# Patient Record
Sex: Female | Born: 1935 | Race: White | Hispanic: No | Marital: Single | Smoking: Former smoker
Health system: Southern US, Community
[De-identification: ages and names within clinical notes are randomized; demographics above are authoritative.]

## PROBLEM LIST (undated history)

## (undated) DIAGNOSIS — R569 Unspecified convulsions: Secondary | ICD-10-CM

## (undated) DIAGNOSIS — M311 Thrombotic microangiopathy: Secondary | ICD-10-CM

## (undated) DIAGNOSIS — J9600 Acute respiratory failure, unspecified whether with hypoxia or hypercapnia: Secondary | ICD-10-CM

## (undated) DIAGNOSIS — T420X1A Poisoning by hydantoin derivatives, accidental (unintentional), initial encounter: Secondary | ICD-10-CM

## (undated) DIAGNOSIS — J189 Pneumonia, unspecified organism: Secondary | ICD-10-CM

## (undated) DIAGNOSIS — I639 Cerebral infarction, unspecified: Secondary | ICD-10-CM

## (undated) DIAGNOSIS — Z931 Gastrostomy status: Secondary | ICD-10-CM

## (undated) DIAGNOSIS — K922 Gastrointestinal hemorrhage, unspecified: Secondary | ICD-10-CM

## (undated) DIAGNOSIS — I959 Hypotension, unspecified: Secondary | ICD-10-CM

## (undated) DIAGNOSIS — I2699 Other pulmonary embolism without acute cor pulmonale: Secondary | ICD-10-CM

## (undated) DIAGNOSIS — M3119 Other thrombotic microangiopathy: Secondary | ICD-10-CM

## (undated) DIAGNOSIS — R413 Other amnesia: Secondary | ICD-10-CM

## (undated) DIAGNOSIS — J181 Lobar pneumonia, unspecified organism: Secondary | ICD-10-CM

## (undated) HISTORY — PX: JEJUNOSTOMY FEEDING TUBE: SUR737

## (undated) HISTORY — PX: APPENDECTOMY: SHX54

## (undated) HISTORY — DX: Other amnesia: R41.3

---

## 2014-05-13 ENCOUNTER — Other Ambulatory Visit (HOSPITAL_COMMUNITY): Payer: Self-pay | Admitting: Internal Medicine

## 2014-05-13 ENCOUNTER — Inpatient Hospital Stay (HOSPITAL_COMMUNITY)
Admission: EM | Admit: 2014-05-13 | Discharge: 2014-05-20 | DRG: 193 | Disposition: A | Discharge status: Skilled Nursing Facility | Payer: PRIVATE HEALTH INSURANCE | Attending: Internal Medicine | Admitting: Internal Medicine

## 2014-05-13 ENCOUNTER — Emergency Department (HOSPITAL_COMMUNITY): Payer: PRIVATE HEALTH INSURANCE

## 2014-05-13 ENCOUNTER — Ambulatory Visit (HOSPITAL_COMMUNITY): Payer: PRIVATE HEALTH INSURANCE

## 2014-05-13 ENCOUNTER — Encounter (HOSPITAL_COMMUNITY): Payer: Self-pay | Admitting: Emergency Medicine

## 2014-05-13 ENCOUNTER — Inpatient Hospital Stay (HOSPITAL_COMMUNITY): Payer: PRIVATE HEALTH INSURANCE

## 2014-05-13 DIAGNOSIS — R404 Transient alteration of awareness: Secondary | ICD-10-CM

## 2014-05-13 DIAGNOSIS — J90 Pleural effusion, not elsewhere classified: Secondary | ICD-10-CM | POA: Diagnosis present

## 2014-05-13 DIAGNOSIS — I69392 Facial weakness following cerebral infarction: Secondary | ICD-10-CM

## 2014-05-13 DIAGNOSIS — E871 Hypo-osmolality and hyponatremia: Secondary | ICD-10-CM | POA: Diagnosis present

## 2014-05-13 DIAGNOSIS — J181 Lobar pneumonia, unspecified organism: Secondary | ICD-10-CM

## 2014-05-13 DIAGNOSIS — I69320 Aphasia following cerebral infarction: Secondary | ICD-10-CM | POA: Diagnosis not present

## 2014-05-13 DIAGNOSIS — Z86711 Personal history of pulmonary embolism: Secondary | ICD-10-CM

## 2014-05-13 DIAGNOSIS — J189 Pneumonia, unspecified organism: Principal | ICD-10-CM | POA: Diagnosis present

## 2014-05-13 DIAGNOSIS — I69351 Hemiplegia and hemiparesis following cerebral infarction affecting right dominant side: Secondary | ICD-10-CM

## 2014-05-13 DIAGNOSIS — R197 Diarrhea, unspecified: Secondary | ICD-10-CM | POA: Diagnosis present

## 2014-05-13 DIAGNOSIS — D1800 Hemangioma unspecified site: Secondary | ICD-10-CM

## 2014-05-13 DIAGNOSIS — T420X1S Poisoning by hydantoin derivatives, accidental (unintentional), sequela: Secondary | ICD-10-CM

## 2014-05-13 DIAGNOSIS — T420X1A Poisoning by hydantoin derivatives, accidental (unintentional), initial encounter: Secondary | ICD-10-CM | POA: Diagnosis present

## 2014-05-13 DIAGNOSIS — E875 Hyperkalemia: Secondary | ICD-10-CM | POA: Diagnosis present

## 2014-05-13 DIAGNOSIS — R401 Stupor: Secondary | ICD-10-CM

## 2014-05-13 DIAGNOSIS — G9340 Encephalopathy, unspecified: Secondary | ICD-10-CM | POA: Diagnosis present

## 2014-05-13 DIAGNOSIS — T420X5A Adverse effect of hydantoin derivatives, initial encounter: Secondary | ICD-10-CM | POA: Diagnosis present

## 2014-05-13 DIAGNOSIS — T420X1D Poisoning by hydantoin derivatives, accidental (unintentional), subsequent encounter: Secondary | ICD-10-CM

## 2014-05-13 DIAGNOSIS — Z66 Do not resuscitate: Secondary | ICD-10-CM | POA: Diagnosis present

## 2014-05-13 DIAGNOSIS — R569 Unspecified convulsions: Secondary | ICD-10-CM

## 2014-05-13 DIAGNOSIS — T420X4D Poisoning by hydantoin derivatives, undetermined, subsequent encounter: Secondary | ICD-10-CM

## 2014-05-13 DIAGNOSIS — R1310 Dysphagia, unspecified: Secondary | ICD-10-CM

## 2014-05-13 DIAGNOSIS — I639 Cerebral infarction, unspecified: Secondary | ICD-10-CM | POA: Diagnosis present

## 2014-05-13 DIAGNOSIS — G40909 Epilepsy, unspecified, not intractable, without status epilepticus: Secondary | ICD-10-CM | POA: Diagnosis present

## 2014-05-13 DIAGNOSIS — R4182 Altered mental status, unspecified: Secondary | ICD-10-CM

## 2014-05-13 DIAGNOSIS — Z79899 Other long term (current) drug therapy: Secondary | ICD-10-CM | POA: Diagnosis not present

## 2014-05-13 HISTORY — DX: Poisoning by hydantoin derivatives, accidental (unintentional), initial encounter: T42.0X1A

## 2014-05-13 HISTORY — DX: Hypotension, unspecified: I95.9

## 2014-05-13 HISTORY — DX: Pneumonia, unspecified organism: J18.9

## 2014-05-13 HISTORY — DX: Cerebral infarction, unspecified: I63.9

## 2014-05-13 HISTORY — DX: Unspecified convulsions: R56.9

## 2014-05-13 HISTORY — DX: Gastrointestinal hemorrhage, unspecified: K92.2

## 2014-05-13 HISTORY — DX: Gastrostomy status: Z93.1

## 2014-05-13 HISTORY — DX: Other pulmonary embolism without acute cor pulmonale: I26.99

## 2014-05-13 LAB — COMPREHENSIVE METABOLIC PANEL
ALBUMIN: 2.8 g/dL — AB (ref 3.5–5.2)
ALT: 11 U/L (ref 0–35)
AST: 23 U/L (ref 0–37)
Alkaline Phosphatase: 377 U/L — ABNORMAL HIGH (ref 39–117)
Anion gap: 9 (ref 5–15)
BUN: 22 mg/dL (ref 6–23)
CO2: 30 meq/L (ref 19–32)
CREATININE: 0.59 mg/dL (ref 0.50–1.10)
Calcium: 8.6 mg/dL (ref 8.4–10.5)
Chloride: 85 mEq/L — ABNORMAL LOW (ref 96–112)
GFR calc Af Amer: >90 mL/min (ref >90)
GFR, EST NON AFRICAN AMERICAN: 87 mL/min — AB (ref >90)
Glucose, Bld: 115 mg/dL — ABNORMAL HIGH (ref 70–99)
Potassium: 5.5 mEq/L — ABNORMAL HIGH (ref 3.7–5.3)
SODIUM: 124 meq/L — AB (ref 137–147)
Total Bilirubin: 0.3 mg/dL (ref 0.3–1.2)
Total Protein: 6.8 g/dL (ref 6.0–8.3)

## 2014-05-13 LAB — URINALYSIS, ROUTINE W REFLEX MICROSCOPIC
Bilirubin Urine: NEGATIVE
Glucose, UA: NEGATIVE mg/dL
Hgb urine dipstick: NEGATIVE
Ketones, ur: NEGATIVE mg/dL
LEUKOCYTES UA: NEGATIVE
NITRITE: NEGATIVE
PH: 7 (ref 5.0–8.0)
Protein, ur: NEGATIVE mg/dL
Specific Gravity, Urine: 1.017 (ref 1.005–1.030)
UROBILINOGEN UA: 0.2 mg/dL (ref 0.0–1.0)

## 2014-05-13 LAB — CBC WITH DIFFERENTIAL/PLATELET
BASOS ABS: 0 10*3/uL (ref 0.0–0.1)
BASOS PCT: 0 % (ref 0–1)
Eosinophils Absolute: 0.1 10*3/uL (ref 0.0–0.7)
Eosinophils Relative: 1 % (ref 0–5)
HCT: 34.1 % — ABNORMAL LOW (ref 36.0–46.0)
Hemoglobin: 11.6 g/dL — ABNORMAL LOW (ref 12.0–15.0)
LYMPHS PCT: 27 % (ref 12–46)
Lymphs Abs: 2.7 10*3/uL (ref 0.7–4.0)
MCH: 28.5 pg (ref 26.0–34.0)
MCHC: 34 g/dL (ref 30.0–36.0)
MCV: 83.8 fL (ref 78.0–100.0)
MONO ABS: 1.3 10*3/uL — AB (ref 0.1–1.0)
Monocytes Relative: 13 % — ABNORMAL HIGH (ref 3–12)
NEUTROS ABS: 5.7 10*3/uL (ref 1.7–7.7)
Neutrophils Relative %: 59 % (ref 43–77)
PLATELETS: 436 10*3/uL — AB (ref 150–400)
RBC: 4.07 MIL/uL (ref 3.87–5.11)
RDW: 19.6 % — AB (ref 11.5–15.5)
WBC: 9.9 10*3/uL (ref 4.0–10.5)

## 2014-05-13 LAB — OSMOLALITY: Osmolality: 260 mOsm/kg — ABNORMAL LOW (ref 275–300)

## 2014-05-13 LAB — RAPID URINE DRUG SCREEN, HOSP PERFORMED
Amphetamines: NOT DETECTED
Barbiturates: NOT DETECTED
Benzodiazepines: NOT DETECTED
Cocaine: NOT DETECTED
OPIATES: NOT DETECTED
Tetrahydrocannabinol: NOT DETECTED

## 2014-05-13 LAB — I-STAT TROPONIN, ED: TROPONIN I, POC: 0 ng/mL (ref 0.00–0.08)

## 2014-05-13 LAB — PHENYTOIN LEVEL, TOTAL: PHENYTOIN LVL: 35.5 ug/mL — AB (ref 10.0–20.0)

## 2014-05-13 LAB — CBG MONITORING, ED: GLUCOSE-CAPILLARY: 108 mg/dL — AB (ref 70–99)

## 2014-05-13 LAB — SODIUM, URINE, RANDOM: SODIUM UR: 72 meq/L

## 2014-05-13 LAB — MRSA PCR SCREENING: MRSA by PCR: NEGATIVE

## 2014-05-13 LAB — LACTIC ACID, PLASMA: LACTIC ACID, VENOUS: 1.2 mmol/L (ref 0.5–2.2)

## 2014-05-13 LAB — VALPROIC ACID LEVEL

## 2014-05-13 LAB — PROCALCITONIN: Procalcitonin: <0.1 ng/mL

## 2014-05-13 MED ORDER — VALPROIC ACID 250 MG/5ML PO SYRP
500.0000 mg | ORAL_SOLUTION | Freq: Every day | ORAL | Status: DC
  Administered 2014-05-13 – 2014-05-16 (×4): 500 mg
  Filled 2014-05-13 (×6): qty 10

## 2014-05-13 MED ORDER — ONDANSETRON HCL 4 MG/2ML IJ SOLN: 4.0000 mg | Freq: Four times a day (QID) | INTRAMUSCULAR | Status: DC | PRN

## 2014-05-13 MED ORDER — SODIUM CHLORIDE 0.9 % IV SOLN: INTRAVENOUS | Status: AC

## 2014-05-13 MED ORDER — LORAZEPAM 1 MG PO TABS
1.0000 mg | ORAL_TABLET | Freq: Four times a day (QID) | ORAL | Status: DC | PRN
  Administered 2014-05-14: 1 mg
  Filled 2014-05-13: qty 1

## 2014-05-13 MED ORDER — ACETAMINOPHEN 650 MG RE SUPP: 650.0000 mg | Freq: Four times a day (QID) | RECTAL | Status: DC | PRN

## 2014-05-13 MED ORDER — CEFEPIME HCL 1 G IJ SOLR
1.0000 g | INTRAMUSCULAR | Status: DC
  Administered 2014-05-13 – 2014-05-14 (×2): 1 g via INTRAVENOUS
  Administered 2014-05-15: 22:00:00 via INTRAVENOUS
  Administered 2014-05-16 – 2014-05-18 (×3): 1 g via INTRAVENOUS
  Filled 2014-05-13 (×7): qty 1

## 2014-05-13 MED ORDER — ACETAMINOPHEN 325 MG PO TABS: 650.0000 mg | ORAL_TABLET | Freq: Four times a day (QID) | ORAL | Status: DC | PRN

## 2014-05-13 MED ORDER — VANCOMYCIN HCL 500 MG IV SOLR
500.0000 mg | Freq: Two times a day (BID) | INTRAVENOUS | Status: DC
  Administered 2014-05-13 – 2014-05-16 (×6): 500 mg via INTRAVENOUS
  Filled 2014-05-13 (×8): qty 500

## 2014-05-13 MED ORDER — SODIUM CHLORIDE 0.9 % IV BOLUS (SEPSIS)
1000.0000 mL | Freq: Once | INTRAVENOUS | Status: AC
  Administered 2014-05-13: 1000 mL via INTRAVENOUS

## 2014-05-13 MED ORDER — APIXABAN 5 MG PO TABS
5.0000 mg | ORAL_TABLET | Freq: Two times a day (BID) | ORAL | Status: DC
  Administered 2014-05-13 – 2014-05-16 (×6): 5 mg
  Filled 2014-05-13 (×7): qty 1

## 2014-05-13 MED ORDER — LACOSAMIDE 50 MG PO TABS
150.0000 mg | ORAL_TABLET | Freq: Two times a day (BID) | ORAL | Status: DC
  Administered 2014-05-13 – 2014-05-20 (×14): 150 mg
  Filled 2014-05-13 (×14): qty 3

## 2014-05-13 MED ORDER — LORAZEPAM 2 MG/ML IJ SOLN
1.0000 mg | INTRAMUSCULAR | Status: DC | PRN
  Administered 2014-05-16: 1 mg via INTRAVENOUS
  Filled 2014-05-13: qty 1

## 2014-05-13 MED ORDER — HEPARIN SODIUM (PORCINE) 5000 UNIT/ML IJ SOLN: 5000.0000 [IU] | Freq: Three times a day (TID) | INTRAMUSCULAR | Status: DC

## 2014-05-13 MED ORDER — ONDANSETRON HCL 4 MG PO TABS: 4.0000 mg | ORAL_TABLET | Freq: Four times a day (QID) | ORAL | Status: DC | PRN

## 2014-05-13 MED ORDER — SODIUM CHLORIDE 0.9 % IV SOLN
INTRAVENOUS | Status: DC
  Administered 2014-05-13 – 2014-05-18 (×7): via INTRAVENOUS

## 2014-05-13 MED ORDER — FAMOTIDINE 20 MG PO TABS
20.0000 mg | ORAL_TABLET | Freq: Two times a day (BID) | ORAL | Status: DC
  Administered 2014-05-13 – 2014-05-20 (×14): 20 mg
  Filled 2014-05-13 (×15): qty 1

## 2014-05-13 MED ORDER — ONDANSETRON HCL 4 MG PO TABS: 4.0000 mg | ORAL_TABLET | ORAL | Status: DC | PRN

## 2014-05-13 NOTE — Progress Notes (Signed)
Bedside routine EEG completed.  Results pending.

## 2014-05-13 NOTE — ED Notes (Signed)
IV team to bedside. 

## 2014-05-13 NOTE — Consult Note (Signed)
NEURO HOSPITALIST CONSULT NOTE    Reason for Consult: AMS in setting of supra therapeutic Dilantin and Hyponatremia  HPI:                                                                                                                                          Tanya White is an 78 y.o. female who resides at [facility].  History obtained from Niece. Per niece, two months ago patient suffered a seizure and was placed on AED. Two weeks later patient was noted to have a prolonged seizure and was again hospitalized.  At time of discharge she was placed on Vimpat , Depakote and Dilantin. Patient was discharged to [facility]. She was her baseline up until this past Thursday (Baseline = able to take two steps with full assist, right hemiplegia, dysarthria and some aphasia per niece).  On Friday she was noted to be non verbal and staring forward. MD at nursing home was concerned for seizure and ordered a EEG and CT of head to be done the following Monday.  Patient was picked up by EMS to go [facility] when they were concerned about patients breathing.  She was brought to [facility] ED.  Currently she is obtunded and only responds by localization to noxious stimuli.   Of note.  Depakote had been stopped on Friday due to concern it may have been causing her drowsiness.  Current Dilantin 53 and current NA+ 124  Past Medical History  Diagnosis Date  . Seizures   . Hypotension   . Pneumonia   . Stroke   . GI bleed   . Pulmonary embolism   . Gastrostomy tube in place     History reviewed. No pertinent past surgical history.   Family History: Unable to obtain  Social History:  has no tobacco, alcohol, and drug history on file.  Allergies  Allergen Reactions  . Aspirin Other (See Comments)    REACTION: unknown  . Codeine Other (See Comments)    REACTION: unknown  . Hctz [Hydrochlorothiazide] Other (See Comments)    REACTION: unknown  . Hydralazine  Other (See Comments)    REACTION: unknown  . Lipitor [Atorvastatin] Other (See Comments)    REACTION: unknown  . Reserpine Other (See Comments)    REACTION: unknown  . Tramadol Other (See Comments)    REACTION: unknown    MEDICATIONS:  No current facility-administered medications for this encounter.   Current Outpatient Prescriptions  Medication Sig Dispense Refill  . acetaminophen (TYLENOL) 325 MG tablet 650 mg by PEG Tube route every 6 (six) hours as needed for mild pain or fever.      Marland Kitchen albuterol (PROVENTIL) (2.5 MG/3ML) 0.083% nebulizer solution Take 2.5 mg by nebulization every 2 (two) hours as needed for wheezing or shortness of breath.      . Amino Acids-Protein Hydrolys (FEEDING SUPPLEMENT, PRO-STAT SUGAR FREE 64,) LIQD 30 mLs by PEG Tube route 2 (two) times daily.      Marland Kitchen apixaban (ELIQUIS) 5 MG TABS tablet 5 mg by PEG Tube route 2 (two) times daily.      . diphenhydrAMINE (BENADRYL) 12.5 MG/5ML liquid 25 mg by PEG Tube route every 6 (six) hours as needed (for pruritus and/or insomnia).      . divalproex (DEPAKOTE) 500 MG DR tablet 500 mg by PEG Tube route 2 (two) times daily.      . famotidine (PEPCID) 20 MG tablet 20 mg by PEG Tube route 2 (two) times daily.      . ferrous sulfate 325 (65 FE) MG tablet 325 mg by PEG Tube route daily.      . furosemide (LASIX) 40 MG tablet 40 mg by PEG Tube route daily as needed (for leg swelling.  Give PRN potassium when Lasix is given).      . Lacosamide (VIMPAT) 150 MG TABS 150 mg by PEG Tube route 2 (two) times daily.      Marland Kitchen LORazepam (ATIVAN) 1 MG tablet 1 mg by PEG Tube route every 6 (six) hours as needed (for agitation).      . LORazepam (ATIVAN) 2 MG tablet 2 mg by PEG Tube route every 6 (six) hours as needed for anxiety.      . magnesium oxide (MAG-OX) 400 MG tablet 400 mg by PEG Tube route 3 (three) times daily.      .  metoprolol tartrate (LOPRESSOR) 25 MG tablet 25 mg by PEG Tube route 2 (two) times daily.      . Multiple Vitamin (MULTIVITAMIN) LIQD 5 mLs by PEG Tube route daily.      . Nutritional Supplements (FEEDING SUPPLEMENT, JEVITY 1.5 CAL,) LIQD Place 1,000 mLs into feeding tube 3 (three) times daily as needed (given after meals, only if each meal intake is less than 50%).      Marland Kitchen ondansetron (ZOFRAN) 4 MG tablet 4 mg by PEG Tube route every 4 (four) hours as needed for nausea.      . potassium chloride 20 MEQ/15ML (10%) solution 20 mEq by PEG Tube route daily as needed (Adminiter with PRN Lasix.).      Marland Kitchen PRESCRIPTION MEDICATION Apply 1 mg topically every 6 (six) hours as needed (ATIVAN GEL for agitation).          ROS:  History obtained from unobtainable from patient due to mental status   Blood pressure 102/49, pulse 80, temperature 97.4 F (36.3 C), temperature source Axillary, resp. rate 26, height 5\' 5"  (1.651 m), weight 61.236 kg (135 lb), SpO2 100.00%.   Neurologic Examination:                                                                                                      General: obtunded Mental Status: Only responds to noxious stimuli and does localize to pain using her left arm>right Cranial Nerves: II: Discs flat bilaterally; no blink to threat bilaterally. , pupils equal, round, reactive to light and accommodation III,IV, VI: doll's response and corneal reflex intact V,VII: face with right facial droop, winces to pain bilaterally VIII: unable to assess IX,X: cough reflex present XI: bilateral shoulder shrug XII: tongue appears midline but unable to assess tongue protrusion due to mental status  Motor: Right : Upper extremity   3/5    Left:     Upper extremity   5/5  Lower extremity   2/5     Lower extremity   4/5 Tone and bulk:normal tone  throughout; no atrophy noted Sensory: withdraws from pain in all 4 extremites Deep Tendon Reflexes:  Right: Upper Extremity   Left: Upper extremity   biceps (C-5 to C-6) 2/4   biceps (C-5 to C-6) 2/4 tricep (C7) 2/4    triceps (C7) 2/4 Brachioradialis (C6) 2/4  Brachioradialis (C6) 2/4  Lower Extremity Lower Extremity  quadriceps (L-2 to L-4) 2/4   quadriceps (L-2 to L-4) 2/4 Achilles (S1) 2/4   Achilles (S1) 2/4  Plantars: Mute bilaterally Cerebellar: Unable to assess Gait: not tested CV: pulses palpable throughout    Lab Results: Basic Metabolic Panel:  Recent Labs Lab 05/13/14 1339  NA 124*  K 5.5*  CL 85*  CO2 30  GLUCOSE 115*  BUN 22  CREATININE 0.59  CALCIUM 8.6    Liver Function Tests:  Recent Labs Lab 05/13/14 1339  AST 23  ALT 11  ALKPHOS 377*  BILITOT 0.3  PROT 6.8  ALBUMIN 2.8*   No results found for this basename: LIPASE, AMYLASE,  in the last 168 hours No results found for this basename: AMMONIA,  in the last 168 hours  CBC:  Recent Labs Lab 05/13/14 1339  WBC 9.9  NEUTROABS 5.7  HGB 11.6*  HCT 34.1*  MCV 83.8  PLT 436*    Cardiac Enzymes: No results found for this basename: CKTOTAL, CKMB, CKMBINDEX, TROPONINI,  in the last 168 hours  Lipid Panel: No results found for this basename: CHOL, TRIG, HDL, CHOLHDL, VLDL, LDLCALC,  in the last 168 hours  CBG:  Recent Labs Lab 05/13/14 [place]*    Microbiology: No results found for this or any previous visit.  Coagulation Studies: No results found for this basename: LABPROT, INR,  in the last 72 hours  Imaging: Ct Head Wo Contrast  05/13/2014   CLINICAL DATA:  Altered mental status.  EXAM: CT HEAD WITHOUT CONTRAST  TECHNIQUE: Contiguous axial images were obtained from  the base of the skull through the vertex without intravenous contrast.  COMPARISON:  03/09/2014  FINDINGS: Area of encephalomalacia in the posterior left parietal lobe compatible with old infarct,  stable. Previously seen subdural hematomas have resolved. Mild chronic microvascular changes throughout the deep white matter. No acute hemorrhage or infarct. No hydrocephalus.  No acute calvarial abnormality. Visualized paranasal sinuses and mastoids clear. Orbital soft tissues unremarkable.  IMPRESSION: Fold of posterior left MCA infarct with encephalomalacia.  Mild chronic microvascular changes.  No acute intracranial abnormality.   Electronically Signed   By: Rolm Baptise M.D.   On: 05/13/2014 14:17   Dg Chest Port 1 View  05/13/2014   CLINICAL DATA:  Altered mental status  EXAM: PORTABLE CHEST - 1 VIEW  COMPARISON:  Portable exam 1408 hr compared to 02/17/2014  FINDINGS: Enlargement of cardiac silhouette.  Atherosclerotic calcification aorta.  Pulmonary vascularity normal.  Slight prominent RIGHT peritracheal soft tissues though this may be slightly accentuated versus previous study due to rotation to the LEFT.  Atelectasis versus consolidation of LEFT lower lobe.  Accompanying LEFT pleural effusion as well.  Ovoid nodular density versus superimposed clothing artifact at RIGHT base 19 x 14 mm.  RIGHT lung otherwise clear.  No pneumothorax.  Bones demineralized.  IMPRESSION: Enlargement of cardiac silhouette.  Atelectasis versus consolidation in LEFT lower lobe with associated LEFT pleural effusion.  Questionable nodular density RIGHT lung base versus superimposed artifact; followup upright PA and lateral chest radiographs recommended to further assess ; if patient is unable to perform upright imaging, an upright AP radiograph would then be recommended.   Electronically Signed   By: Lavonia Dana M.D.   On: 05/13/2014 14:25    Etta Quill PA-C [facility] [phone]  05/13/2014, 5:05 PM  Patient seen and examined.  Clinical course and management discussed.  Necessary edits performed.  I agree with the above.  Assessment and plan of care developed and discussed below.      Assessment/Plan: 78 YO female with AMS in setting of supra therapeutic Dilantin (corrected level of 53) and sodium of 124.  In addition CXR shows possible left consolidation.  Mental status likely secondary to these factors but this patient was recently hospitalized for status epilepticus and some of her medications were recently withdrawn.   Would like to rule out the possibility of a coexistent subclinical status epilepticus.  Head CT reviewed and shows no acute changes.  Old left MCA infarct apparent.    Recommendations: 1.  Hold Dilantin 2.  EEG tonight.  Technician notified 3.  Daily Dilantin levels 4.  Sodium correction 5.  Depakote and Vimpat to be restarted at preadmission doses 6.  Valproic acid level in AM on 9/30 7.  PRN Ativan for seizure activity   Alexis Goodell, MD [facility] [phone]  05/13/2014  6:01 PM

## 2014-05-13 NOTE — ED Notes (Signed)
Patient has wound on sacrum bandage applied prior to arrival to ED.

## 2014-05-13 NOTE — ED Notes (Signed)
EEG at bedside.

## 2014-05-13 NOTE — ED Notes (Signed)
EEG completed.

## 2014-05-13 NOTE — ED Notes (Signed)
EEG currently still in process.

## 2014-05-13 NOTE — H&P (Addendum)
History and Physical       Hospital Admission Note Date: 05/13/2014  Patient name: Tanya White Medical record number: [id number] Date of birth: 05/30/1936 Age: 78 y.o. Gender: female  PCP: [name], MD    Chief Complaint:  Confused, minimally responsive  HPI: Patient is a 78 year old female with known history of seizure disorder on multiple seizure medications, history of left MCA CVA,  dysphagia with PEG tube was recently admitted at [facility], discharged on 9/14 when she was admitted with status epilepticus. During the hospitalization, patient's seizure medications were uptitrated. She was found to have dysphagia and failed swallow screen, hence PEG tube was placed on 9/4. Blood cultures and urine cultures were negative. CTA chest on 9/3 showed a 2 small right lower lobe pulmonary embolus, echocardiogram showed small thrombus in the right atrium, patient was placed on Eliquis. At the facility, patient was scheduled to have CT of the head and EEG today due to cognitive dysfunction, the patient was brought from the nursing facility by the EMS once found minimally responsive. At the time of the encounter, patient opens eyes to pain otherwise unable to provide any history. Hospitalist service was requested for admission. No family member is present at the bedside.   Review of Systems:  Unable to obtain from the patient due to her mental status   Past Medical History: Past Medical History  Diagnosis Date  . Seizures   . Hypotension   . Pneumonia   . Stroke   . GI bleed   . Pulmonary embolism   . Gastrostomy tube in place    History reviewed. No pertinent past surgical history.  Medications: Prior to Admission medications   Not on File    Allergies:   Allergies  Allergen Reactions  . Aspirin Other (See Comments)    REACTION: unknown  . Codeine Other (See Comments)    REACTION: unknown  . Hctz  [Hydrochlorothiazide] Other (See Comments)    REACTION: unknown  . Hydralazine Other (See Comments)    REACTION: unknown  . Lipitor [Atorvastatin] Other (See Comments)    REACTION: unknown  . Reserpine Other (See Comments)    REACTION: unknown  . Tramadol Other (See Comments)    REACTION: unknown    Social History:  has no tobacco, alcohol, and drug history on file.  Family History: No family history on file.  Physical Exam: Blood pressure 127/61, pulse 71, temperature 97.4 F (36.3 C), temperature source Axillary, resp. rate 18, height 5\' 5"  (1.651 m), weight 61.236 kg (135 lb), SpO2 99.00%. General: Somnolent opens eyes to pain HEENT: normocephalic, atraumatic, anicteric sclera, pink conjunctiva, pupils equal and reactive to light and accomodation, oropharynx clear Neck: supple, no masses or lymphadenopathy, no goiter, no bruits  Heart: Regular rate and rhythm, without murmurs, rubs or gallops. Lungs: Decreased breath sounds at the bases, no wheezing Abdomen: Soft, nontender, nondistended, positive bowel sounds, PEG tube in place Extremities: No clubbing, cyanosis or edema with positive pedal pulses. Neuro: Does not follow commands Psych: Somnolent, opens eyes to pain Skin: no rashes or lesions, warm and dry   LABS on Admission:  Basic Metabolic Panel:  Recent Labs Lab 05/13/14 1339  NA 124*  K 5.5*  CL 85*  CO2 30  GLUCOSE 115*  BUN 22  CREATININE 0.59  CALCIUM 8.6   Liver Function Tests:  Recent Labs Lab 05/13/14 1339  AST 23  ALT 11  ALKPHOS 377*  BILITOT 0.3  PROT 6.8  ALBUMIN 2.8*  No results found for this basename: LIPASE, AMYLASE,  in the last 168 hours No results found for this basename: AMMONIA,  in the last 168 hours CBC:  Recent Labs Lab 05/13/14 1339  WBC 9.9  NEUTROABS 5.7  HGB 11.6*  HCT 34.1*  MCV 83.8  PLT 436*   Cardiac Enzymes: No results found for this basename: CKTOTAL, CKMB, CKMBINDEX, TROPONINI,  in the last 168  hours BNP: No components found with this basename: POCBNP,  CBG:  Recent Labs Lab 05/13/14 1402  GLUCAP 108*     Radiological Exams on Admission: Ct Head Wo Contrast  05/13/2014   CLINICAL DATA:  Altered mental status.  EXAM: CT HEAD WITHOUT CONTRAST  TECHNIQUE: Contiguous axial images were obtained from the base of the skull through the vertex without intravenous contrast.  COMPARISON:  03/09/2014  FINDINGS: Area of encephalomalacia in the posterior left parietal lobe compatible with old infarct, stable. Previously seen subdural hematomas have resolved. Mild chronic microvascular changes throughout the deep white matter. No acute hemorrhage or infarct. No hydrocephalus.  No acute calvarial abnormality. Visualized paranasal sinuses and mastoids clear. Orbital soft tissues unremarkable.  IMPRESSION: Fold of posterior left MCA infarct with encephalomalacia.  Mild chronic microvascular changes.  No acute intracranial abnormality.   Electronically Signed   By: Rolm Baptise M.D.   On: 05/13/2014 14:17   Dg Chest Port 1 View  05/13/2014   CLINICAL DATA:  Altered mental status  EXAM: PORTABLE CHEST - 1 VIEW  COMPARISON:  Portable exam 1408 hr compared to 02/17/2014  FINDINGS: Enlargement of cardiac silhouette.  Atherosclerotic calcification aorta.  Pulmonary vascularity normal.  Slight prominent RIGHT peritracheal soft tissues though this may be slightly accentuated versus previous study due to rotation to the LEFT.  Atelectasis versus consolidation of LEFT lower lobe.  Accompanying LEFT pleural effusion as well.  Ovoid nodular density versus superimposed clothing artifact at RIGHT base 19 x 14 mm.  RIGHT lung otherwise clear.  No pneumothorax.  Bones demineralized.  IMPRESSION: Enlargement of cardiac silhouette.  Atelectasis versus consolidation in LEFT lower lobe with associated LEFT pleural effusion.  Questionable nodular density RIGHT lung base versus superimposed artifact; followup upright PA and  lateral chest radiographs recommended to further assess ; if patient is unable to perform upright imaging, an upright AP radiograph would then be recommended.   Electronically Signed   By: Lavonia Dana M.D.   On: 05/13/2014 14:25    Assessment/Plan Principal Problem:   Acute encephalopathy unclear of the baseline, possibly worsened due to hyponatremia vs seizure activity vs Dilantin toxicity vs Left lower lobe consolidation  - Will admit to step down, obtain lactic acid, procalcitonin, pharmacy consult to reconcile her medications  - Per the nursing home records, patient is on Depakote, vimpat, Dilantin  - Patient has a prior history of CVA, will obtain EEG, discussed with neurology (Dr Doy Mince) for consult  - Keep n.p.o.  Active Problems:   Hyponatremia - Placed on IV fluids, obtain serum osmolarity, urine osmolarity, urine sodium - Repeat BMET  Left lower lobe pneumonia - Obtain urine legionella antigen, urine strept Ag, blood cultures, procalcitonin - Placed on IV vancomycin and cefepime  History of  Seizure: Currently with Dilantin toxicity - Will defer seizure medications to neurology    Hyperkalemia - Repeat BMET, will give one dose of Kayexalate - patient was receiving 20 mEq potassium daily outpatient, will discontinue     Dilantin toxicity - Hold the Dilantin, repeat level in am  Dysphagia - Patient is on Jevity 1.5, will place nutrition consult for tube feeding    history of recent PE, questionable cardiac thrombus per transfer records  - will need to restart Eliquis via Gtube  - Repeat 2-D echocardiogram  DVT prophylaxis: eliquis  CODE STATUS: DNR  Family Communication: no family members are present   Further plan will depend as patient's clinical course evolves and further radiologic and laboratory data become available.   Time Spent on Admission: 1 hour   Oprah Camarena M.D. [facility] 05/13/2014, 4:33 PM Pager: [phone]  If 7PM-7AM, please  contact night-coverage [email or web address] Password [email or web address]

## 2014-05-13 NOTE — ED Provider Notes (Signed)
CSN: [id number]     Arrival date & time 05/13/14  1331 History   First MD Initiated Contact with Patient 05/13/14 1336     Chief Complaint  Patient presents with  . Altered Mental Status     (Consider location/radiation/quality/duration/timing/severity/associated sxs/prior Treatment) Patient is a 78 y.o. female presenting with altered mental status. The history is provided by the EMS personnel (family).  Altered Mental Status Presenting symptoms: partial responsiveness   Severity:  Severe Most recent episode:  Today Episode history:  Continuous Duration:  2 weeks Timing:  Constant Progression:  Unchanged Chronicity:  Recurrent Context: nursing home resident    Pt is a 78 yo AAF with a PMH of seizure disorder (on depakote, dilantin and lacosamide), left MCA stroke (on eliquis), pneumonia s/p left thoracentesis for parapneumonic effusion and severe protein malnutrition who presents by EMS with altered mental status. Patient was at her nursing facility and EMS arrived to pick her up and found her to be minimally responsive (open eyes to pain). Patient non verbal. O2 sat 95% on 3L [place]. Given 0.4mg  narcan with no response. I spoke with family who states that the patient appears near her baseline for "the last couple of weeks." They report that she has been declining mentally for since her first seizure on June 2015.  Family believes patient has been getting her AEDs as prescribed since she has been at her nursing facility.   Past Medical History  Diagnosis Date  . Seizures   . Hypotension   . Pneumonia   . Stroke   . GI bleed   . Pulmonary embolism   . Gastrostomy tube in place    History reviewed. No pertinent past surgical history. No family history on file. History  Substance Use Topics  . Smoking status: Not on file  . Smokeless tobacco: Not on file  . Alcohol Use: Not on file   OB History   Grav Para Term Preterm Abortions TAB SAB Ect Mult Living                 Review  of Systems  Unable to perform ROS: Mental status change      Allergies  Aspirin; Codeine; Hctz; Hydralazine; Lipitor; Reserpine; and Tramadol  Home Medications   Prior to Admission medications   Not on File   BP 137/68  Pulse 80  Temp(Src) 97.4 F (36.3 C) (Axillary)  Resp 19  Ht 5\' 5"  (1.651 m)  Wt 135 lb (61.236 kg)  BMI 22.47 kg/m2  SpO2 100% Physical Exam  Constitutional: She appears well-developed. No distress.  No purposeful movements, snoring, no agonal respirations  HENT:  Head: Normocephalic and atraumatic.  Eyes: Pupils are equal, round, and reactive to light.  Pupils 46mm and sluggish  Neck: Neck supple. No JVD present.  Cardiovascular: Normal rate, regular rhythm and normal heart sounds.   No murmur heard. Pulmonary/Chest: She has decreased breath sounds in the left middle field and the left lower field. She has no wheezes.  Transmitted upper airway noises throughout chest  Abdominal: There is no tenderness. There is no rebound.  G tube in place with no surrounding erythema or purulence, abdomen soft, nondistended, nontender.  Neurological: She displays no tremor. She displays no seizure activity. She displays no Babinski's sign on the right side. She displays no Babinski's sign on the left side.  Reflex Scores:      Patellar reflexes are 2+ on the right side and 2+ on the left side. Opens eyes to  sternal rub, does not follow command or vocalize. Gait deferred  Skin: Skin is warm and dry. No rash noted.    ED Course  Procedures (including critical care time) Angiocath insertion Performed by: Gustavus Bryant  Consent: Verbal consent obtained. Risks and benefits: risks, benefits and alternatives were discussed Time out: Immediately prior to procedure a "time out" was called to verify the correct patient, procedure, equipment, support staff and site/side marked as required.  Preparation: Patient was prepped and draped in the usual sterile fashion.  Vein  Location: Right upper arm  Ultrasound Guided  Gauge: 18  Normal blood return and flush without difficulty Patient tolerance: Patient tolerated the procedure well with no immediate complications.    Labs Review Labs Reviewed  CBC WITH DIFFERENTIAL - Abnormal; Notable for the following:    Hemoglobin 11.6 (*)    HCT 34.1 (*)    RDW 19.6 (*)    Platelets 436 (*)    Monocytes Relative 13 (*)    Monocytes Absolute 1.3 (*)    All other components within normal limits  COMPREHENSIVE METABOLIC PANEL - Abnormal; Notable for the following:    Sodium 124 (*)    Potassium 5.5 (*)    Chloride 85 (*)    Glucose, Bld 115 (*)    Albumin 2.8 (*)    Alkaline Phosphatase 377 (*)    GFR calc non Af Amer 87 (*)    All other components within normal limits  VALPROIC ACID LEVEL - Abnormal; Notable for the following:    Valproic Acid Lvl <10.0 (*)    All other components within normal limits  PHENYTOIN LEVEL, TOTAL - Abnormal; Notable for the following:    Phenytoin Lvl 35.5 (*)    All other components within normal limits  CBG MONITORING, ED - Abnormal; Notable for the following:    Glucose-Capillary 108 (*)    All other components within normal limits  URINALYSIS, ROUTINE W REFLEX MICROSCOPIC  URINE RAPID DRUG SCREEN (HOSP PERFORMED)  I-STAT TROPOININ, ED    Imaging Review  EXAM: XR CHEST PORTABLE ACCESSION NO. [id number] UN STUDY DATE: 03/22/14 at 11:54 AM. INDICATION: Patient is a 78 year-old F; - , LINE CHECK (CATHETER VASCULAR FIT),  COMPARISON: Images from left subclavian line placement, 03/12/14; chest radiographs, 03/11/14, and earlier. CONCLUSIONS: 1. Interval retraction of left subclavian central venous catheter, with tip now at mid brachiocephalic vein. 2. No pneumothoraces or sizable effusions. Cardiomediastinal silhouette unchanged. 3. Improved lung inflation since prior chest radiograph, and decrease in bibasilar opacities, likely atelectasis. Dr. Cato Mulligan was notified  of the above findings at 13:15:36 on 03/22/14 by Dr. Julio Sicks via telephone.    DG Chest Port 1 View (Final result)  Result time: 05/13/14 14:25:30    Procedure changed from [facility] Chest Portable 2 Views    Final result by Rad Results In Interface (05/13/14 14:25:30)    Narrative:   CLINICAL DATA: Altered mental status  EXAM: PORTABLE CHEST - 1 VIEW  COMPARISON: Portable exam 1408 hr compared to 02/17/2014  FINDINGS: Enlargement of cardiac silhouette.  Atherosclerotic calcification aorta.  Pulmonary vascularity normal.  Slight prominent RIGHT peritracheal soft tissues though this may be slightly accentuated versus previous study due to rotation to the LEFT.  Atelectasis versus consolidation of LEFT lower lobe.  Accompanying LEFT pleural effusion as well.  Ovoid nodular density versus superimposed clothing artifact at RIGHT base 19 x 14 mm.  RIGHT lung otherwise clear.  No pneumothorax.  Bones demineralized.  IMPRESSION: Enlargement of  cardiac silhouette.  Atelectasis versus consolidation in LEFT lower lobe with associated LEFT pleural effusion.  Questionable nodular density RIGHT lung base versus superimposed artifact; followup upright PA and lateral chest radiographs recommended to further assess ; if patient is unable to perform upright imaging, an upright AP radiograph would then be recommended.   Electronically Signed By: Lavonia Dana M.D. On: 05/13/2014 14:25    Ct Head Wo Contrast  05/13/2014   CLINICAL DATA:  Altered mental status.  EXAM: CT HEAD WITHOUT CONTRAST  TECHNIQUE: Contiguous axial images were obtained from the base of the skull through the vertex without intravenous contrast.  COMPARISON:  03/09/2014  FINDINGS: Area of encephalomalacia in the posterior left parietal lobe compatible with old infarct, stable. Previously seen subdural hematomas have resolved. Mild chronic microvascular changes throughout the deep white matter. No acute  hemorrhage or infarct. No hydrocephalus.  No acute calvarial abnormality. Visualized paranasal sinuses and mastoids clear. Orbital soft tissues unremarkable.  IMPRESSION: Fold of posterior left MCA infarct with encephalomalacia.  Mild chronic microvascular changes.  No acute intracranial abnormality.   Electronically Signed   By: Rolm Baptise M.D.   On: 05/13/2014 14:17     EKG Interpretation   Date/Time:  Monday May 13 2014 13:34:18 EDT Ventricular Rate:  79 PR Interval:  154 QRS Duration: 107 QT Interval:  398 QTC Calculation: 456 R Axis:   -177 Text Interpretation:  Sinus rhythm Rightward axis Probable right  ventricular hypertrophy Nonspecific T wave abnormality No previous tracing  Confirmed by Ashok Cordia  MD, Lennette Bihari ([id number]) on 05/13/2014 1:55:58 PM      MDM   Final diagnoses:  Hyponatremia  Stupor   79 yo F with a pmh of seizure d/o and CVA who present with AMS. Opens eyes to painful stimulus. Does not follow commands or vocalize. Spontaneous respirations. O2 sats >95% on 3L [place]. CT head obtained and is negative for acute abnormality, but reveals evidence of prior infarct. CXR with cardiomegaly and left pleural effusion. FSG 108. HB 11.6.  Na 124 (in Care Everywhere pt's Na 140 on 04/01/14).  Dilantin level high. Depakote level low. Will have patient admitted to Hospitalist service stepdown unit for AMS with hyponatremia.  Patient transitioned to 2L [place] with O2 sat 98%.  Case discussed with Dr. Ashok Cordia.  Gustavus Bryant, MD      Gustavus Bryant, MD 05/13/14 [phone]

## 2014-05-13 NOTE — Progress Notes (Signed)
ANTIBIOTIC CONSULT NOTE - INITIAL  Pharmacy Consult for vancomycin and cefepime Indication: pneumonia  Allergies  Allergen Reactions  . Aspirin Other (See Comments)    REACTION: unknown  . Codeine Other (See Comments)    REACTION: unknown  . Hctz [Hydrochlorothiazide] Other (See Comments)    REACTION: unknown  . Hydralazine Other (See Comments)    REACTION: unknown  . Lipitor [Atorvastatin] Other (See Comments)    REACTION: unknown  . Reserpine Other (See Comments)    REACTION: unknown  . Tramadol Other (See Comments)    REACTION: unknown    Patient Measurements: Height: 5\' 5"  (165.1 cm) Weight: 135 lb (61.236 kg) IBW/kg (Calculated) : 57   Vital Signs: Temp: 97.4 F (36.3 C) (09/28 1400) Temp src: Axillary (09/28 1400) BP: 124/63 mmHg (09/28 1915) Pulse Rate: 89 (09/28 1915) Intake/Output from previous day:   Intake/Output from this shift:    Labs:  Recent Labs  05/13/14 1339  WBC 9.9  HGB 11.6*  PLT 436*  CREATININE 0.59   Estimated Creatinine Clearance: 53.8 ml/min (by C-G formula based on Cr of 0.59). No results found for this basename: VANCOTROUGH, VANCOPEAK, VANCORANDOM, GENTTROUGH, GENTPEAK, GENTRANDOM, TOBRATROUGH, TOBRAPEAK, TOBRARND, AMIKACINPEAK, AMIKACINTROU, AMIKACIN,  in the last 72 hours   Microbiology: No results found for this or any previous visit (from the past 720 hour(s)).  Medical History: Past Medical History  Diagnosis Date  . Seizures   . Hypotension   . Pneumonia   . Stroke   . GI bleed   . Pulmonary embolism   . Gastrostomy tube in place     Medications:  See med rec   Assessment: Patient is a 78 y.o F presented to the ED from [facility] secondary to AMS.  To start vancomycin and cefepime for PNA. Scr 0.59 (crcl~46-54)  Goal of Therapy:  Vancomycin trough level 15-20 mcg/ml  Plan:  1) change to cefepime 1gm IV q24h 2) vancomycin 500mg  Iv q12h  Jackey Housey P 05/13/2014,7:25 PM

## 2014-05-13 NOTE — Procedures (Signed)
EEG report.   Clinical summary: 78 YO female with AMS in setting of supra therapeutic Dilantin (corrected level of 53) and sodium of 124. In addition CXR shows possible left consolidation. Mental status likely secondary to these factors but this patient was recently hospitalized for status epilepticus and some of her medications were recently withdrawn. Rule out the possibility of a coexistent subclinical status epilepticus  Technique: this is a 17 channel routine scalp EEG performed at the bedside with bipolar and monopolar montages arranged in accordance to the international 10/20 system of electrode placement. One channel was dedicated to EKG recording.  The study was performed during wakefulness, drowsiness, and stage 2 sleep. No activating procedures performed.  Description:In the wakeful state, the best background consisted of a medium amplitude, posterior dominant, poorly sustained, symmetric and reactive 7 to 8 Hz rhythm. Drowsiness demonstrated dropout of the alpha rhythm. Stage 2 sleep showed symmetric and synchronous sleep spindles without intermixed epileptiform discharges. No focal or generalized epileptiform discharges noted.  No pathologic areas of slowing seen.  EKG showed sinus rhythm.  Impression: this is a mildly abnormal awake and asleep EEG with findings consistent with a mild encephalopathy, non specific. No electrographic seizures noted.  Clinical correlation is advised.   Dorian Pod, MD

## 2014-05-13 NOTE — ED Notes (Signed)
Patient family at bedside speaking with Doctor.

## 2014-05-13 NOTE — ED Notes (Addendum)
Patient transported from [facility] hospital to [place] home on Friday. Staff reported in this same condition since then history of 90 minute seizure prior to [facility].  Doctor called [facility] for CT head and EEG outpatient. [facility] arrived called [facility] to transport to ED for evaluation. Patient responds to painful stimuli en route and arrival to ED. [facility] gave Narcan 2mg  and [facility] gave additional narcan 2mg  no change.

## 2014-05-13 NOTE — ED Notes (Signed)
Patient intermittent moves extremities left arm more then the right. Right arm minimally moves along with bilateral lower extremities.

## 2014-05-13 NOTE — ED Notes (Signed)
EKG completed and given to EDP.  

## 2014-05-14 DIAGNOSIS — T6591XS Toxic effect of unspecified substance, accidental (unintentional), sequela: Secondary | ICD-10-CM

## 2014-05-14 DIAGNOSIS — Z5189 Encounter for other specified aftercare: Secondary | ICD-10-CM

## 2014-05-14 DIAGNOSIS — T50901S Poisoning by unspecified drugs, medicaments and biological substances, accidental (unintentional), sequela: Secondary | ICD-10-CM

## 2014-05-14 DIAGNOSIS — R569 Unspecified convulsions: Secondary | ICD-10-CM

## 2014-05-14 LAB — COMPREHENSIVE METABOLIC PANEL
ALBUMIN: 3 g/dL — AB (ref 3.5–5.2)
ALT: 10 U/L (ref 0–35)
ANION GAP: 11 (ref 5–15)
AST: 20 U/L (ref 0–37)
Alkaline Phosphatase: 365 U/L — ABNORMAL HIGH (ref 39–117)
BILIRUBIN TOTAL: 0.3 mg/dL (ref 0.3–1.2)
BUN: 14 mg/dL (ref 6–23)
CHLORIDE: 92 meq/L — AB (ref 96–112)
CO2: 27 mEq/L (ref 19–32)
Calcium: 8.6 mg/dL (ref 8.4–10.5)
Creatinine, Ser: 0.39 mg/dL — ABNORMAL LOW (ref 0.50–1.10)
GFR calc Af Amer: >90 mL/min (ref >90)
GFR calc non Af Amer: >90 mL/min (ref >90)
Glucose, Bld: 114 mg/dL — ABNORMAL HIGH (ref 70–99)
Potassium: 5 mEq/L (ref 3.7–5.3)
Sodium: 130 mEq/L — ABNORMAL LOW (ref 137–147)
TOTAL PROTEIN: 6.9 g/dL (ref 6.0–8.3)

## 2014-05-14 LAB — CBC
HCT: 33.5 % — ABNORMAL LOW (ref 36.0–46.0)
Hemoglobin: 11.6 g/dL — ABNORMAL LOW (ref 12.0–15.0)
MCH: 29 pg (ref 26.0–34.0)
MCHC: 34.6 g/dL (ref 30.0–36.0)
MCV: 83.8 fL (ref 78.0–100.0)
PLATELETS: 372 10*3/uL (ref 150–400)
RBC: 4 MIL/uL (ref 3.87–5.11)
RDW: 19.3 % — AB (ref 11.5–15.5)
WBC: 9.3 10*3/uL (ref 4.0–10.5)

## 2014-05-14 LAB — PHENYTOIN LEVEL, TOTAL: PHENYTOIN LVL: 34.1 ug/mL — AB (ref 10.0–20.0)

## 2014-05-14 LAB — INFLUENZA PANEL BY PCR (TYPE A & B)
H1N1 flu by pcr: NOT DETECTED
Influenza A By PCR: NEGATIVE
Influenza B By PCR: NEGATIVE

## 2014-05-14 LAB — OSMOLALITY, URINE: Osmolality, Ur: 472 mOsm/kg (ref 390–1090)

## 2014-05-14 MED ORDER — FREE WATER
150.0000 mL | Freq: Four times a day (QID) | Status: DC
  Administered 2014-05-14 – 2014-05-20 (×22): 150 mL

## 2014-05-14 MED ORDER — JEVITY 1.2 CAL PO LIQD
1000.0000 mL | ORAL | Status: DC
  Administered 2014-05-14 – 2014-05-15 (×2)
  Administered 2014-05-18 – 2014-05-20 (×3): 1000 mL
  Filled 2014-05-14 (×9): qty 1000

## 2014-05-14 MED ORDER — PRO-STAT SUGAR FREE PO LIQD
30.0000 mL | Freq: Every day | ORAL | Status: DC
  Administered 2014-05-14 – 2014-05-20 (×7): 30 mL
  Filled 2014-05-14 (×7): qty 30

## 2014-05-14 NOTE — Progress Notes (Signed)
Utilization review completed.  

## 2014-05-14 NOTE — Procedures (Signed)
ELECTROENCEPHALOGRAM REPORT   Patient: Tanya White       Room #: 2K46 EEG No. ID: [id number] Age: 78 y.o.        Sex: female Referring Physician: Wynelle Cleveland Report Date:  05/14/2014        Interpreting Physician: Alexis Goodell D  History: Maisyn Nouri is an 78 y.o. female with a history of seizures and status epilepticus who presents with altered mental status.  Medications:  Scheduled: . apixaban  5 mg Per Tube BID  . ceFEPime (MAXIPIME) IV  1 g Intravenous Q24H  . famotidine  20 mg Per Tube BID  . feeding supplement (PRO-STAT SUGAR FREE 64)  30 mL Per Tube Q1500  . free water  150 mL Per Tube 4 times per day  . lacosamide  150 mg Per Tube BID  . Valproic Acid  500 mg Per Tube QHS  . vancomycin  500 mg Intravenous Q12H    Conditions of Recording:  This is a 16 channel EEG carried out with the patient in the unresponsive state.  Description:  The background activity consists mixture of low voltage theta and delta activity.  This activity is poorly organized but continuous.  There is also superimposed sharp activity over the right hemisphere emanating from the centro-parietal region.  This activity is intermittent.   There is no evidence of normal wakefulness.   There is no evidence of normal sleep.   Hyperventilation and intermittent photic stimulation were not performed.   IMPRESSION: This is an abnormal EEG secondary to general background slowing.  This finding may be seen with a diffuse disturbance that is etiologically nonspecific, but may include a metabolic encephalopathy, among other possibilities.  There is sharp activity noted over the right hemisphere consistent with the patient's history of seizures.  There was no evidence of subclinical seizure activity.     Alexis Goodell, MD [facility] [phone] 05/14/2014, 3:56 PM

## 2014-05-14 NOTE — Progress Notes (Addendum)
[facility] Progress Note   Tanya White JYN:[id number] DOB: 20-Sep-1935 DOA: 05/13/2014 PCP: [name], MD  Brief narrative: Tanya White is a 78 y.o. female with known history of seizure disorder on multiple seizure medications, history of left MCA CVA, dysphagia with PEG tube was recently admitted at [facility], discharged on 9/14 when she was admitted with status epilepticus. During the hospitalization, patient's seizure medications were uptitrated. She was found to have dysphagia and failed swallow screen- PEG tube was placed on 9/4. CTA chest on 9/3 showed a 2 small right lower lobe pulmonary embolus, echocardiogram showed small thrombus in the right atrium- patient was placed on Eliquis.  Subjective: Awaken and is able to tell me her name but then continues to repeat it and will not answer any further questions.   Assessment/Plan: Principal Problem:   Acute encephalopathy - multifactorial - hyponatremia- being corrected to IVF - Dilantin toxicity- holding Dilantin - unwitnessed seizure?  - due to underlying pneumonia?   Active Problems:   Hyponatremia - will need to check on how much fluid she was receiving with the tube feeds at the nursing home - currently improving with NS infusion    Left lower lobe pneumonia? - left lower lobe consolidation with left pl effusion - cont antibiotics- on Vanc and Cefepime - Influenza panel negative  Right lung base nodule on CXR - upright PA/Lat CXR recommended if able to obtain otherwise portable-I have not yet ordered this    Seizure disorder - hold Dilantin - Depakote (level was < 10) - patient's Depakote was held at the nursing home due to a mental status change- it has been resumed here -cont  Vimpat - Neuro is following and assisting with management    Hyperkalemia - improved with IVF    Dilantin toxicity - level only mildly improved from 35 to 34 - cont to follow    H/o left MCA CVA - with  thrombus in atrium on prior admission - cont Eliquis  H/o PE last admitted - cont Eliquis    Dysphagia - resume tube feeds     Code Status: DNR Family Communication: none Disposition Plan: return to SNF- DVT prophylaxis: Eliquis  Consultants: neuro  Procedures: none  Antibiotics: Anti-infectives   Start     Dose/Rate Route Frequency Ordered Stop   05/13/14 2000  vancomycin (VANCOCIN) 500 mg in sodium chloride 0.9 % 100 mL IVPB     500 mg 100 mL/hr over 60 Minutes Intravenous Every 12 hours 05/13/14 1943     05/13/14 2000  ceFEPIme (MAXIPIME) 1 g in dextrose 5 % 50 mL IVPB     1 g 100 mL/hr over 30 Minutes Intravenous Every 24 hours 05/13/14 1943           Objective: Filed Weights   05/13/14 1341 05/13/14 2025  Weight: 61.236 kg (135 lb) 52 kg (114 lb 10.2 oz)    Intake/Output Summary (Last 24 hours) at 05/14/14 1026 Last data filed at 05/14/14 0920  Gross per 24 hour  Intake 911.67 ml  Output      0 ml  Net 911.67 ml     Vitals Filed Vitals:   05/13/14 2030 05/13/14 2240 05/14/14 0305 05/14/14 0816  BP: 123/68 159/68 135/54 146/69  Pulse: 100 96 92 94  Temp: 98.1 F (36.7 C) 98.2 F (36.8 C) 97.9 F (36.6 C) 98.1 F (36.7 C)  TempSrc: Oral Axillary Axillary Axillary  Resp: 22 22 27 22   Height:  Weight:      SpO2: 95% 99% 100% 100%    Exam: General: No acute respiratory distress- minimally responsive- confused Lungs: Clear to auscultation bilaterally without wheezes or crackles Cardiovascular: Regular rate and rhythm without murmur gallop or rub normal S1 and S2 Abdomen: Nontender, nondistended, soft, bowel sounds positive, no rebound, no ascites, no appreciable mass Extremities: No significant cyanosis, clubbing, or edema bilateral lower extremities  Data Reviewed: Basic Metabolic Panel:  Recent Labs Lab 05/13/14 1339 05/14/14 0840  NA 124* 130*  K 5.5* 5.0  CL 85* 92*  CO2 30 27  GLUCOSE 115* 114*  BUN 22 14  CREATININE  0.59 0.39*  CALCIUM 8.6 8.6   Liver Function Tests:  Recent Labs Lab 05/13/14 1339 05/14/14 0840  AST 23 20  ALT 11 10  ALKPHOS 377* 365*  BILITOT 0.3 0.3  PROT 6.8 6.9  ALBUMIN 2.8* 3.0*   No results found for this basename: LIPASE, AMYLASE,  in the last 168 hours No results found for this basename: AMMONIA,  in the last 168 hours CBC:  Recent Labs Lab 05/13/14 1339 05/14/14 0840  WBC 9.9 9.3  NEUTROABS 5.7  --   HGB 11.6* 11.6*  HCT 34.1* 33.5*  MCV 83.8 83.8  PLT 436* 372   Cardiac Enzymes: No results found for this basename: CKTOTAL, CKMB, CKMBINDEX, TROPONINI,  in the last 168 hours BNP (last 3 results) No results found for this basename: PROBNP,  in the last 8760 hours CBG:  Recent Labs Lab 05/13/14 1402  [place] 108*    Recent Results (from the past 240 hour(s))  MRSA PCR SCREENING     Status: None   Collection Time    05/13/14 10:05 PM      Result Value Ref Range Status   MRSA by PCR NEGATIVE  NEGATIVE Final   Comment:            The GeneXpert MRSA Assay (FDA     approved for NASAL specimens     only), is one component of a     comprehensive MRSA colonization     surveillance program. It is not     intended to diagnose MRSA     infection nor to guide or     monitor treatment for     MRSA infections.     Studies:  Recent x-ray studies have been reviewed in detail by the Attending Physician  Scheduled Meds:  Scheduled Meds: . apixaban  5 mg Per Tube BID  . ceFEPime (MAXIPIME) IV  1 g Intravenous Q24H  . famotidine  20 mg Per Tube BID  . lacosamide  150 mg Per Tube BID  . Valproic Acid  500 mg Per Tube QHS  . vancomycin  500 mg Intravenous Q12H   Continuous Infusions: . sodium chloride 100 mL/hr at 05/14/14 0858    Time spent on care of this patient: >35 min   Aber[place]rombie, MD 05/14/2014, 10:26 AM  LOS: 1 day   [facility] Office  [phone] Pager - Text Page per [email or web address]  If 7PM-7AM, please contact  night-coverage [email or web address]

## 2014-05-14 NOTE — Evaluation (Signed)
Reviewed and agree with assessment and POC.  Burnett Lieber, PT DPT  [phone]  

## 2014-05-14 NOTE — Progress Notes (Signed)
CRITICAL VALUE ALERT  Critical value received:  Dilantin 34.1  Date of notification:  05/14/2014     Time of notification:  1000  Critical value read back:Yes.    Nurse who received alert:  Yetta Barre, RN  MD notified (1st page):  Dr Doy Mince     Time of first page:  1008   MD notified (2nd page): Rizwan  Time of second page: 1032    Responding MD:  Wynelle Cleveland  Time MD responded:  [phone]

## 2014-05-14 NOTE — Evaluation (Signed)
Physical Therapy Evaluation Patient Details Name: Tanya White MRN: [id number] DOB: 1936/02/13 Today's Date: 05/14/2014   History of Present Illness  Patient is a 78 year old female with known history of seizure disorder on multiple seizure medications, history of left MCA CVA, dysphagia with PEG tube was recently admitted at [facility], discharged on 9/14 when she was admitted with status epilepticus. patient was scheduled to have CT of the head and EEG 05/13/14 due to cognitive dysfunction, the patient was brought from the nursing facility by the EMS once found minimally responsive.   Clinical Impression  PTA, pt residing at [facility] and required assistance for ADLs and functional mobility. Currently, pt requires total to max A for bed mobility and mod A for static sitting EOB. Pt presents very lethargic, with limited responsiveness to external stimuli, and unintelligible speech. Pt will benefit from skilled PT services in order to progress toward PTA functional status. D/c plan discussed with pt's niece, plan is to return to SNF.     Follow Up Recommendations SNF    Equipment Recommendations  None recommended by PT    Recommendations for Other Services Speech consult     Precautions / Restrictions Precautions Precautions: Fall      Mobility  Bed Mobility Overal bed mobility: Needs Assistance Bed Mobility: Rolling;Sidelying to Sit;Sit to Supine   Sidelying to sit: Total assist   Sit to supine: Max assist   General bed mobility comments: Rolling: pt requires VC and tactile cueing for UE placement and use of rails. Pt able to assist with reaching over. Use of pad to roll. Sidelying to sit: use of pad to come to upright. Sit to supine: Pt able to assist with bringing LE back onto bed, more movement with L LE than R LE. Pt still requires assistance to get B LE fully on bed and to control trunk.   Transfers                    Ambulation/Gait                Stairs            Wheelchair Mobility    Modified Rankin (Stroke Patients Only)       Balance Overall balance assessment: Needs assistance Sitting-balance support: Feet supported;Bilateral upper extremity supported Sitting balance-Leahy Scale: Poor Sitting balance - Comments: Pt able to sit for 7-8 min with mod A and B LE and B UE support. pt able to maintain sitting upright 20seconds without external assitance before LOB to L. Pt demonstrates postural sway while sitting without external support.                                     Pertinent Vitals/Pain Pain Assessment: Faces Faces Pain Scale: Hurts even more Pain Location: Back Pain Descriptors / Indicators: Grimacing;Moaning Pain Intervention(s): Monitored during session;Other (comment) (Notified NSG of pain)    Home Living Family/patient expects to be discharged to:: Skilled nursing facility                      Prior Function Level of Independence: Needs assistance         Comments: Pt living at SNF PTA. Pt was up to chair with assistance and beginning to stand with assitance while at SNF     Hand Dominance        Extremity/Trunk Assessment  Upper Extremity Assessment: RUE deficits/detail;LUE deficits/detail RUE Deficits / Details: AROM WFL.  generalized weakness      LUE Deficits / Details: AROM WFL. generalized weakness and coordination deficits.     Lower Extremity Assessment: RLE deficits/detail;LLE deficits/detail (R LE weakness> L LE weakness) RLE Deficits / Details: reduced strength, limited AROM due to weakness LLE Deficits / Details: reduced strength, limited AROM 2/2 weakness  Cervical / Trunk Assessment: Kyphotic  Communication   Communication: Other (comment) (Unintelligible speech)  Cognition Arousal/Alertness: Lethargic;Suspect due to medications Behavior During Therapy: Flat affect Overall Cognitive Status: Difficult to assess Area of Impairment: Following  commands;Awareness;Problem solving;Attention   Current Attention Level: Focused   Following Commands: Follows one step commands inconsistently (Only following commands she wants to follow)     Problem Solving: Slow processing;Requires verbal cues      General Comments      Exercises        Assessment/Plan    PT Assessment Patient needs continued PT services  PT Diagnosis Difficulty walking;Generalized weakness   PT Problem List Decreased range of motion;Decreased strength;Decreased activity tolerance;Decreased balance;Decreased mobility;Decreased coordination;Decreased cognition;Decreased knowledge of use of DME;Decreased safety awareness  PT Treatment Interventions DME instruction;Functional mobility training;Therapeutic activities;Therapeutic exercise;Balance training;Neuromuscular re-education;Patient/family education   PT Goals (Current goals can be found in the Care Plan section) Acute Rehab PT Goals Patient Stated Goal: none stated PT Goal Formulation: With patient/family Time For Goal Achievement: 05/28/14 Potential to Achieve Goals: Fair    Frequency Min 3X/week   Barriers to discharge        Co-evaluation               End of Session   Activity Tolerance: Patient limited by lethargy Patient left: in bed;with call bell/phone within reach;with family/visitor present Nurse Communication: Mobility status (Back pain)         Time: 2620-3559 PT Time Calculation (min): 26 min   Charges:         PT G Codes:          Mekhia Brogan 05/14/2014, 3:02 PM Levonne Hubert, SPT

## 2014-05-14 NOTE — Progress Notes (Addendum)
INITIAL NUTRITION ASSESSMENT  DOCUMENTATION CODES Per approved criteria  -Not Applicable   INTERVENTION:  Initiate Jevity 1.2 @ 30 ml/hr and increase by 10 ml every 4 hours to goal rate of 45 ml/hr with Prostat 30 ml once daily to provide 1396 kcals, 75 gm protein, 875 ml free water daily.  Free water flushes 150 ml QID for total free water intake of 1475 ml daily.  NUTRITION DIAGNOSIS: Inadequate oral intake related to inability to eat as evidenced by NPO status.   Goal: Intake to meet >90% of estimated nutrition needs.  Monitor:  TF tolerance/adequacy, weight trend, labs.  Reason for Assessment: MD Consult for TF initiation and management.  78 y.o. female  Admitting Dx: Acute encephalopathy  ASSESSMENT: 78 year old female with known history of seizure disorder on multiple seizure medications, history of left MCA CVA, dysphagia with PEG tube was recently admitted at [facility], discharged on 9/14 when she was admitted with status epilepticus. PEG tube was placed on 9/4. Patient was brought from the nursing facility by the EMS once found minimally responsive.  From review of chart and discussion with patient's daughter, patient was receiving Jevity 1.5 1 can 3 times daily. The amount was recently increased to 1 whole can from 1/2 can due to decreasing weight and inadequate oral intake. Patient is currently not alert enough to take PO's. Per RN, alertness fluctuates.   Received MD Consult for TF initiation and management. IV is saline locked, will need free water flushes too.  Nutrition Focused Physical Exam:  Subcutaneous Fat:  Orbital Region: WNL Upper Arm Region: WNL Thoracic and Lumbar Region: NA  Muscle:  Temple Region: WNL Clavicle Bone Region: mild depletion Clavicle and Acromion Bone Region: mild depletion Scapular Bone Region: NA Dorsal Hand: WNL Patellar Region: WNL Anterior Thigh Region: WNL Posterior Calf Region: WNL  Edema:  none   Height: Ht Readings from Last 1 Encounters:  05/13/14 5\' 1"  (1.549 m)    Weight: Wt Readings from Last 1 Encounters:  05/13/14 114 lb 10.2 oz (52 kg)    Ideal Body Weight: 47.7 kg  % Ideal Body Weight: 109%  Wt Readings from Last 10 Encounters:  05/13/14 114 lb 10.2 oz (52 kg)    Usual Body Weight: unknown  % Usual Body Weight: NA  BMI:  Body mass index is 21.67 kg/(m^2).  Estimated Nutritional Needs: Kcal: 1200-1400 Protein: 70-85 gm Fluid: 1.2-1.4 L  Skin: open wound to buttocks  Diet Order: NPO  EDUCATION NEEDS: -Education not appropriate at this time   Intake/Output Summary (Last 24 hours) at 05/14/14 1122 Last data filed at 05/14/14 0920  Gross per 24 hour  Intake 911.67 ml  Output      0 ml  Net 911.67 ml    Last BM: None documented since admission    Labs:   Recent Labs Lab 05/13/14 1339 05/14/14 0840  NA 124* 130*  K 5.5* 5.0  CL 85* 92*  CO2 30 27  BUN 22 14  CREATININE 0.59 0.39*  CALCIUM 8.6 8.6  GLUCOSE 115* 114*    CBG (last 3)   Recent Labs  05/13/14 1402  GLUCAP 108*    Scheduled Meds: . apixaban  5 mg Per Tube BID  . ceFEPime (MAXIPIME) IV  1 g Intravenous Q24H  . famotidine  20 mg Per Tube BID  . lacosamide  150 mg Per Tube BID  . Valproic Acid  500 mg Per Tube QHS  . vancomycin  500 mg Intravenous  Q12H    Continuous Infusions: . sodium chloride 100 mL/hr at 05/14/14 0165    Past Medical History  Diagnosis Date  . Seizures   . Hypotension   . Pneumonia   . Stroke   . GI bleed   . Pulmonary embolism   . Gastrostomy tube in place     History reviewed. No pertinent past surgical history.   Molli Barrows, RD, LDN, [place] Pager [phone] After Hours Pager [phone]

## 2014-05-14 NOTE — Progress Notes (Addendum)
Subjective: Patient awake this morning.  Speech unintelligible for the most.    Objective: Current vital signs: BP 146/69  Pulse 94  Temp(Src) 98.1 F (36.7 C) (Axillary)  Resp 22  Ht 5\' 1"  (1.549 m)  Wt 52 kg (114 lb 10.2 oz)  BMI 21.67 kg/m2  SpO2 100% Vital signs in last 24 hours: Temp:  [97.3 F (36.3 C)-98.2 F (36.8 C)] 98.1 F (36.7 C) (09/29 0816) Pulse Rate:  [71-100] 94 (09/29 0816) Resp:  [18-28] 22 (09/29 0816) BP: (96-161)/(41-97) 146/69 mmHg (09/29 0816) SpO2:  [95 %-100 %] 100 % (09/29 0816) Weight:  [52 kg (114 lb 10.2 oz)-61.236 kg (135 lb)] 52 kg (114 lb 10.2 oz) (09/28 2025)  Intake/Output from previous day: 09/28 0701 - 09/29 0700 In: 911.7 [I.V.:911.7] Out: -  Intake/Output this shift:   Nutritional status: NPO  Neurologic Exam: Mental Status: Awake.  Does not follow commands.  Speech slurred and unintelligible.   Cranial Nerves: EOM's grossly intact.  Right facial droop.  Formal testing unable to be performed secondary to patient's level of cooperation.   Motor: Right hemiparesis Deep Tendon Reflexes: 2+ and symmetric throughout Plantars: Right: mute   Left: mute   Lab Results: Basic Metabolic Panel:  Recent Labs Lab 05/13/14 1339 05/14/14 0840  NA 124* 130*  K 5.5* 5.0  CL 85* 92*  CO2 30 27  GLUCOSE 115* 114*  BUN 22 14  CREATININE 0.59 0.39*  CALCIUM 8.6 8.6    Liver Function Tests:  Recent Labs Lab 05/13/14 1339 05/14/14 0840  AST 23 20  ALT 11 10  ALKPHOS 377* 365*  BILITOT 0.3 0.3  PROT 6.8 6.9  ALBUMIN 2.8* 3.0*   No results found for this basename: LIPASE, AMYLASE,  in the last 168 hours No results found for this basename: AMMONIA,  in the last 168 hours  CBC:  Recent Labs Lab 05/13/14 1339 05/14/14 0840  WBC 9.9 9.3  NEUTROABS 5.7  --   HGB 11.6* 11.6*  HCT 34.1* 33.5*  MCV 83.8 83.8  PLT 436* 372    Cardiac Enzymes: No results found for this basename: CKTOTAL, CKMB, CKMBINDEX, TROPONINI,  in  the last 168 hours  Lipid Panel: No results found for this basename: CHOL, TRIG, HDL, CHOLHDL, VLDL, LDLCALC,  in the last 168 hours  CBG:  Recent Labs Lab 05/13/14 [place] 108*    Microbiology: Results for orders placed during the hospital encounter of 05/13/14  MRSA PCR SCREENING     Status: None   Collection Time    05/13/14 10:05 PM      Result Value Ref Range Status   MRSA by PCR NEGATIVE  NEGATIVE Final   Comment:            The GeneXpert MRSA Assay (FDA     approved for NASAL specimens     only), is one component of a     comprehensive MRSA colonization     surveillance program. It is not     intended to diagnose MRSA     infection nor to guide or     monitor treatment for     MRSA infections.    Coagulation Studies: No results found for this basename: LABPROT, INR,  in the last 72 hours  Imaging: Ct Head Wo Contrast  05/13/2014   CLINICAL DATA:  Altered mental status.  EXAM: CT HEAD WITHOUT CONTRAST  TECHNIQUE: Contiguous axial images were obtained from the base of the skull through  the vertex without intravenous contrast.  COMPARISON:  03/09/2014  FINDINGS: Area of encephalomalacia in the posterior left parietal lobe compatible with old infarct, stable. Previously seen subdural hematomas have resolved. Mild chronic microvascular changes throughout the deep white matter. No acute hemorrhage or infarct. No hydrocephalus.  No acute calvarial abnormality. Visualized paranasal sinuses and mastoids clear. Orbital soft tissues unremarkable.  IMPRESSION: Fold of posterior left MCA infarct with encephalomalacia.  Mild chronic microvascular changes.  No acute intracranial abnormality.   Electronically Signed   By: Rolm Baptise M.D.   On: 05/13/2014 14:17   Dg Chest Port 1 View  05/13/2014   CLINICAL DATA:  Altered mental status  EXAM: PORTABLE CHEST - 1 VIEW  COMPARISON:  Portable exam 1408 hr compared to 02/17/2014  FINDINGS: Enlargement of cardiac silhouette.   Atherosclerotic calcification aorta.  Pulmonary vascularity normal.  Slight prominent RIGHT peritracheal soft tissues though this may be slightly accentuated versus previous study due to rotation to the LEFT.  Atelectasis versus consolidation of LEFT lower lobe.  Accompanying LEFT pleural effusion as well.  Ovoid nodular density versus superimposed clothing artifact at RIGHT base 19 x 14 mm.  RIGHT lung otherwise clear.  No pneumothorax.  Bones demineralized.  IMPRESSION: Enlargement of cardiac silhouette.  Atelectasis versus consolidation in LEFT lower lobe with associated LEFT pleural effusion.  Questionable nodular density RIGHT lung base versus superimposed artifact; followup upright PA and lateral chest radiographs recommended to further assess ; if patient is unable to perform upright imaging, an upright AP radiograph would then be recommended.   Electronically Signed   By: Lavonia Dana M.D.   On: 05/13/2014 14:25    Medications:  I have reviewed the patient's current medications. Scheduled: . apixaban  5 mg Per Tube BID  . ceFEPime (MAXIPIME) IV  1 g Intravenous Q24H  . famotidine  20 mg Per Tube BID  . lacosamide  150 mg Per Tube BID  . Valproic Acid  500 mg Per Tube QHS  . vancomycin  500 mg Intravenous Q12H    Assessment/Plan: Patient improved today.  Unclear about her baseline but clearly better than yesterday.  Sodium 130.  Dilantin level remains elevated at 34.1.  EEG reviewed and shows intermittent sharp activity consistent with her history of seizures but no evidence of subclinical seizure activity.     Recommendations: 1.  Continue to hold Dilantin 2.  Agree with continued treatment of sodium.     LOS: 1 day   Alexis Goodell, MD [facility] [phone] 05/14/2014  11:34 AM

## 2014-05-14 NOTE — ED Provider Notes (Signed)
I saw and evaluated the patient, reviewed the resident's note and I agree with the findings and plan.   EKG Interpretation   Date/Time:  Monday May 13 2014 13:34:18 EDT Ventricular Rate:  79 PR Interval:  154 QRS Duration: 107 QT Interval:  398 QTC Calculation: 456 R Axis:   -177 Text Interpretation:  Sinus rhythm Rightward axis Probable right  ventricular hypertrophy Nonspecific T wave abnormality No previous tracing  Confirmed by Ashok Cordia  MD, Lennette Bihari ([id number]) on 05/13/2014 1:55:58 PM      Pt presents via ems with altered mental status.  Pt unresponsive verbally - level 5 caveat.  Pt noted w progressive generalized weakness, poor po intake.   Pt lethargic, minimally responsive on exam.  o2 [place]. Continuous pulse ox and monitor. Labs.   Iv ns bolus.  Labs return w markedly abn low NA 124.   On review prior records previous recorded NA was 140.  Additional ns bolus.   Results for orders placed during the hospital encounter of 05/13/14  MRSA PCR SCREENING      Result Value Ref Range   MRSA by PCR NEGATIVE  NEGATIVE  CBC WITH DIFFERENTIAL      Result Value Ref Range   WBC 9.9  4.0 - 10.5 K/uL   RBC 4.07  3.87 - 5.11 MIL/uL   Hemoglobin 11.6 (*) 12.0 - 15.0 g/dL   HCT 34.1 (*) 36.0 - 46.0 %   MCV 83.8  78.0 - 100.0 fL   MCH 28.5  26.0 - 34.0 pg   MCHC 34.0  30.0 - 36.0 g/dL   RDW 19.6 (*) 11.5 - 15.5 %   Platelets 436 (*) 150 - 400 K/uL   Neutrophils Relative % 59  43 - 77 %   Neutro Abs 5.7  1.7 - 7.7 K/uL   Lymphocytes Relative 27  12 - 46 %   Lymphs Abs 2.7  0.7 - 4.0 K/uL   Monocytes Relative 13 (*) 3 - 12 %   Monocytes Absolute 1.3 (*) 0.1 - 1.0 K/uL   Eosinophils Relative 1  0 - 5 %   Eosinophils Absolute 0.1  0.0 - 0.7 K/uL   Basophils Relative 0  0 - 1 %   Basophils Absolute 0.0  0.0 - 0.1 K/uL  COMPREHENSIVE METABOLIC PANEL      Result Value Ref Range   Sodium 124 (*) 137 - 147 mEq/L   Potassium 5.5 (*) 3.7 - 5.3 mEq/L   Chloride 85 (*) 96 - 112  mEq/L   CO2 30  19 - 32 mEq/L   Glucose, Bld 115 (*) 70 - 99 mg/dL   BUN 22  6 - 23 mg/dL   Creatinine, Ser 0.59  0.50 - 1.10 mg/dL   Calcium 8.6  8.4 - 10.5 mg/dL   Total Protein 6.8  6.0 - 8.3 g/dL   Albumin 2.8 (*) 3.5 - 5.2 g/dL   AST 23  0 - 37 U/L   ALT 11  0 - 35 U/L   Alkaline Phosphatase 377 (*) 39 - 117 U/L   Total Bilirubin 0.3  0.3 - 1.2 mg/dL   GFR calc non Af Amer 87 (*) >90 mL/min   GFR calc Af Amer >90  >90 mL/min   Anion gap 9  5 - 15  URINALYSIS, ROUTINE W REFLEX MICROSCOPIC      Result Value Ref Range   Color, Urine YELLOW  YELLOW   APPearance CLEAR  CLEAR   Specific Gravity, Urine 1.017  1.005 - 1.030   pH 7.0  5.0 - 8.0   Glucose, UA NEGATIVE  NEGATIVE mg/dL   Hgb urine dipstick NEGATIVE  NEGATIVE   Bilirubin Urine NEGATIVE  NEGATIVE   Ketones, ur NEGATIVE  NEGATIVE mg/dL   Protein, ur NEGATIVE  NEGATIVE mg/dL   Urobilinogen, UA 0.2  0.0 - 1.0 mg/dL   Nitrite NEGATIVE  NEGATIVE   Leukocytes, UA NEGATIVE  NEGATIVE  URINE RAPID DRUG SCREEN (HOSP PERFORMED)      Result Value Ref Range   Opiates NONE DETECTED  NONE DETECTED   Cocaine NONE DETECTED  NONE DETECTED   Benzodiazepines NONE DETECTED  NONE DETECTED   Amphetamines NONE DETECTED  NONE DETECTED   Tetrahydrocannabinol NONE DETECTED  NONE DETECTED   Barbiturates NONE DETECTED  NONE DETECTED  VALPROIC ACID LEVEL      Result Value Ref Range   Valproic Acid Lvl <10.0 (*) 50.0 - 100.0 ug/mL  PHENYTOIN LEVEL, TOTAL      Result Value Ref Range   Phenytoin Lvl 35.5 (*) 10.0 - 20.0 ug/mL  SODIUM, URINE, RANDOM      Result Value Ref Range   Sodium, Ur 72    OSMOLALITY, URINE      Result Value Ref Range   Osmolality, Ur 472  390 - 1090 mOsm/kg  OSMOLALITY      Result Value Ref Range   Osmolality 260 (*) 275 - 300 mOsm/kg  LACTIC ACID, PLASMA      Result Value Ref Range   Lactic Acid, Venous 1.2  0.5 - 2.2 mmol/L  PROCALCITONIN      Result Value Ref Range   Procalcitonin <0.10    CBC      Result  Value Ref Range   WBC 9.3  4.0 - 10.5 K/uL   RBC 4.00  3.87 - 5.11 MIL/uL   Hemoglobin 11.6 (*) 12.0 - 15.0 g/dL   HCT 33.5 (*) 36.0 - 46.0 %   MCV 83.8  78.0 - 100.0 fL   MCH 29.0  26.0 - 34.0 pg   MCHC 34.6  30.0 - 36.0 g/dL   RDW 19.3 (*) 11.5 - 15.5 %   Platelets 372  150 - 400 K/uL  COMPREHENSIVE METABOLIC PANEL      Result Value Ref Range   Sodium 130 (*) 137 - 147 mEq/L   Potassium 5.0  3.7 - 5.3 mEq/L   Chloride 92 (*) 96 - 112 mEq/L   CO2 27  19 - 32 mEq/L   Glucose, Bld 114 (*) 70 - 99 mg/dL   BUN 14  6 - 23 mg/dL   Creatinine, Ser 0.39 (*) 0.50 - 1.10 mg/dL   Calcium 8.6  8.4 - 10.5 mg/dL   Total Protein 6.9  6.0 - 8.3 g/dL   Albumin 3.0 (*) 3.5 - 5.2 g/dL   AST 20  0 - 37 U/L   ALT 10  0 - 35 U/L   Alkaline Phosphatase 365 (*) 39 - 117 U/L   Total Bilirubin 0.3  0.3 - 1.2 mg/dL   GFR calc non Af Amer >90  >90 mL/min   GFR calc Af Amer >90  >90 mL/min   Anion gap 11  5 - 15  PHENYTOIN LEVEL, TOTAL      Result Value Ref Range   Phenytoin Lvl 34.1 (*) 10.0 - 20.0 ug/mL  INFLUENZA PANEL BY PCR (TYPE A & B, H1N1)      Result Value Ref Range   Influenza A  By PCR NEGATIVE  NEGATIVE   Influenza B By PCR NEGATIVE  NEGATIVE   H1N1 flu by pcr NOT DETECTED  NOT DETECTED  CBG MONITORING, ED      Result Value Ref Range   Glucose-Capillary 108 (*) 70 - 99 mg/dL   Comment 1 Notify RN    I-STAT TROPOININ, ED      Result Value Ref Range   Troponin i, poc 0.00  0.00 - 0.08 ng/mL   Comment 3            Ct Head Wo Contrast  05/13/2014   CLINICAL DATA:  Altered mental status.  EXAM: CT HEAD WITHOUT CONTRAST  TECHNIQUE: Contiguous axial images were obtained from the base of the skull through the vertex without intravenous contrast.  COMPARISON:  03/09/2014  FINDINGS: Area of encephalomalacia in the posterior left parietal lobe compatible with old infarct, stable. Previously seen subdural hematomas have resolved. Mild chronic microvascular changes throughout the deep white matter.  No acute hemorrhage or infarct. No hydrocephalus.  No acute calvarial abnormality. Visualized paranasal sinuses and mastoids clear. Orbital soft tissues unremarkable.  IMPRESSION: Fold of posterior left MCA infarct with encephalomalacia.  Mild chronic microvascular changes.  No acute intracranial abnormality.   Electronically Signed   By: Rolm Baptise M.D.   On: 05/13/2014 14:17   Dg Chest Port 1 View  05/13/2014   CLINICAL DATA:  Altered mental status  EXAM: PORTABLE CHEST - 1 VIEW  COMPARISON:  Portable exam 1408 hr compared to 02/17/2014  FINDINGS: Enlargement of cardiac silhouette.  Atherosclerotic calcification aorta.  Pulmonary vascularity normal.  Slight prominent RIGHT peritracheal soft tissues though this may be slightly accentuated versus previous study due to rotation to the LEFT.  Atelectasis versus consolidation of LEFT lower lobe.  Accompanying LEFT pleural effusion as well.  Ovoid nodular density versus superimposed clothing artifact at RIGHT base 19 x 14 mm.  RIGHT lung otherwise clear.  No pneumothorax.  Bones demineralized.  IMPRESSION: Enlargement of cardiac silhouette.  Atelectasis versus consolidation in LEFT lower lobe with associated LEFT pleural effusion.  Questionable nodular density RIGHT lung base versus superimposed artifact; followup upright PA and lateral chest radiographs recommended to further assess ; if patient is unable to perform upright imaging, an upright AP radiograph would then be recommended.   Electronically Signed   By: Lavonia Dana M.D.   On: 05/13/2014 14:25    CRITICAL CARE  RE:  Severe hyponatremia, markedly altered/depressed mental status. Performed by: Mirna Mires Total critical care time: 35 Critical care time was exclusive of separately billable procedures and treating other patients. Critical care was necessary to treat or prevent imminent or life-threatening deterioration. Critical care was time spent personally by me on the following activities:  development of treatment plan with patient and/or surrogate as well as nursing, discussions with consultants, evaluation of patient's response to treatment, examination of patient, obtaining history from patient or surrogate, ordering and performing treatments and interventions, ordering and review of laboratory studies, ordering and review of radiographic studies, pulse oximetry and re-evaluation of patient's condition.   Mirna Mires, MD 05/14/14 [phone]

## 2014-05-14 NOTE — Clinical Social Work Psychosocial (Signed)
Clinical Social Work Department BRIEF PSYCHOSOCIAL ASSESSMENT 05/14/2014  Patient:  Tanya White,Tanya White     Account Number:  [id number]     Admit date:  05/13/2014  Clinical Social Worker:  Marciano Sequin  Date/Time:  05/14/2014 01:00 PM  Referred by:  RN  Date Referred:  05/14/2014 Referred for  SNF Placement   Other Referral:   Interview type:  Family Other interview type:   Pt is disorient4x. Cathlean Sauer is the nephew (POA) [phone] & Katrina is the niece [phone].    PSYCHOSOCIAL DATA Living Status:  ALONE Admitted from facility:  [facility], [place] Level of care:  [place] Primary support name:  Chrissie Noa Marsh:nephew [facility]) & Katrina:niece Primary support relationship to patient:  FAMILY Degree of support available:   Strong    CURRENT CONCERNS  Other Concerns:    SOCIAL WORK ASSESSMENT / PLAN CSW met with the pt's POA her nephew Chrissie Noa. CSW introduce self and purpose of visit. Chrissie Noa reported that he is the POA, but he commuicate with Katrina the pt's niece to decided on care for the pt. Chrissie Noa reported the pt was receiving rehab from [facility]. Chrissie Noa reported the family maybe looking into long-term care depending on the pt's progress. Haynes Bast reported that the pt can return back to [facility]. CSW explained the SNF process to the [place]. CSW provided the pt with contact information for further questions. CSW will continue to follow this pt and assist with discharge as needed.   Assessment/plan status:  Information/Referral to [employer] Other assessment/ plan:   Return to [facility]   Information/referral to community resources:    PATIENT'S/FAMILY'S RESPONSE TO PLAN OF CARE: Agreed   [place], MSW, [place]

## 2014-05-15 ENCOUNTER — Encounter (HOSPITAL_COMMUNITY): Payer: Self-pay | Admitting: *Deleted

## 2014-05-15 DIAGNOSIS — R404 Transient alteration of awareness: Secondary | ICD-10-CM

## 2014-05-15 DIAGNOSIS — I359 Nonrheumatic aortic valve disorder, unspecified: Secondary | ICD-10-CM

## 2014-05-15 LAB — CBC
HCT: 27.9 % — ABNORMAL LOW (ref 36.0–46.0)
HEMOGLOBIN: 9.5 g/dL — AB (ref 12.0–15.0)
MCH: 28.9 pg (ref 26.0–34.0)
MCHC: 34.1 g/dL (ref 30.0–36.0)
MCV: 84.8 fL (ref 78.0–100.0)
Platelets: 335 10*3/uL (ref 150–400)
RBC: 3.29 MIL/uL — ABNORMAL LOW (ref 3.87–5.11)
RDW: 19.3 % — ABNORMAL HIGH (ref 11.5–15.5)
WBC: 7.6 10*3/uL (ref 4.0–10.5)

## 2014-05-15 LAB — GLUCOSE, CAPILLARY
GLUCOSE-CAPILLARY: 112 mg/dL — AB (ref 70–99)
GLUCOSE-CAPILLARY: 153 mg/dL — AB (ref 70–99)
Glucose-Capillary: 86 mg/dL (ref 70–99)
Glucose-Capillary: 115 mg/dL — ABNORMAL HIGH (ref 70–99)
Glucose-Capillary: 97 mg/dL (ref 70–99)
Glucose-Capillary: 135 mg/dL — ABNORMAL HIGH (ref 70–99)
Glucose-Capillary: 128 mg/dL — ABNORMAL HIGH (ref 70–99)
Glucose-Capillary: 125 mg/dL — ABNORMAL HIGH (ref 70–99)

## 2014-05-15 LAB — BASIC METABOLIC PANEL
Anion gap: 9 (ref 5–15)
BUN: 14 mg/dL (ref 6–23)
CHLORIDE: 96 meq/L (ref 96–112)
CO2: 25 meq/L (ref 19–32)
CREATININE: 0.43 mg/dL — AB (ref 0.50–1.10)
Calcium: 7.9 mg/dL — ABNORMAL LOW (ref 8.4–10.5)
GFR calc Af Amer: >90 mL/min (ref >90)
GFR calc non Af Amer: >90 mL/min (ref >90)
GLUCOSE: 103 mg/dL — AB (ref 70–99)
Potassium: 4.5 mEq/L (ref 3.7–5.3)
Sodium: 130 mEq/L — ABNORMAL LOW (ref 137–147)

## 2014-05-15 LAB — VALPROIC ACID LEVEL

## 2014-05-15 LAB — HEMOGLOBIN AND HEMATOCRIT, BLOOD
HCT: 31.5 % — ABNORMAL LOW (ref 36.0–46.0)
Hemoglobin: 10.5 g/dL — ABNORMAL LOW (ref 12.0–15.0)

## 2014-05-15 LAB — PHENYTOIN LEVEL, TOTAL: PHENYTOIN LVL: 27.4 ug/mL — AB (ref 10.0–20.0)

## 2014-05-15 LAB — PROCALCITONIN: Procalcitonin: <0.1 ng/mL

## 2014-05-15 NOTE — Progress Notes (Signed)
  Echocardiogram 2D Echocardiogram has been performed.  Donata Clay 05/15/2014, 12:12 PM

## 2014-05-15 NOTE — Progress Notes (Signed)
Subjective: Patient most awake family has seen her in the past month  Exam: Filed Vitals:   05/15/14 1200  BP:   Pulse:   Temp: 97.6 F (36.4 C)  Resp:    Gen: In bed, NAD MS: Patient has minimal speech, able to tell me her name, does not clearly follow commands. CN: Pupils equal round and reactive, right facial droop. She is able to cross midline both directions with extraocular movements Motor: Has a right hemiparesis Dtr: 2+ and symmetric  Impression: 78 year old female with a history of stroke causing right hemiparesis and aphasia who presented with a total of 2 lifetime seizures, the second of which was status epilepticus. She was on 4 AEDs, tapered to three per the daughter, but was on very large doses. Apparently depakote has been very sedating to the patient.   Recommendations: 1) Of note, apixaban is contraindicated with dilantin. 2) I would favor starting levetiracetam in conjunction with vimpat once her levels of dilantin are low theraputic.  3) will continue to follow.   Roland Rack, MD [facility] [phone]  If 7pm- 7am, please page neurology on call as listed in [place].

## 2014-05-15 NOTE — Progress Notes (Signed)
[facility] Progress Note   Tanya White [id number]:[id number] DOB: 05/18/1936 DOA: 05/13/2014 PCP: [name], MD  Brief narrative: Tanya White is a 78 y.o. female with known history of seizure disorder on multiple seizure medications, history of left MCA CVA, dysphagia with PEG tube was recently admitted at [facility], discharged on 9/14 when she was admitted with status epilepticus. During the hospitalization, patient's seizure medications were uptitrated. She was found to have dysphagia and failed swallow screen- PEG tube was placed on 9/4. CTA chest on 9/3 showed a 2 small right lower lobe pulmonary embolus, echocardiogram showed small thrombus in the right atrium- patient was placed on Eliquis.  Subjective: Mumbling. But able to recognize daughter and answering simple questions.   Assessment/Plan: Principal Problem:   Acute encephalopathy - multifactorial - hyponatremia- being corrected to IVF - Dilantin toxicity- holding Dilantin - unwitnessed seizure?  - due to underlying pneumonia?   Active Problems:   Hyponatremia - will need to check on how much fluid she was receiving with the tube feeds at the nursing home - currently improving with NS infusion    Left lower lobe pneumonia? - left lower lobe consolidation with left pl effusion - cont antibiotics- on Vanc and Cefepime - Influenza panel negative  Right lung base nodule on CXR - upright PA/Lat CXR recommended if able to obtain otherwise portable-I have not yet ordered this    Seizure disorder - hold Dilantin - Depakote (level was < 10) - patient's Depakote was held at the nursing home due to a mental status change- it has been resumed here -cont  Vimpat - Neuro is following and assisting with management    Hyperkalemia - improved with IVF    Dilantin toxicity - level only mildly improved from 35 to 34 - cont to follow    H/o left MCA CVA - with thrombus in atrium on prior admission -  cont Eliquis  H/o PE last admitted - cont Eliquis    Dysphagia - resume tube feeds     Code Status: DNR Family Communication: none Disposition Plan: return to SNF- DVT prophylaxis: Eliquis  Consultants: neuro  Procedures: none  Antibiotics: Anti-infectives   Start     Dose/Rate Route Frequency Ordered Stop   05/13/14 2000  vancomycin (VANCOCIN) 500 mg in sodium chloride 0.9 % 100 mL IVPB     500 mg 100 mL/hr over 60 Minutes Intravenous Every 12 hours 05/13/14 1943     05/13/14 2000  ceFEPIme (MAXIPIME) 1 g in dextrose 5 % 50 mL IVPB     1 g 100 mL/hr over 30 Minutes Intravenous Every 24 hours 05/13/14 1943           Objective: Filed Weights   05/13/14 2025 05/14/14 2200 05/15/14 0426  Weight: 52 kg (114 lb 10.2 oz) 52.6 kg (115 lb 15.4 oz) 52.6 kg (115 lb 15.4 oz)    Intake/Output Summary (Last 24 hours) at 05/15/14 1106 Last data filed at 05/14/14 2000  Gross per 24 hour  Intake 451.67 ml  Output     50 ml  Net 401.67 ml     Vitals Filed Vitals:   05/14/14 2305 05/15/14 0408 05/15/14 0426 05/15/14 0800  BP: 131/68 122/52    Pulse: 95 96    Temp: 97.9 F (36.6 C) 97.2 F (36.2 C)  97.1 F (36.2 C)  TempSrc: Oral Axillary  Axillary  Resp: 22 17    Height:      Weight:   52.6 kg (115  lb 15.4 oz)   SpO2: 100% 100%      Exam: General: No acute respiratory distress- minimally responsive- confused Lungs: Clear to auscultation bilaterally without wheezes or crackles Cardiovascular: Regular rate and rhythm without murmur gallop or rub normal S1 and S2 Abdomen: Nontender, nondistended, soft, bowel sounds positive, no rebound, no ascites, no appreciable mass Extremities: No significant cyanosis, clubbing, or edema bilateral lower extremities  Data Reviewed: Basic Metabolic Panel:  Recent Labs Lab 05/13/14 1339 05/14/14 0840 05/15/14 0258  NA 124* 130* 130*  K 5.5* 5.0 4.5  CL 85* 92* 96  CO2 30 27 25   GLUCOSE 115* 114* 103*  BUN 22 14 14    CREATININE 0.59 0.39* 0.43*  CALCIUM 8.6 8.6 7.9*   Liver Function Tests:  Recent Labs Lab 05/13/14 1339 05/14/14 0840  AST 23 20  ALT 11 10  ALKPHOS 377* 365*  BILITOT 0.3 0.3  PROT 6.8 6.9  ALBUMIN 2.8* 3.0*   No results found for this basename: LIPASE, AMYLASE,  in the last 168 hours No results found for this basename: AMMONIA,  in the last 168 hours CBC:  Recent Labs Lab 05/13/14 1339 05/14/14 0840 05/15/14 0258  WBC 9.9 9.3 7.6  NEUTROABS 5.7  --   --   HGB 11.6* 11.6* 9.5*  HCT 34.1* 33.5* 27.9*  MCV 83.8 83.8 84.8  PLT 436* 372 335   Cardiac Enzymes: No results found for this basename: CKTOTAL, CKMB, CKMBINDEX, TROPONINI,  in the last 168 hours BNP (last 3 results) No results found for this basename: PROBNP,  in the last 8760 hours CBG:  Recent Labs Lab 05/13/14 1402 05/14/14 1928 05/14/14 2306 05/15/14 0407 05/15/14 0801  GLUCAP 108* 86 115* 97 135*    Recent Results (from the past 240 hour(s))  CULTURE, BLOOD (ROUTINE X 2)     Status: None   Collection Time    05/13/14  9:14 PM      Result Value Ref Range Status   Specimen Description BLOOD RIGHT HAND   Final   Special Requests BOTTLES DRAWN AEROBIC ONLY 4CC   Final   Culture  Setup Time     Final   Value: 05/14/2014 00:29     Performed at [facility]   Culture     Final   Value:        BLOOD CULTURE RECEIVED NO GROWTH TO DATE CULTURE WILL BE HELD FOR 5 DAYS BEFORE ISSUING A FINAL NEGATIVE REPORT     Performed at [facility]   Report Status PENDING   Incomplete  CULTURE, BLOOD (ROUTINE X 2)     Status: None   Collection Time    05/13/14  9:27 PM      Result Value Ref Range Status   Specimen Description BLOOD LEFT ARM   Final   Special Requests BOTTLES DRAWN AEROBIC ONLY 3CC   Final   Culture  Setup Time     Final   Value: 05/14/2014 00:30     Performed at [facility]   Culture     Final   Value:        BLOOD CULTURE RECEIVED NO GROWTH TO DATE CULTURE WILL BE  HELD FOR 5 DAYS BEFORE ISSUING A FINAL NEGATIVE REPORT     Performed at [facility]   Report Status PENDING   Incomplete  MRSA PCR SCREENING     Status: None   Collection Time    05/13/14 10:05 PM  Result Value Ref Range Status   MRSA by PCR NEGATIVE  NEGATIVE Final   Comment:            The GeneXpert MRSA Assay (FDA     approved for NASAL specimens     only), is one component of a     comprehensive MRSA colonization     surveillance program. It is not     intended to diagnose MRSA     infection nor to guide or     monitor treatment for     MRSA infections.     Studies:  Recent x-ray studies have been reviewed in detail by the Attending Physician  Scheduled Meds:  Scheduled Meds: . apixaban  5 mg Per Tube BID  . ceFEPime (MAXIPIME) IV  1 g Intravenous Q24H  . famotidine  20 mg Per Tube BID  . feeding supplement (PRO-STAT SUGAR FREE 64)  30 mL Per Tube Q1500  . free water  150 mL Per Tube 4 times per day  . lacosamide  150 mg Per Tube BID  . Valproic Acid  500 mg Per Tube QHS  . vancomycin  500 mg Intravenous Q12H   Continuous Infusions: . sodium chloride 100 mL/hr at 05/15/14 0700  . feeding supplement (JEVITY 1.2 CAL) 1,000 mL (05/15/14 0700)    Time spent on care of this patient: >35 min   Tobi Leinweber, MD 05/15/2014, 11:06 AM  LOS: 2 days   [facility] Office  [phone] Pager - Text Page per [email or web address]  If 7PM-7AM, please contact night-coverage [email or web address]

## 2014-05-16 ENCOUNTER — Inpatient Hospital Stay (HOSPITAL_COMMUNITY): Payer: PRIVATE HEALTH INSURANCE

## 2014-05-16 DIAGNOSIS — G9340 Encephalopathy, unspecified: Secondary | ICD-10-CM

## 2014-05-16 DIAGNOSIS — T420X4D Poisoning by hydantoin derivatives, undetermined, subsequent encounter: Secondary | ICD-10-CM

## 2014-05-16 DIAGNOSIS — R404 Transient alteration of awareness: Secondary | ICD-10-CM

## 2014-05-16 LAB — GLUCOSE, CAPILLARY
GLUCOSE-CAPILLARY: 123 mg/dL — AB (ref 70–99)
Glucose-Capillary: 109 mg/dL — ABNORMAL HIGH (ref 70–99)
Glucose-Capillary: 125 mg/dL — ABNORMAL HIGH (ref 70–99)
Glucose-Capillary: 139 mg/dL — ABNORMAL HIGH (ref 70–99)
Glucose-Capillary: 114 mg/dL — ABNORMAL HIGH (ref 70–99)

## 2014-05-16 LAB — BASIC METABOLIC PANEL
ANION GAP: 12 (ref 5–15)
BUN: 11 mg/dL (ref 6–23)
CALCIUM: 8.4 mg/dL (ref 8.4–10.5)
CO2: 26 mEq/L (ref 19–32)
CREATININE: 0.37 mg/dL — AB (ref 0.50–1.10)
Chloride: 95 mEq/L — ABNORMAL LOW (ref 96–112)
GFR calc Af Amer: >90 mL/min (ref >90)
GFR calc non Af Amer: >90 mL/min (ref >90)
Glucose, Bld: 110 mg/dL — ABNORMAL HIGH (ref 70–99)
Potassium: 4.4 mEq/L (ref 3.7–5.3)
SODIUM: 133 meq/L — AB (ref 137–147)

## 2014-05-16 LAB — CBC
HCT: 31.9 % — ABNORMAL LOW (ref 36.0–46.0)
Hemoglobin: 10.7 g/dL — ABNORMAL LOW (ref 12.0–15.0)
MCH: 28.6 pg (ref 26.0–34.0)
MCHC: 33.5 g/dL (ref 30.0–36.0)
MCV: 85.3 fL (ref 78.0–100.0)
PLATELETS: 365 10*3/uL (ref 150–400)
RBC: 3.74 MIL/uL — ABNORMAL LOW (ref 3.87–5.11)
RDW: 18.8 % — AB (ref 11.5–15.5)
WBC: 10.8 10*3/uL — AB (ref 4.0–10.5)

## 2014-05-16 LAB — VANCOMYCIN, TROUGH
VANCOMYCIN TR: 11.4 ug/mL (ref 10.0–20.0)
Vancomycin Tr: 21.3 ug/mL — ABNORMAL HIGH (ref 10.0–20.0)

## 2014-05-16 MED ORDER — ENOXAPARIN SODIUM 60 MG/0.6ML ~~LOC~~ SOLN
50.0000 mg | Freq: Two times a day (BID) | SUBCUTANEOUS | Status: DC
  Administered 2014-05-16 – 2014-05-20 (×8): 50 mg via SUBCUTANEOUS
  Filled 2014-05-16 (×9): qty 0.6

## 2014-05-16 MED ORDER — WHITE PETROLATUM GEL
Status: AC
  Filled 2014-05-16: qty 5

## 2014-05-16 MED ORDER — WHITE PETROLATUM GEL
Status: AC
  Administered 2014-05-16: 0.2
  Filled 2014-05-16: qty 5

## 2014-05-16 MED ORDER — METOPROLOL TARTRATE 25 MG PO TABS
25.0000 mg | ORAL_TABLET | Freq: Two times a day (BID) | ORAL | Status: DC
  Administered 2014-05-16 – 2014-05-19 (×6): 25 mg
  Filled 2014-05-16 (×7): qty 1

## 2014-05-16 MED ORDER — WHITE PETROLATUM GEL
Status: AC
  Administered 2014-05-16: 05:00:00
  Filled 2014-05-16: qty 5

## 2014-05-16 MED ORDER — LORAZEPAM 0.5 MG PO TABS: 0.5000 mg | ORAL_TABLET | Freq: Four times a day (QID) | ORAL | Status: DC | PRN

## 2014-05-16 MED ORDER — VANCOMYCIN HCL IN DEXTROSE 750-5 MG/150ML-% IV SOLN
750.0000 mg | Freq: Two times a day (BID) | INTRAVENOUS | Status: DC
  Administered 2014-05-16 – 2014-05-19 (×6): 750 mg via INTRAVENOUS
  Filled 2014-05-16 (×7): qty 150

## 2014-05-16 NOTE — Progress Notes (Signed)
Subjective: Was sedated during day due to ativan overnight. Now waking up   Exam: Filed Vitals:   05/16/14 2010  BP: 156/88  Pulse: 117  Temp:   Resp:    Gen: In bed, NAD MS: more awake today, able to tell me her name, some speech that is not entirely comprehenisble. Follwos commands. Engages me and shake s my hand.  CN: Pupils equal round and reactive, right facial droop. She is able to cross midline both directions with extraocular movements Motor: Has a right hemiparesis Dtr: 2+ and symmetric  Impression: 78 year old female with a history of stroke causing right hemiparesis and aphasia who presented with a total of 2 lifetime seizures, the second of which was status epilepticus. She was on 4 AEDs, tapered to three per the daughter, but was on very large doses. Apparently depakote has been very sedating to the patient.   I would favor planning on keppra and vimpat as her long term AEDs.   Recommendations: 1) recheck dilantin tomorrow am.  2) likely start LEV tomorrow 3) continue vimpat 4) will d/c depakote following starting keppra.  5) alternative anticoagulation until dilantin would no longer be expeted to reduce eliquis level.   Roland Rack, MD [facility] [phone]  If 7pm- 7am, please page neurology on call as listed in [place].

## 2014-05-16 NOTE — Progress Notes (Signed)
Physical Therapy Treatment Patient Details Name: Armani Gawlik MRN: [id number] DOB: 1935-12-14 Today's Date: 05/16/2014    History of Present Illness Patient is a 78 year old female with known history of seizure disorder on multiple seizure medications, history of left MCA CVA, dysphagia with PEG tube was recently admitted at [facility], discharged on 9/14 when she was admitted with status epilepticus. patient was scheduled to have CT of the head and EEG 05/13/14 due to cognitive dysfunction, the patient was brought from the nursing facility by the EMS once found minimally responsive.    PT Comments    Patient session limited significantly by lethargy.  Attempted multiple arousal techniques including EOB stimulation.  Patient unable to sustain arousal.  (may be 2/2 medication administration from agitation bout this am). Will continue to see and progress as tolerated.   Follow Up Recommendations  SNF     Equipment Recommendations  None recommended by PT    Recommendations for Other Services Speech consult     Precautions / Restrictions Precautions Precautions: Fall    Mobility  Bed Mobility Overal bed mobility: Needs Assistance Bed Mobility: Rolling;Sidelying to Sit;Sit to Supine Rolling: Max assist Sidelying to sit: Total assist   Sit to supine: Max assist   General bed mobility comments: Patient assisted to EOB to attempt increased arousal, patient with limited ability to arouse today secondary to medication adminitration (ativan). Patient did demonstrate breif periods of eyes oppened but could not sustain arousal.   Transfers                    Ambulation/Gait                 Stairs            Wheelchair Mobility    Modified Rankin (Stroke Patients Only)       Balance     Sitting balance-Leahy Scale: Poor Sitting balance - Comments: EOB active motion via utilization of stimulus. Patient demonstrates some engagement of  trunk control and use of LLE against resistance. Difficulty sustaining activities secondary to arousal. (Trunk fatigue noted with approximately 8 minutes of EOB work)                            Cognition Arousal/Alertness: [employer] fast streamer;Suspect due to medications Behavior During Therapy: Flat affect Overall Cognitive Status: Difficult to assess     Current Attention Level: Focused         Problem Solving: Slow processing;Requires verbal cues      Exercises      General Comments        Pertinent Vitals/Pain      Home Living                      Prior Function            PT Goals (current goals can now be found in the care plan section) Acute Rehab PT Goals Patient Stated Goal: none stated PT Goal Formulation: With patient/family Time For Goal Achievement: 05/28/14 Potential to Achieve Goals: Fair Progress towards PT goals: Progressing toward goals    Frequency  Min 3X/week    PT Plan Current plan remains appropriate    Co-evaluation             End of Session   Activity Tolerance: Patient limited by lethargy Patient left: in bed;with call bell/phone within reach;with family/visitor present     Time: 1050-1106  PT Time Calculation (min): 16 min  Charges:  $Therapeutic Activity: 8-22 mins                    G CodesDuncan Dull May 27, 2014, 11:15 AM Alben Deeds, PT DPT  [phone]

## 2014-05-16 NOTE — Progress Notes (Signed)
Pharmacy: Lovenox/Vancomycin  78yof on apixaban for hx PE and small thrombus in her right atrium. She is now being switched to full dose lovenox until all of the dilantin clears from her system (ordered a dilantin level for 10/2 AM). Last apixaban dose was today at 1014.  She also continues on day #4 vancomycin for pneumonia. Vancomycin trough tonight is below goal at 11.4 (goal 15-20). Renal function is stable. Will increase dose.   9/28 Vancomycin>> 9/28 Cefepime>>  9/28: BC x 2>> ngtd  Plan: 1) Lovenox 50mg  sq q12 (1mg /kg q12) - first dose at 2200 when next apixaban would have been due 2) Follow up dilantin level tomorrow morning 3) Increase vancomycin to 750mg  IV q12  Nena Jordan, PharmD, BCPS 05/16/2014, 8:22 PM

## 2014-05-16 NOTE — Progress Notes (Addendum)
[facility] Progress Note   Tanya White HYW:[id number] DOB: 08-22-1935 DOA: 05/13/2014 PCP: [name], MD  Brief narrative: Tanya White is a 78 y.o. female with known history of seizure disorder on multiple seizure medications, history of left MCA CVA, dysphagia with PEG tube was recently admitted at [facility], discharged on 9/14 when she was admitted with status epilepticus. During the hospitalization, patient's seizure medications were uptitrated. She was found to have dysphagia and failed swallow screen- PEG tube was placed on 9/4. CTA chest on 9/3 showed a 2 small right lower lobe pulmonary embolus, echocardiogram showed small thrombus in the right atrium- patient was placed on Eliquis.  Subjective: Mumbling. But able to recognize daughter and answering simple questions.   Assessment/Plan: Principal Problem:   Acute encephalopathy - multifactorial - hyponatremia- being corrected to IVF - Dilantin toxicity- holding Dilantin - unwitnessed seizure?  - due to underlying pneumonia?   Active Problems:   Hyponatremia - will need to check on how much fluid she was receiving with the tube feeds at the nursing home - currently improving with NS infusion    Left lower lobe pneumonia? - left lower lobe consolidation with left pl effusion - cont antibiotics- on Vanc and Cefepime - Influenza panel negative  Right lung base nodule on CXR - upright PA/Lat CXR recommended if able to obtain otherwise portable-I have not yet ordered this    Seizure disorder - hold Dilantin - Depakote (level was < 10) - patient's Depakote was held at the nursing home due to a mental status change- it has been resumed here -cont  Vimpat - Neuro is following and assisting with management    Hyperkalemia - improved with IVF    Dilantin toxicity - level only mildly improved from 35 to 34 - cont to follow    H/o left MCA CVA - with thrombus in atrium on prior admission -  Eliquis is being changed to lovenox till we get the dilantin out of her system.   H/o PE last admitted - will start her on lovenox till we get the dilantin out of her system, because of the interaction and contraindication and then restart her on eliquis.     Dysphagia - resume tube feeds     Code Status: DNR Family Communication: none Disposition Plan: return to SNF- DVT prophylaxis: Eliquis  Consultants: neuro  Procedures: none  Antibiotics: Anti-infectives   Start     Dose/Rate Route Frequency Ordered Stop   05/13/14 2000  vancomycin (VANCOCIN) 500 mg in sodium chloride 0.9 % 100 mL IVPB     500 mg 100 mL/hr over 60 Minutes Intravenous Every 12 hours 05/13/14 1943     05/13/14 2000  ceFEPIme (MAXIPIME) 1 g in dextrose 5 % 50 mL IVPB     1 g 100 mL/hr over 30 Minutes Intravenous Every 24 hours 05/13/14 1943           Objective: Filed Weights   05/14/14 2200 05/15/14 0426 05/16/14 0422  Weight: 52.6 kg (115 lb 15.4 oz) 52.6 kg (115 lb 15.4 oz) 51.9 kg (114 lb 6.7 oz)    Intake/Output Summary (Last 24 hours) at 05/16/14 1526 Last data filed at 05/16/14 0600  Gross per 24 hour  Intake 4858.17 ml  Output      0 ml  Net 4858.17 ml     Vitals Filed Vitals:   05/16/14 0310 05/16/14 0422 05/16/14 0727 05/16/14 1200  BP: 144/67  128/55   Pulse: 115  108  Temp: 98.7 F (37.1 C)  98.4 F (36.9 C) 98.2 F (36.8 C)  TempSrc: Axillary  Axillary Axillary  Resp: 18  21   Height:      Weight:  51.9 kg (114 lb 6.7 oz)    SpO2: 100%  100%     Exam: General: No acute respiratory distress- minimally responsive- confused Lungs: Clear to auscultation bilaterally without wheezes or crackles Cardiovascular: Regular rate and rhythm without murmur gallop or rub normal S1 and S2 Abdomen: Nontender, nondistended, soft, bowel sounds positive, no rebound, no ascites, no appreciable mass Extremities: No significant cyanosis, clubbing, or edema bilateral lower  extremities  Data Reviewed: Basic Metabolic Panel:  Recent Labs Lab 05/13/14 1339 05/14/14 0840 05/15/14 0258 05/16/14 0340  NA 124* 130* 130* 133*  K 5.5* 5.0 4.5 4.4  CL 85* 92* 96 95*  CO2 30 27 25 26   GLUCOSE 115* 114* 103* 110*  BUN 22 14 14 11   CREATININE 0.59 0.39* 0.43* 0.37*  CALCIUM 8.6 8.6 7.9* 8.4   Liver Function Tests:  Recent Labs Lab 05/13/14 1339 05/14/14 0840  AST 23 20  ALT 11 10  ALKPHOS 377* 365*  BILITOT 0.3 0.3  PROT 6.8 6.9  ALBUMIN 2.8* 3.0*   No results found for this basename: LIPASE, AMYLASE,  in the last 168 hours No results found for this basename: AMMONIA,  in the last 168 hours CBC:  Recent Labs Lab 05/13/14 1339 05/14/14 0840 05/15/14 0258 05/15/14 1133 05/16/14 0340  WBC 9.9 9.3 7.6  --  10.8*  NEUTROABS 5.7  --   --   --   --   HGB 11.6* 11.6* 9.5* 10.5* 10.7*  HCT 34.1* 33.5* 27.9* 31.5* 31.9*  MCV 83.8 83.8 84.8  --  85.3  PLT 436* 372 335  --  365   Cardiac Enzymes: No results found for this basename: CKTOTAL, CKMB, CKMBINDEX, TROPONINI,  in the last 168 hours BNP (last 3 results) No results found for this basename: PROBNP,  in the last 8760 hours CBG:  Recent Labs Lab 05/15/14 1925 05/15/14 2311 05/16/14 0309 05/16/14 0725 05/16/14 1254  GLUCAP 125* 153* 109* 125* 139*    Recent Results (from the past 240 hour(s))  CULTURE, BLOOD (ROUTINE X 2)     Status: None   Collection Time    05/13/14  9:14 PM      Result Value Ref Range Status   Specimen Description BLOOD RIGHT HAND   Final   Special Requests BOTTLES DRAWN AEROBIC ONLY 4CC   Final   Culture  Setup Time     Final   Value: 05/14/2014 00:29     Performed at [facility]   Culture     Final   Value:        BLOOD CULTURE RECEIVED NO GROWTH TO DATE CULTURE WILL BE HELD FOR 5 DAYS BEFORE ISSUING A FINAL NEGATIVE REPORT     Performed at [facility]   Report Status PENDING   Incomplete  CULTURE, BLOOD (ROUTINE X 2)     Status: None    Collection Time    05/13/14  9:27 PM      Result Value Ref Range Status   Specimen Description BLOOD LEFT ARM   Final   Special Requests BOTTLES DRAWN AEROBIC ONLY 3CC   Final   Culture  Setup Time     Final   Value: 05/14/2014 00:30     Performed at [facility]  Final   Value:        BLOOD CULTURE RECEIVED NO GROWTH TO DATE CULTURE WILL BE HELD FOR 5 DAYS BEFORE ISSUING A FINAL NEGATIVE REPORT     Performed at [facility]   Report Status PENDING   Incomplete  MRSA PCR SCREENING     Status: None   Collection Time    05/13/14 10:05 PM      Result Value Ref Range Status   MRSA by PCR NEGATIVE  NEGATIVE Final   Comment:            The GeneXpert MRSA Assay (FDA     approved for NASAL specimens     only), is one component of a     comprehensive MRSA colonization     surveillance program. It is not     intended to diagnose MRSA     infection nor to guide or     monitor treatment for     MRSA infections.     Studies:  Recent x-ray studies have been reviewed in detail by the Attending Physician  Scheduled Meds:  Scheduled Meds: . apixaban  5 mg Per Tube BID  . ceFEPime (MAXIPIME) IV  1 g Intravenous Q24H  . famotidine  20 mg Per Tube BID  . feeding supplement (PRO-STAT SUGAR FREE 64)  30 mL Per Tube Q1500  . free water  150 mL Per Tube 4 times per day  . lacosamide  150 mg Per Tube BID  . Valproic Acid  500 mg Per Tube QHS  . vancomycin  500 mg Intravenous Q12H   Continuous Infusions: . sodium chloride 100 mL/hr at 05/15/14 1800  . feeding supplement (JEVITY 1.2 CAL) 45 mL/hr at 05/15/14 2358    Time spent on care of this patient: >35 min   Doyle Tegethoff, MD 05/16/2014, 3:26 PM  LOS: 3 days   [facility] Office  [phone] Pager - Text Page per [email or web address]  If 7PM-7AM, please contact night-coverage [email or web address]

## 2014-05-16 NOTE — [place] (Signed)
ANTIBIOTIC CONSULT NOTE - FOLLOW UP  Pharmacy Consult for Vancomycin Indication: pneumonia  Allergies  Allergen Reactions  . Aspirin Other (See Comments)    REACTION: unknown  . Codeine Other (See Comments)    REACTION: unknown  . Hctz [Hydrochlorothiazide] Other (See Comments)    REACTION: unknown  . Hydralazine Other (See Comments)    REACTION: unknown  . Lipitor [Atorvastatin] Other (See Comments)    REACTION: unknown  . Reserpine Other (See Comments)    REACTION: unknown  . Tramadol Other (See Comments)    REACTION: unknown    Patient Measurements: Height: 5\' 1"  (154.9 cm) Weight: 114 lb 6.7 oz (51.9 kg) IBW/kg (Calculated) : 47.8 Adjusted Body Weight:   Vital Signs: Temp: 98.4 F (36.9 C) (10/01 0727) Temp src: Axillary (10/01 0727) BP: 128/55 mmHg (10/01 0727) Pulse Rate: 108 (10/01 0727) Intake/Output from previous day: 09/30 0701 - 10/01 0700 In: 4858.2 [I.V.:3500; NG/GT:1358.2] Out: 5 [Drains:5] Intake/Output from this shift:    Labs:  Recent Labs  05/14/14 0840 05/15/14 0258 05/15/14 1133 05/16/14 0340  WBC 9.3 7.6  --  10.8*  HGB 11.6* 9.5* 10.5* 10.7*  PLT 372 335  --  365  CREATININE 0.39* 0.43*  --  0.37*   Estimated Creatinine Clearance: 45.1 ml/min (by C-G formula based on Cr of 0.37).  Recent Labs  05/16/14 0950  VANCOTROUGH 21.3*     Microbiology: Recent Results (from the past 720 hour(s))  CULTURE, BLOOD (ROUTINE X 2)     Status: None   Collection Time    05/13/14  9:14 PM      Result Value Ref Range Status   Specimen Description BLOOD RIGHT HAND   Final   Special Requests BOTTLES DRAWN AEROBIC ONLY 4CC   Final   Culture  Setup Time     Final   Value: 05/14/2014 00:29     Performed at [facility]   Culture     Final   Value:        BLOOD CULTURE RECEIVED NO GROWTH TO DATE CULTURE WILL BE HELD FOR 5 DAYS BEFORE ISSUING A FINAL NEGATIVE REPORT     Performed at [facility]   Report Status PENDING    Incomplete  CULTURE, BLOOD (ROUTINE X 2)     Status: None   Collection Time    05/13/14  9:27 PM      Result Value Ref Range Status   Specimen Description BLOOD LEFT ARM   Final   Special Requests BOTTLES DRAWN AEROBIC ONLY 3CC   Final   Culture  Setup Time     Final   Value: 05/14/2014 00:30     Performed at [facility]   Culture     Final   Value:        BLOOD CULTURE RECEIVED NO GROWTH TO DATE CULTURE WILL BE HELD FOR 5 DAYS BEFORE ISSUING A FINAL NEGATIVE REPORT     Performed at [facility]   Report Status PENDING   Incomplete  MRSA PCR SCREENING     Status: None   Collection Time    05/13/14 10:05 PM      Result Value Ref Range Status   MRSA by PCR NEGATIVE  NEGATIVE Final   Comment:            The GeneXpert MRSA Assay (FDA     approved for NASAL specimens     only), is one component of a  comprehensive MRSA colonization     surveillance program. It is not     intended to diagnose MRSA     infection nor to guide or     monitor treatment for     MRSA infections.    Anti-infectives   Start     Dose/Rate Route Frequency Ordered Stop   05/13/14 2000  vancomycin (VANCOCIN) 500 mg in sodium chloride 0.9 % 100 mL IVPB     500 mg 100 mL/hr over 60 Minutes Intravenous Every 12 hours 05/13/14 1943     05/13/14 2000  ceFEPIme (MAXIPIME) 1 g in dextrose 5 % 50 mL IVPB     1 g 100 mL/hr over 30 Minutes Intravenous Every 24 hours 05/13/14 1943        Assessment:  Patient is a 78 y.o F presented to the ED from [facility] secondary to [place]. To start vancomycin and cefepime for PNA. Scr 0.59  Anticoagulation: hx PE and small thrombus in the right atrium on apixaban 5mg  bid (dose ok). Hgb 11.6>>9.5.  Infectious Disease: abx for PNA. Afebrile. WBC 10.8 back up. Scr 0.37. 9/28 vanc>> - 10/1 Vanco level 21.3>>(dose given 8/12) 9/28 cefepime>>  9/28: BC x 2>>pending  Cardiovascular: 128/55, HR 108. No CV meds.  Endocrinology: N/A. Glucose  110.  Gastrointestinal / Nutrition: PEG tube. Pepcid/tube, free water, Prostat, Jevity 1.2.  Neurology: hx stroke, Seizures/status epi on phenytoin and vimpat PTA, valprioc acid d/ced on 9/24 at [facility] b/c they thought it was contributing to her AMS. No seizures noted on EEG 9/28 DPH level= 35.5 (corrected for alb = 53.8)--> DPH on hold 9/28 VA= <10. VA was resumed 9/29: EEG reviewed and shows intermittent sharp activity consistent with her history of seizures but no evidence of subclinical seizure activity.   Nephrology: scr 0.37 (crcl~45).   Pulmonary: chest xray with suspicion for PNA  Hematology / Oncology: Anemia with Hgb 10.7  PTA Medication Issues: Phenytoin (high level)  Best Practices: Iron, Lasix, Magox, metoprolol, MV, K+   Goal of Therapy:  Vancomycin trough level 15-20 mcg/ml  Plan:  1) change to cefepime 1gm IV q24h 2)  True Vanco trough tonight 1930   Tanya White S. Alford Highland, PharmD, BCPS Clinical Staff Pharmacist Pager [phone]  Tanya White 05/16/2014,1:27 PM

## 2014-05-16 NOTE — [place] Notes (Signed)
Pt heart rate and respiratory rate have began to increase. Dr. Karleen Hampshire notified new orders received. 12 lead EKG obtained. Also paged rapid response and CCM to update on pt condition. Pt awaiting thoracentesis in AM. Will continue to monitor.     05/16/14 1826  Vitals  BP ! 151/81 mmHg  MAP (mmHg) 95  BP Location Right arm  BP Method Automatic  Patient Position (if appropriate) Lying  Pulse Rate ! 118  Pulse Rate Source Monitor  ECG Heart Rate ! 119  Resp ! 24  Oxygen Therapy  SpO2 95 %  O2 Device Nasal cannula  O2 Flow Rate (L/min) 3 L/min  Pulse Oximetry Type Continuous

## 2014-05-16 NOTE — Care Management Note (Signed)
    Page 1 of 1   05/16/2014     12:39:58 PM CARE MANAGEMENT NOTE 05/16/2014  Patient:  Tanya White,Tanya White   Account Number:  [id number]  Date Initiated:  05/15/2014  Documentation initiated by:  Marvetta Gibbons  Subjective/Objective Assessment:   Pt admitted with acute encephalopathy, dilantin toxicity     Action/Plan:   PTA pt was at SNF- [facility]- CSW following for return to SNF when medically stable   Anticipated DC Date:  05/17/2014   Anticipated DC Plan:  SKILLED NURSING FACILITY  In-house referral  Clinical Social Worker      DC Planning Services  CM consult      Choice offered to / List presented to:             Status of service:  In process, will continue to follow Medicare Important Message given?  YES (If response is "NO", the following Medicare IM given date fields will be blank) Date Medicare IM given:  05/16/2014 Medicare IM given by:  Marvetta Gibbons Date Additional Medicare IM given:   Additional Medicare IM given by:    Discharge Disposition:    Per UR Regulation:  Reviewed for med. necessity/level of care/duration of stay  If discussed at [place] of Stay Meetings, dates discussed:    Comments:

## 2014-05-17 ENCOUNTER — Inpatient Hospital Stay (HOSPITAL_COMMUNITY): Payer: PRIVATE HEALTH INSURANCE

## 2014-05-17 ENCOUNTER — Encounter (HOSPITAL_COMMUNITY): Payer: Self-pay | Admitting: *Deleted

## 2014-05-17 DIAGNOSIS — T420X1D Poisoning by hydantoin derivatives, accidental (unintentional), subsequent encounter: Secondary | ICD-10-CM

## 2014-05-17 LAB — GRAM STAIN

## 2014-05-17 LAB — GLUCOSE, CAPILLARY
GLUCOSE-CAPILLARY: 129 mg/dL — AB (ref 70–99)
GLUCOSE-CAPILLARY: 124 mg/dL — AB (ref 70–99)
Glucose-Capillary: 107 mg/dL — ABNORMAL HIGH (ref 70–99)
Glucose-Capillary: 129 mg/dL — ABNORMAL HIGH (ref 70–99)
Glucose-Capillary: 129 mg/dL — ABNORMAL HIGH (ref 70–99)
Glucose-Capillary: 136 mg/dL — ABNORMAL HIGH (ref 70–99)

## 2014-05-17 LAB — BODY FLUID CELL COUNT WITH DIFFERENTIAL
EOS FL: NONE SEEN %
Lymphs, Fluid: 8 %
Monocyte-Macrophage-Serous Fluid: 43 % — ABNORMAL LOW (ref 50–90)
Neutrophil Count, Fluid: 49 % — ABNORMAL HIGH (ref 0–25)
Total Nucleated Cell Count, Fluid: 398 cu mm (ref 0–1000)

## 2014-05-17 LAB — LACTATE DEHYDROGENASE, PLEURAL OR PERITONEAL FLUID: LD, Fluid: 84 U/L — ABNORMAL HIGH (ref 3–23)

## 2014-05-17 LAB — ALBUMIN, FLUID (OTHER): Albumin, Fluid: 2 g/dL

## 2014-05-17 LAB — PROCALCITONIN: Procalcitonin: <0.1 ng/mL

## 2014-05-17 LAB — PHENYTOIN LEVEL, TOTAL: PHENYTOIN LVL: 18.9 ug/mL (ref 10.0–20.0)

## 2014-05-17 MED ORDER — LEVETIRACETAM 500 MG PO TABS
500.0000 mg | ORAL_TABLET | Freq: Two times a day (BID) | ORAL | Status: DC
  Administered 2014-05-17 – 2014-05-20 (×6): 500 mg via ORAL
  Filled 2014-05-17 (×7): qty 1

## 2014-05-17 MED ORDER — LIDOCAINE HCL (PF) 1 % IJ SOLN
INTRAMUSCULAR | Status: AC
  Filled 2014-05-17: qty 10

## 2014-05-17 NOTE — [place] Notes (Signed)
[facility] Progress Note   Tanya White ZJQ:[id number] DOB: 05/12/1936 DOA: 05/13/2014 PCP: [name], MD  Brief narrative: Tanya White is a 78 y.o. female with known history of seizure disorder on multiple seizure medications, history of left MCA CVA, dysphagia with PEG tube was recently admitted at [facility], discharged on 9/14 when she was admitted with status epilepticus. During the hospitalization, patient's seizure medications were uptitrated. She was found to have dysphagia and failed swallow screen- PEG tube was placed on 9/4. CTA chest on 9/3 showed a 2 small right lower lobe pulmonary embolus, echocardiogram showed small thrombus in the right atrium- patient was placed on Eliquis.  Subjective: Mumbling. But able to recognize daughter and answering simple questions.   Assessment/Plan: Principal Problem:   Acute encephalopathy - multifactorial - hyponatremia- being corrected to IVF - Dilantin toxicity- holding Dilantin - unwitnessed seizure?  - due to underlying pneumonia?   Active Problems:   Hyponatremia - will need to check on how much fluid she was receiving with the tube feeds at the nursing home - currently improving with NS infusion    Left lower lobe pneumonia? - left lower lobe consolidation with left pl effusion - cont antibiotics- on Vanc and Cefepime - Influenza panel negative  Right lung base nodule on CXR - upright PA/Lat CXR recommended if able to obtain otherwise portable-I have not yet ordered this    Seizure disorder - hold Dilantin - Depakote (level was < 10) - patient's Depakote was held at the nursing home due to a mental status change- it has been resumed here -cont  Vimpat - Neuro is following and assisting with management    Hyperkalemia - improved with IVF    Dilantin toxicity - level only mildly improved from 35 to 34 - cont to follow    H/o left MCA CVA - with thrombus in atrium on prior admission -  Eliquis is being changed to lovenox till we get the dilantin out of her system.   H/o PE last admission - will start her on lovenox till we get the dilantin out of her system, because of the interaction and contraindication and then restart her on eliquis.     Dysphagia - resume tube feeds  Left pleural effusion: s/p thoracocentesis and fluid sent for analysis.  Resume antibiotics and repeat CXR shows marked reduction in the pleural fluid.      Code Status: DNR Family Communication: none Disposition Plan: return to SNF- DVT prophylaxis: Eliquis  Consultants: Neuro   Procedures: [place] THORACOCENTESIS.   Antibiotics: Anti-infectives   Start     Dose/Rate Route Frequency Ordered Stop   05/16/14 2030  vancomycin (VANCOCIN) IVPB 750 mg/150 ml premix     750 mg 150 mL/hr over 60 Minutes Intravenous Every 12 hours 05/16/14 2016     05/13/14 2000  vancomycin (VANCOCIN) 500 mg in sodium chloride 0.9 % 100 mL IVPB  Status:  Discontinued     500 mg 100 mL/hr over 60 Minutes Intravenous Every 12 hours 05/13/14 1943 05/16/14 2016   05/13/14 2000  ceFEPIme (MAXIPIME) 1 g in dextrose 5 % 50 mL IVPB     1 g 100 mL/hr over 30 Minutes Intravenous Every 24 hours 05/13/14 1943           Objective: Filed Weights   05/15/14 0426 05/16/14 0422 05/17/14 0252  Weight: 52.6 kg (115 lb 15.4 oz) 51.9 kg (114 lb 6.7 oz) 53.7 kg (118 lb 6.2 oz)    Intake/Output Summary (  Last 24 hours) at 05/17/14 1650 Last data filed at 05/17/14 1600  Gross per 24 hour  Intake   3695 ml  Output      0 ml  Net   3695 ml     Vitals Filed Vitals:   05/17/14 0735 05/17/14 1100 05/17/14 1215 05/17/14 1640  BP: 158/75 134/62    Pulse: 118 119    Temp:   98 F (36.7 C) 98.1 F (36.7 C)  TempSrc:   Axillary Axillary  Resp: 27 22    Height:      Weight:      SpO2: 100% 99%      Exam: General: No acute respiratory distress- minimally responsive- confused Lungs: Clear to auscultation bilaterally  without wheezes or crackles Cardiovascular: Regular rate and rhythm without murmur gallop or rub normal S1 and S2 Abdomen: Nontender, nondistended, soft, bowel sounds positive, no rebound, no ascites, no appreciable mass Extremities: No significant cyanosis, clubbing, or edema bilateral lower extremities  Data Reviewed: Basic Metabolic Panel:  Recent Labs Lab 05/13/14 1339 05/14/14 0840 05/15/14 0258 05/16/14 0340  NA 124* 130* 130* 133*  K 5.5* 5.0 4.5 4.4  CL 85* 92* 96 95*  CO2 30 27 25 26   GLUCOSE 115* 114* 103* 110*  BUN 22 14 14 11   CREATININE 0.59 0.39* 0.43* 0.37*  CALCIUM 8.6 8.6 7.9* 8.4   Liver Function Tests:  Recent Labs Lab 05/13/14 1339 05/14/14 0840  AST 23 20  ALT 11 10  ALKPHOS 377* 365*  BILITOT 0.3 0.3  PROT 6.8 6.9  ALBUMIN 2.8* 3.0*   No results found for this basename: LIPASE, AMYLASE,  in the last 168 hours No results found for this basename: AMMONIA,  in the last 168 hours CBC:  Recent Labs Lab 05/13/14 1339 05/14/14 0840 05/15/14 0258 05/15/14 1133 05/16/14 0340  WBC 9.9 9.3 7.6  --  10.8*  NEUTROABS 5.7  --   --   --   --   HGB 11.6* 11.6* 9.5* 10.5* 10.7*  HCT 34.1* 33.5* 27.9* 31.5* 31.9*  MCV 83.8 83.8 84.8  --  85.3  PLT 436* 372 335  --  365   Cardiac Enzymes: No results found for this basename: CKTOTAL, CKMB, CKMBINDEX, TROPONINI,  in the last 168 hours BNP (last 3 results) No results found for this basename: PROBNP,  in the last 8760 hours CBG:  Recent Labs Lab 05/17/14 0014 05/17/14 0302 05/17/14 0732 05/17/14 1105 05/17/14 1626  GLUCAP 129* 107* 136* 129* 124*    Recent Results (from the past 240 hour(s))  CULTURE, BLOOD (ROUTINE X 2)     Status: None   Collection Time    05/13/14  9:14 PM      Result Value Ref Range Status   Specimen Description BLOOD RIGHT HAND   Final   Special Requests BOTTLES DRAWN AEROBIC ONLY 4CC   Final   Culture  Setup Time     Final   Value: 05/14/2014 00:29     Performed at  [facility]   Culture     Final   Value:        BLOOD CULTURE RECEIVED NO GROWTH TO DATE CULTURE WILL BE HELD FOR 5 DAYS BEFORE ISSUING A FINAL NEGATIVE REPORT     Performed at [facility]   Report Status PENDING   Incomplete  CULTURE, BLOOD (ROUTINE X 2)     Status: None   Collection Time    05/13/14  9:27 PM  Result Value Ref Range Status   Specimen Description BLOOD LEFT ARM   Final   Special Requests BOTTLES DRAWN AEROBIC ONLY 3CC   Final   Culture  Setup Time     Final   Value: 05/14/2014 00:30     Performed at [facility]   Culture     Final   Value:        BLOOD CULTURE RECEIVED NO GROWTH TO DATE CULTURE WILL BE HELD FOR 5 DAYS BEFORE ISSUING A FINAL NEGATIVE REPORT     Performed at [facility]   Report Status PENDING   Incomplete  MRSA PCR SCREENING     Status: None   Collection Time    05/13/14 10:05 PM      Result Value Ref Range Status   MRSA by PCR NEGATIVE  NEGATIVE Final   Comment:            The GeneXpert MRSA Assay (FDA     approved for NASAL specimens     only), is one component of a     comprehensive MRSA colonization     surveillance program. It is not     intended to diagnose MRSA     infection nor to guide or     monitor treatment for     MRSA infections.  GRAM STAIN     Status: None   Collection Time    05/17/14  9:42 AM      Result Value Ref Range Status   Specimen Description PLEURAL FLUID LEFT   Final   Special Requests 60ML FLUID   Final   Gram Stain     Final   Value: CYTOSPIN PREP     WBC PRESENT,BOTH PMN AND MONONUCLEAR     NO ORGANISMS SEEN   Report Status 05/17/2014 FINAL   Final  BODY FLUID CULTURE     Status: None   Collection Time    05/17/14  9:42 AM      Result Value Ref Range Status   Specimen Description PLEURAL FLUID LEFT   Final   Special Requests 60ML FLUID   Final   Gram Stain     Final   Value: CYTOSPIN WBC PRESENT,BOTH PMN AND MONONUCLEAR     NO ORGANISMS SEEN     Performed at  [facility]     Performed at [facility]   Culture PENDING   Incomplete   Report Status PENDING   Incomplete     Studies:  Recent x-ray studies have been reviewed in detail by the Attending Physician  Scheduled Meds:  Scheduled Meds: . ceFEPime (MAXIPIME) IV  1 g Intravenous Q24H  . enoxaparin (LOVENOX) injection  50 mg Subcutaneous Q12H  . famotidine  20 mg Per Tube BID  . feeding supplement (PRO-STAT SUGAR FREE 64)  30 mL Per Tube Q1500  . free water  150 mL Per Tube 4 times per day  . lacosamide  150 mg Per Tube BID  . lidocaine (PF)      . metoprolol tartrate  25 mg Per Tube BID  . Valproic Acid  500 mg Per Tube QHS  . vancomycin  750 mg Intravenous Q12H   Continuous Infusions: . sodium chloride 100 mL/hr at 05/17/14 1141  . feeding supplement (JEVITY 1.2 CAL) 1,000 mL (05/17/14 1600)    Time spent on care of this patient: >35 min   Tanya Mohamed, MD 05/17/2014, 4:50 PM  LOS: 4 days   [facility]  Office  [phone] Pager - Text Page per [email or web address]  If 7PM-7AM, please contact night-coverage [email or web address]

## 2014-05-17 NOTE — [place] (Signed)
Subjective: More awake today, talking more  Exam: Filed Vitals:   05/17/14 1640  BP:   Pulse:   Temp: 98.1 F (36.7 C)  Resp:    Gen: In bed, NAD MS: Answers "I'm fine" and able to follow commands. Still with some aphasia, though improving compared to previous days.  CN: Pupils equal round and reactive, right facial droop. She is able to cross midline both directions with extraocular movements Motor: Has a right hemiparesis Dtr: 2+ and symmetric  Impression: 78 year old female with a history of stroke causing right hemiparesis and aphasia who presented with a total of 2 lifetime seizures, the second of which was status epilepticus. She was on 4 AEDs, tapered to three per the daughter, but was on very large doses. Apparently depakote has been very sedating to the patient.   I would favor planning on keppra and vimpat as her long term AEDs.   Recommendations: 1) Continue vimpat 150mg  BID 2) start keppra 500mg  BID 3) d/c depakote  Roland Rack, MD [facility] [phone]  If 7pm- 7am, please page neurology on call as listed in [place].

## 2014-05-17 NOTE — [place] (Signed)
Spoke with someone this AM in ultrasound and let them know pt didn't have an IV. They were ok with this and said they didn't need it anyway.

## 2014-05-17 NOTE — Procedures (Signed)
Successful US guided left thoracentesis. Yielded 1 liter of clear yellow fluid. Pt tolerated procedure well. No immediate complications.  Specimen was sent for labs. CXR ordered.  Tsosie Billing D PA-C 05/17/2014 9:47 AM

## 2014-05-18 ENCOUNTER — Inpatient Hospital Stay (HOSPITAL_COMMUNITY): Payer: PRIVATE HEALTH INSURANCE

## 2014-05-18 DIAGNOSIS — E871 Hypo-osmolality and hyponatremia: Secondary | ICD-10-CM

## 2014-05-18 LAB — CBC
HEMATOCRIT: 27.6 % — AB (ref 36.0–46.0)
Hemoglobin: 9.5 g/dL — ABNORMAL LOW (ref 12.0–15.0)
MCH: 30.3 pg (ref 26.0–34.0)
MCHC: 34.4 g/dL (ref 30.0–36.0)
MCV: 87.9 fL (ref 78.0–100.0)
Platelets: 258 10*3/uL (ref 150–400)
RBC: 3.14 MIL/uL — ABNORMAL LOW (ref 3.87–5.11)
RDW: 18.4 % — AB (ref 11.5–15.5)
WBC: 8.7 10*3/uL (ref 4.0–10.5)

## 2014-05-18 LAB — GLUCOSE, CAPILLARY
GLUCOSE-CAPILLARY: 133 mg/dL — AB (ref 70–99)
GLUCOSE-CAPILLARY: 124 mg/dL — AB (ref 70–99)
GLUCOSE-CAPILLARY: 141 mg/dL — AB (ref 70–99)
GLUCOSE-CAPILLARY: 96 mg/dL (ref 70–99)
Glucose-Capillary: 123 mg/dL — ABNORMAL HIGH (ref 70–99)
Glucose-Capillary: 131 mg/dL — ABNORMAL HIGH (ref 70–99)

## 2014-05-18 LAB — BASIC METABOLIC PANEL
Anion gap: 10 (ref 5–15)
BUN: 9 mg/dL (ref 6–23)
CALCIUM: 6.7 mg/dL — AB (ref 8.4–10.5)
CHLORIDE: 104 meq/L (ref 96–112)
CO2: 24 mEq/L (ref 19–32)
CREATININE: 0.27 mg/dL — AB (ref 0.50–1.10)
GFR calc non Af Amer: >90 mL/min (ref >90)
Glucose, Bld: 100 mg/dL — ABNORMAL HIGH (ref 70–99)
Potassium: 3.4 mEq/L — ABNORMAL LOW (ref 3.7–5.3)
Sodium: 138 mEq/L (ref 137–147)

## 2014-05-18 LAB — CLOSTRIDIUM DIFFICILE BY PCR: Toxigenic C. Difficile by PCR: NEGATIVE

## 2014-05-18 LAB — ALBUMIN: ALBUMIN: 2.2 g/dL — AB (ref 3.5–5.2)

## 2014-05-18 MED ORDER — POTASSIUM CHLORIDE 20 MEQ/15ML (10%) PO LIQD
40.0000 meq | Freq: Two times a day (BID) | ORAL | Status: AC
  Administered 2014-05-18 (×2): 40 meq
  Filled 2014-05-18 (×3): qty 30

## 2014-05-18 NOTE — Progress Notes (Addendum)
History:  78 year old female who presented with AMS in the setting of a supra therapeutic Dilantin level and hyponatremia as well as a previous history of stroke causing right hemiparesis and aphasia. History includes a total of 2 lifetime seizures, the second of which was status epilepticus. She was on 4 AEDs, tapered to three per the daughter, but was on very large doses. Apparently depakote has been very sedating to the patient and has been discontinued. Keppra and vimpat have been recommended as her long term AEDs. Phenytoin level from 05/17/2014 is 18.9. Per pharmacy Dilantin level corrects to 27. Na today is normal at 138. Pt improving but remains confused.   Subjective: The pt's niece is here today. She feels her aunt is doing much better. She has had some short conversations with her. Discussed plan for medication adjustments. Niece appears to have a good understanding of the situation.  Objective: Current vital signs: BP 142/69  Pulse 112  Temp(Src) 98.4 F (36.9 C) (Axillary)  Resp 20  Ht 5\' 5"  (1.651 m)  Wt 121 lb 11.1 oz (55.2 kg)  BMI 20.25 kg/m2  SpO2 98% Vital signs in last 24 hours: Temp:  [97.5 F (36.4 C)-98.9 F (37.2 C)] 98.4 F (36.9 C) (10/03 0700) Pulse Rate:  [104-117] 112 (10/03 0740) Resp:  [18-22] 20 (10/03 0740) BP: (142-149)/(67-80) 142/69 mmHg (10/03 0740) SpO2:  [96 %-100 %] 98 % (10/03 0740) Weight:  [121 lb 11.1 oz (55.2 kg)] 121 lb 11.1 oz (55.2 kg) (10/03 0443)  Intake/Output from previous day: 10/02 0701 - 10/03 0700 In: 3474 [I.V.:2400; NG/GT:1335; IV Piggyback:150] Out: -  Intake/Output this shift: Total I/O In: 145 [I.V.:100; NG/GT:45] Out: -  Nutritional status: NPO   Neurologic Exam:   Limited exam  Awake but easily falls back to sleep. Oriented to [facility]. Difficult to evaluate MS due to residual aphasia from stroke. Follows only some commands    Lab Results: Basic Metabolic Panel:  Recent Labs Lab 05/13/14 1339  05/14/14 0840 05/15/14 0258 05/16/14 0340 05/18/14 0340  NA 124* 130* 130* 133* 138  K 5.5* 5.0 4.5 4.4 3.4*  CL 85* 92* 96 95* 104  CO2 30 27 25 26 24   GLUCOSE 115* 114* 103* 110* 100*  BUN 22 14 14 11 9   CREATININE 0.59 0.39* 0.43* 0.37* 0.27*  CALCIUM 8.6 8.6 7.9* 8.4 6.7*    Liver Function Tests:  Recent Labs Lab 05/13/14 1339 05/14/14 0840  AST 23 20  ALT 11 10  ALKPHOS 377* 365*  BILITOT 0.3 0.3  PROT 6.8 6.9  ALBUMIN 2.8* 3.0*   No results found for this basename: LIPASE, AMYLASE,  in the last 168 hours No results found for this basename: AMMONIA,  in the last 168 hours  CBC:  Recent Labs Lab 05/13/14 1339 05/14/14 0840 05/15/14 0258 05/15/14 1133 05/16/14 0340 05/18/14 0340  WBC 9.9 9.3 7.6  --  10.8* 8.7  NEUTROABS 5.7  --   --   --   --   --   HGB 11.6* 11.6* 9.5* 10.5* 10.7* 9.5*  HCT 34.1* 33.5* 27.9* 31.5* 31.9* 27.6*  MCV 83.8 83.8 84.8  --  85.3 87.9  PLT 436* 372 335  --  365 258    Cardiac Enzymes: No results found for this basename: CKTOTAL, CKMB, CKMBINDEX, TROPONINI,  in the last 168 hours  Lipid Panel: No results found for this basename: CHOL, TRIG, HDL, CHOLHDL, VLDL, LDLCALC,  in the last 168 hours  CBG:  Recent  Labs Lab 05/17/14 1626 05/17/14 1946 05/17/14 2308 05/18/14 0413 05/18/14 0738  GLUCAP 124* 129* 133* 123* 83*    Microbiology: Results for orders placed during the hospital encounter of 05/13/14  CULTURE, BLOOD (ROUTINE X 2)     Status: None   Collection Time    05/13/14  9:14 PM      Result Value Ref Range Status   Specimen Description BLOOD RIGHT HAND   Final   Special Requests BOTTLES DRAWN AEROBIC ONLY 4CC   Final   Culture  Setup Time     Final   Value: 05/14/2014 00:29     Performed at [facility]   Culture     Final   Value:        BLOOD CULTURE RECEIVED NO GROWTH TO DATE CULTURE WILL BE HELD FOR 5 DAYS BEFORE ISSUING A FINAL NEGATIVE REPORT     Performed at [facility]   Report  Status PENDING   Incomplete  CULTURE, BLOOD (ROUTINE X 2)     Status: None   Collection Time    05/13/14  9:27 PM      Result Value Ref Range Status   Specimen Description BLOOD LEFT ARM   Final   Special Requests BOTTLES DRAWN AEROBIC ONLY 3CC   Final   Culture  Setup Time     Final   Value: 05/14/2014 00:30     Performed at [facility]   Culture     Final   Value:        BLOOD CULTURE RECEIVED NO GROWTH TO DATE CULTURE WILL BE HELD FOR 5 DAYS BEFORE ISSUING A FINAL NEGATIVE REPORT     Performed at [facility]   Report Status PENDING   Incomplete  MRSA PCR SCREENING     Status: None   Collection Time    05/13/14 10:05 PM      Result Value Ref Range Status   MRSA by PCR NEGATIVE  NEGATIVE Final   Comment:            The GeneXpert MRSA Assay (FDA     approved for NASAL specimens     only), is one component of a     comprehensive MRSA colonization     surveillance program. It is not     intended to diagnose MRSA     infection nor to guide or     monitor treatment for     MRSA infections.  GRAM STAIN     Status: None   Collection Time    05/17/14  9:42 AM      Result Value Ref Range Status   Specimen Description PLEURAL FLUID LEFT   Final   Special Requests 60ML FLUID   Final   Gram Stain     Final   Value: CYTOSPIN PREP     WBC PRESENT,BOTH PMN AND MONONUCLEAR     NO ORGANISMS SEEN   Report Status 05/17/2014 FINAL   Final  BODY FLUID CULTURE     Status: None   Collection Time    05/17/14  9:42 AM      Result Value Ref Range Status   Specimen Description PLEURAL FLUID LEFT   Final   Special Requests 60ML FLUID   Final   Gram Stain     Final   Value: CYTOSPIN WBC PRESENT,BOTH PMN AND MONONUCLEAR     NO ORGANISMS SEEN     Performed at [facility]  Performed at [facility] PENDING   Incomplete   Report Status PENDING   Incomplete    Coagulation Studies: No results found for this basename: LABPROT, INR,  in the last 72  hours  Imaging:  Dg Chest 1 View 05/17/2014    1. No evidence of pneumothorax following left thoracentesis.  2. Marked reduction in pleural fluid on the left.      Dg Chest Port 1 View 05/16/2014    1. Large left pleural effusion fills 80% of the hemithorax. Associated passive atelectasis noted.      US Thoracentesis Asp Pleural Space W/img Guide 05/17/2014    Successful ultrasound guided left thoracentesis yielding 1 liter of pleural fluid.    Phenytoin level from 05/17/2014 is 18.9. Per pharmacy Dilantin level corrects to 27.   Na today is normal at 138.  EEG 05/14/2014 This is an abnormal EEG secondary to general background slowing. This finding may be seen with a diffuse disturbance that is etiologically nonspecific, but may include a metabolic encephalopathy, among other possibilities. There is sharp activity noted over the right hemisphere consistent with the patient's history of seizures. There was no evidence of subclinical seizure activity.     Medications:  Scheduled: . ceFEPime (MAXIPIME) IV  1 g Intravenous Q24H  . enoxaparin (LOVENOX) injection  50 mg Subcutaneous Q12H  . famotidine  20 mg Per Tube BID  . feeding supplement (PRO-STAT SUGAR FREE 64)  30 mL Per Tube Q1500  . free water  150 mL Per Tube 4 times per day  . lacosamide  150 mg Per Tube BID  . levETIRAcetam  500 mg Oral BID  . metoprolol tartrate  25 mg Per Tube BID  . vancomycin  750 mg Intravenous Q12H     Lowry Ram [facility] Pager [phone] 05/18/2014, 11:55 AM  Assessment/Plan:  78 year old female with a history of stroke causing right hemiparesis and aphasia as well as seizures.   Her encephalopathy on admission was due to Dilantin. Her actual level was 50, but with a low albumin as well as concomittent Depakote this was asked much higher. I suspect that much of her sedation for the past month has been medication toxicity and she was on during high doses of  Dilantin.   I would favor continuing her on Keppra and vimpat from here on out and holding Depakote and Dilantin.  1) continue Keppra 500 mg twice a day 2) continue vimpat 150 mg twice a day 3) I suspect that she will likely need rehabilitation and was in a skilled nursing facility for this. I suspect to be much more able to cooperate with rehabilitation now that she is no longer Dilantin toxic  Roland Rack, MD [facility] [phone]  If 7pm- 7am, please page neurology on call as listed in [place].

## 2014-05-18 NOTE — Progress Notes (Signed)
[facility] Progress Note   Tanya White SXJ:[id number] DOB: [birth year] DOA: 05/13/2014 PCP: [name], MD  Brief narrative: Tanya White is a 78 y.o. female with known history of seizure disorder on multiple seizure medications, history of left MCA CVA, dysphagia with PEG tube was recently admitted at [facility], discharged on 9/14 when she was admitted with status epilepticus. During the hospitalization, patient's seizure medications were uptitrated. She was found to have dysphagia and failed swallow screen- PEG tube was placed on 9/4. CTA chest on 9/3 showed a 2 small right lower lobe pulmonary embolus, echocardiogram showed small thrombus in the right atrium- patient was placed on Eliquis.  Subjective: Mumbling. But able to recognize daughter and answering simple questions. Is  More awake today. Loose stools.   Assessment/Plan: Principal Problem:   Acute encephalopathy - multifactorial - hyponatremia- being corrected to IVF - Dilantin toxicity- holding Dilantin - unwitnessed seizure?  - due to underlying pneumonia?  - much improved when compared to the state on admission  Active Problems:   Hyponatremia - resolved with adequate NS hydration.     Left lower lobe pneumonia? - left lower lobe consolidation with left pl effusion - she was started on broad spectrum antibiotics, day 6 of total antibiotics. Plan for CXR in am and d/c antibiotics after 7 days.  - Influenza panel negative  Right lung base nodule on CXR - outpatient follow up with CT chest to follow up on the right lung base nodule.     Seizure disorder - hold Dilantin - Depakote (level was < 10) - patient's Depakote was held at the nursing home due to a mental status change- it was initially started and discontinued on 10/2 by neurology.  -cont  Vimpat and keppra.  - Neuro is following and assisting with management    Hyperkalemia - improved with IVF    Dilantin toxicity - level  only mildly improved. chekcing levels.     H/o left MCA CVA - with thrombus in atrium on prior admission - Eliquis is being changed to lovenox till we get the dilantin out of her system.   H/o PE last admission - will start her on lovenox till we get the dilantin out of her system, because of the interaction and contraindication and then restart her on eliquis.     Dysphagia - resume tube feeds  Left pleural effusion: s/p thoracocentesis and fluid sent for analysis.  Resume antibiotics and repeat CXR shows marked reduction in the pleural fluid.   Loose stools: c diff PCR negative. Possibly secondary to antibiotics. Plan to stop antibiotics in am.       Code Status: DNR Family Communication: none Disposition Plan: return to SNF- possibly Monday.  DVT prophylaxis: Eliquis  Consultants: Neuro   Procedures: [place] THORACOCENTESIS.   Antibiotics: Anti-infectives   Start     Dose/Rate Route Frequency Ordered Stop   05/16/14 2030  vancomycin (VANCOCIN) IVPB 750 mg/150 ml premix     750 mg 150 mL/hr over 60 Minutes Intravenous Every 12 hours 05/16/14 2016     05/13/14 2000  vancomycin (VANCOCIN) 500 mg in sodium chloride 0.9 % 100 mL IVPB  Status:  Discontinued     500 mg 100 mL/hr over 60 Minutes Intravenous Every 12 hours 05/13/14 1943 05/16/14 2016   05/13/14 2000  ceFEPIme (MAXIPIME) 1 g in dextrose 5 % 50 mL IVPB     1 g 100 mL/hr over 30 Minutes Intravenous Every 24 hours 05/13/14 1943  Objective: Filed Weights   05/16/14 0422 05/17/14 0252 05/18/14 0443  Weight: 51.9 kg (114 lb 6.7 oz) 53.7 kg (118 lb 6.2 oz) 55.2 kg (121 lb 11.1 oz)    Intake/Output Summary (Last 24 hours) at 05/18/14 1917 Last data filed at 05/18/14 1800  Gross per 24 hour  Intake   4400 ml  Output      0 ml  Net   4400 ml     Vitals Filed Vitals:   05/18/14 0740 05/18/14 1236 05/18/14 1550 05/18/14 1613  BP: 142/69  163/74   Pulse: 112  119   Temp:  98.7 F (37.1 C)  98.9  F (37.2 C)  TempSrc:  Axillary  Axillary  Resp: 20  25   Height:      Weight:      SpO2: 98%  100%     Exam: General: No acute respiratory distress- minimally responsive- confused Lungs: Clear to auscultation bilaterally without wheezes or crackles Cardiovascular: Regular rate and rhythm without murmur gallop or rub normal S1 and S2 Abdomen: Nontender, nondistended, soft, bowel sounds positive, no rebound, no ascites, no appreciable mass Extremities: No significant cyanosis, clubbing, or edema bilateral lower extremities  Data Reviewed: Basic Metabolic Panel:  Recent Labs Lab 05/13/14 1339 05/14/14 0840 05/15/14 0258 05/16/14 0340 05/18/14 0340  NA 124* 130* 130* 133* 138  K 5.5* 5.0 4.5 4.4 3.4*  CL 85* 92* 96 95* 104  CO2 30 27 25 26 24   GLUCOSE 115* 114* 103* 110* 100*  BUN 22 14 14 11 9   CREATININE 0.59 0.39* 0.43* 0.37* 0.27*  CALCIUM 8.6 8.6 7.9* 8.4 6.7*   Liver Function Tests:  Recent Labs Lab 05/13/14 1339 05/14/14 0840 05/18/14 1630  AST 23 20  --   ALT 11 10  --   ALKPHOS 377* 365*  --   BILITOT 0.3 0.3  --   PROT 6.8 6.9  --   ALBUMIN 2.8* 3.0* 2.2*   No results found for this basename: LIPASE, AMYLASE,  in the last 168 hours No results found for this basename: AMMONIA,  in the last 168 hours CBC:  Recent Labs Lab 05/13/14 1339 05/14/14 0840 05/15/14 0258 05/15/14 1133 05/16/14 0340 05/18/14 0340  WBC 9.9 9.3 7.6  --  10.8* 8.7  NEUTROABS 5.7  --   --   --   --   --   HGB 11.6* 11.6* 9.5* 10.5* 10.7* 9.5*  HCT 34.1* 33.5* 27.9* 31.5* 31.9* 27.6*  MCV 83.8 83.8 84.8  --  85.3 87.9  PLT 436* 372 335  --  365 258   Cardiac Enzymes: No results found for this basename: CKTOTAL, CKMB, CKMBINDEX, TROPONINI,  in the last 168 hours BNP (last 3 results) No results found for this basename: PROBNP,  in the last 8760 hours CBG:  Recent Labs Lab 05/17/14 2308 05/18/14 0413 05/18/14 0738 05/18/14 1210 05/18/14 1531  GLUCAP 133* 123* 124*  141* 96    Recent Results (from the past 240 hour(s))  CULTURE, BLOOD (ROUTINE X 2)     Status: None   Collection Time    05/13/14  9:14 PM      Result Value Ref Range Status   Specimen Description BLOOD RIGHT HAND   Final   Special Requests BOTTLES DRAWN AEROBIC ONLY 4CC   Final   Culture  Setup Time     Final   Value: 05/14/2014 00:29     Performed at [facility]  Final   Value:        BLOOD CULTURE RECEIVED NO GROWTH TO DATE CULTURE WILL BE HELD FOR 5 DAYS BEFORE ISSUING A FINAL NEGATIVE REPORT     Performed at [facility]   Report Status PENDING   Incomplete  CULTURE, BLOOD (ROUTINE X 2)     Status: None   Collection Time    05/13/14  9:27 PM      Result Value Ref Range Status   Specimen Description BLOOD LEFT ARM   Final   Special Requests BOTTLES DRAWN AEROBIC ONLY 3CC   Final   Culture  Setup Time     Final   Value: 05/14/2014 00:30     Performed at [facility]   Culture     Final   Value:        BLOOD CULTURE RECEIVED NO GROWTH TO DATE CULTURE WILL BE HELD FOR 5 DAYS BEFORE ISSUING A FINAL NEGATIVE REPORT     Performed at [facility]   Report Status PENDING   Incomplete  MRSA PCR SCREENING     Status: None   Collection Time    05/13/14 10:05 PM      Result Value Ref Range Status   MRSA by PCR NEGATIVE  NEGATIVE Final   Comment:            The GeneXpert MRSA Assay (FDA     approved for NASAL specimens     only), is one component of a     comprehensive MRSA colonization     surveillance program. It is not     intended to diagnose MRSA     infection nor to guide or     monitor treatment for     MRSA infections.  GRAM STAIN     Status: None   Collection Time    05/17/14  9:42 AM      Result Value Ref Range Status   Specimen Description PLEURAL FLUID LEFT   Final   Special Requests 60ML FLUID   Final   Gram Stain     Final   Value: CYTOSPIN PREP     WBC PRESENT,BOTH PMN AND MONONUCLEAR     NO ORGANISMS SEEN    Report Status 05/17/2014 FINAL   Final  BODY FLUID CULTURE     Status: None   Collection Time    05/17/14  9:42 AM      Result Value Ref Range Status   Specimen Description PLEURAL FLUID LEFT   Final   Special Requests 60ML FLUID   Final   Gram Stain     Final   Value: CYTOSPIN WBC PRESENT,BOTH PMN AND MONONUCLEAR     NO ORGANISMS SEEN     Performed at [facility]     Performed at [facility]   Culture     Final   Value: NO GROWTH 1 DAY     Performed at [facility]   Report Status PENDING   Incomplete  CLOSTRIDIUM DIFFICILE BY PCR     Status: None   Collection Time    05/18/14 11:25 AM      Result Value Ref Range Status   C difficile by pcr NEGATIVE  NEGATIVE Final     Studies:  Recent x-ray studies have been reviewed in detail by the Attending Physician  Scheduled Meds:  Scheduled Meds: . ceFEPime (MAXIPIME) IV  1 g Intravenous Q24H  . enoxaparin (LOVENOX) injection  50  mg Subcutaneous Q12H  . famotidine  20 mg Per Tube BID  . feeding supplement (PRO-STAT SUGAR FREE 64)  30 mL Per Tube Q1500  . free water  150 mL Per Tube 4 times per day  . lacosamide  150 mg Per Tube BID  . levETIRAcetam  500 mg Oral BID  . metoprolol tartrate  25 mg Per Tube BID  . potassium chloride  40 mEq Per Tube BID  . vancomycin  750 mg Intravenous Q12H   Continuous Infusions: . sodium chloride 100 mL/hr at 05/18/14 1227  . feeding supplement (JEVITY 1.2 CAL) 1,000 mL (05/18/14 0443)    Time spent on care of this patient: >35 min   Andren Bethea, MD 05/18/2014, 7:17 PM  LOS: 5 days   [facility] Office  [phone] Pager - Text Page per [email or web address]  If 7PM-7AM, please contact night-coverage [email or web address]

## 2014-05-19 DIAGNOSIS — T420X1A Poisoning by hydantoin derivatives, accidental (unintentional), initial encounter: Secondary | ICD-10-CM

## 2014-05-19 DIAGNOSIS — R569 Unspecified convulsions: Secondary | ICD-10-CM

## 2014-05-19 DIAGNOSIS — J189 Pneumonia, unspecified organism: Principal | ICD-10-CM

## 2014-05-19 LAB — GLUCOSE, CAPILLARY
Glucose-Capillary: 116 mg/dL — ABNORMAL HIGH (ref 70–99)
Glucose-Capillary: 121 mg/dL — ABNORMAL HIGH (ref 70–99)
Glucose-Capillary: 128 mg/dL — ABNORMAL HIGH (ref 70–99)
Glucose-Capillary: 128 mg/dL — ABNORMAL HIGH (ref 70–99)
Glucose-Capillary: 132 mg/dL — ABNORMAL HIGH (ref 70–99)
Glucose-Capillary: 139 mg/dL — ABNORMAL HIGH (ref 70–99)

## 2014-05-19 LAB — COMPREHENSIVE METABOLIC PANEL
ALBUMIN: 2.1 g/dL — AB (ref 3.5–5.2)
ALK PHOS: 307 U/L — AB (ref 39–117)
ALT: 14 U/L (ref 0–35)
AST: 35 U/L (ref 0–37)
Anion gap: 7 (ref 5–15)
BILIRUBIN TOTAL: 0.4 mg/dL (ref 0.3–1.2)
BUN: 12 mg/dL (ref 6–23)
CHLORIDE: 97 meq/L (ref 96–112)
CO2: 27 meq/L (ref 19–32)
Calcium: 8.5 mg/dL (ref 8.4–10.5)
Creatinine, Ser: 0.38 mg/dL — ABNORMAL LOW (ref 0.50–1.10)
GFR calc Af Amer: >90 mL/min (ref >90)
Glucose, Bld: 116 mg/dL — ABNORMAL HIGH (ref 70–99)
POTASSIUM: 4.7 meq/L (ref 3.7–5.3)
SODIUM: 131 meq/L — AB (ref 137–147)
Total Protein: 5.8 g/dL — ABNORMAL LOW (ref 6.0–8.3)

## 2014-05-19 MED ORDER — METOPROLOL TARTRATE 25 MG PO TABS
37.5000 mg | ORAL_TABLET | Freq: Two times a day (BID) | ORAL | Status: DC
  Administered 2014-05-19 – 2014-05-20 (×2): 37.5 mg
  Filled 2014-05-19 (×3): qty 1

## 2014-05-19 NOTE — Progress Notes (Signed)
Subjective: Continues to improve  Exam: Filed Vitals:   05/19/14 1100  BP:   Pulse:   Temp: 97.5 F (36.4 C)  Resp:    Gen: In bed, NAD MS: Able to answer multiple questions today. Answers some inappropriately(e.g states "They went to church I think " when asked "Where are you?" telling me where her family went instead.  CN: Pupils equal round and reactive, right facial droop. She is able to cross midline both directions with extraocular movements Motor: Has a mild right hemiparesis. Able to lift both legs out of bed.  Dtr: 2+ and symmetric  Impression: 78 year old female with a history of stroke causing right hemiparesis and aphasia who presented with a total of 2 lifetime seizures, the second of which was status epilepticus. She was dilantin toxic on admission which I feel fully explains her mental status and slow improvement. She will need to continue keppra and vimpat.    Recommendations: 1) Continue vimpat 150mg  BID 2) continue keppra 500mg  BID 3) Neurology will sign off at this time. Please call with further questions.   Roland Rack, MD [facility] [phone]  If 7pm- 7am, please page neurology on call as listed in [place].

## 2014-05-19 NOTE — Progress Notes (Addendum)
ANTIBIOTIC/ANTICOAGULATION CONSULT NOTE - FOLLOW UP  Pharmacy Consult for vancomycin/cefepime/lovenox Indication: pneumonia/history of PE  Allergies  Allergen Reactions  . Aspirin Other (See Comments)    REACTION: unknown  . Codeine Other (See Comments)    REACTION: unknown  . Hctz [Hydrochlorothiazide] Other (See Comments)    REACTION: unknown  . Hydralazine Other (See Comments)    REACTION: unknown  . Lipitor [Atorvastatin] Other (See Comments)    REACTION: unknown  . Reserpine Other (See Comments)    REACTION: unknown  . Tramadol Other (See Comments)    REACTION: unknown    Patient Measurements: Height: 5\' 5"  (165.1 cm) Weight: 117 lb 4.6 oz (53.2 kg) IBW/kg (Calculated) : 57  Vital Signs: Temp: 98.9 F (37.2 C) (10/04 0700) Temp Source: Oral (10/04 0700) BP: 149/77 mmHg (10/04 0710) Pulse Rate: 118 (10/04 0710) Intake/Output from previous day: 10/03 0701 - 10/04 0700 In: 3320 [I.V.:1200; NG/GT:1820; IV Piggyback:300] Out: -  Intake/Output from this shift:    Labs:  Recent Labs  05/18/14 0340  WBC 8.7  HGB 9.5*  PLT 258  CREATININE 0.27*   Estimated Creatinine Clearance: 50.2 ml/min (by C-G formula based on Cr of 0.27).  Recent Labs  05/16/14 0950 05/16/14 1920  VANCOTROUGH 21.3* 11.4     Microbiology: Recent Results (from the past 720 hour(s))  CULTURE, BLOOD (ROUTINE X 2)     Status: None   Collection Time    05/13/14  9:14 PM      Result Value Ref Range Status   Specimen Description BLOOD RIGHT HAND   Final   Special Requests BOTTLES DRAWN AEROBIC ONLY 4CC   Final   Culture  Setup Time     Final   Value: 05/14/2014 00:29     Performed at [facility]   Culture     Final   Value:        BLOOD CULTURE RECEIVED NO GROWTH TO DATE CULTURE WILL BE HELD FOR 5 DAYS BEFORE ISSUING A FINAL NEGATIVE REPORT     Performed at [facility]   Report Status PENDING   Incomplete  CULTURE, BLOOD (ROUTINE X 2)     Status: None   Collection  Time    05/13/14  9:27 PM      Result Value Ref Range Status   Specimen Description BLOOD LEFT ARM   Final   Special Requests BOTTLES DRAWN AEROBIC ONLY 3CC   Final   Culture  Setup Time     Final   Value: 05/14/2014 00:30     Performed at [facility]   Culture     Final   Value:        BLOOD CULTURE RECEIVED NO GROWTH TO DATE CULTURE WILL BE HELD FOR 5 DAYS BEFORE ISSUING A FINAL NEGATIVE REPORT     Performed at [facility]   Report Status PENDING   Incomplete  MRSA PCR SCREENING     Status: None   Collection Time    05/13/14 10:05 PM      Result Value Ref Range Status   MRSA by PCR NEGATIVE  NEGATIVE Final   Comment:            The GeneXpert MRSA Assay (FDA     approved for NASAL specimens     only), is one component of a     comprehensive MRSA colonization     surveillance program. It is not     intended to diagnose MRSA  infection nor to guide or     monitor treatment for     MRSA infections.  GRAM STAIN     Status: None   Collection Time    05/17/14  9:42 AM      Result Value Ref Range Status   Specimen Description PLEURAL FLUID LEFT   Final   Special Requests 60ML FLUID   Final   Gram Stain     Final   Value: CYTOSPIN PREP     WBC PRESENT,BOTH PMN AND MONONUCLEAR     NO ORGANISMS SEEN   Report Status 05/17/2014 FINAL   Final  BODY FLUID CULTURE     Status: None   Collection Time    05/17/14  9:42 AM      Result Value Ref Range Status   Specimen Description PLEURAL FLUID LEFT   Final   Special Requests 60ML FLUID   Final   Gram Stain     Final   Value: CYTOSPIN WBC PRESENT,BOTH PMN AND MONONUCLEAR     NO ORGANISMS SEEN     Performed at [facility]     Performed at [facility]   Culture     Final   Value: NO GROWTH 1 DAY     Performed at [facility]   Report Status PENDING   Incomplete  CLOSTRIDIUM DIFFICILE BY PCR     Status: None   Collection Time    05/18/14 11:25 AM      Result Value Ref Range Status   C  difficile by pcr NEGATIVE  NEGATIVE Final    Anti-infectives   Start     Dose/Rate Route Frequency Ordered Stop   05/16/14 2030  vancomycin (VANCOCIN) IVPB 750 mg/150 ml premix     750 mg 150 mL/hr over 60 Minutes Intravenous Every 12 hours 05/16/14 2016     05/13/14 2000  vancomycin (VANCOCIN) 500 mg in sodium chloride 0.9 % 100 mL IVPB  Status:  Discontinued     500 mg 100 mL/hr over 60 Minutes Intravenous Every 12 hours 05/13/14 1943 05/16/14 2016   05/13/14 2000  ceFEPIme (MAXIPIME) 1 g in dextrose 5 % 50 mL IVPB     1 g 100 mL/hr over 30 Minutes Intravenous Every 24 hours 05/13/14 1943        Assessment: 78 yo f admitted on 9/28 for confusion.  Patient is on apixaban for history of PE PTA.  She was switched to full dose lovenox until dilantin clears out of her system, then she will be put back on her apixaban.  Dilantin level ordered for tomorrow AM. Hgb 9.5, plts 258, no s/s of bleeding.  Patient is also on day 7 of vancomycin/cefepime for pneumonia. Per MD note, antibiotics will be stopped after 7 days.  No growth on any cultures yet, wbc 8.7, tmax 98.9, SCr 0.27, CrCl ~50 ml/min.  Continue vancomycin and cefepime for one more day.   Vancomycin 9/28 >> - 10/1 Vanco level 21.3 >> (dose give before level) - 10/1 PM Vanco trough 11.4 >> increased to 750 mg q 12hr Cefepime 9/28 >>  9/28: BC x 2>> ngtd 10/2 c diff (-) 10/2 left pleural fluid: pending  Goal of Therapy:  Vancomycin trough level 15-20 mcg/ml  Plan:  Continue lovenox 50 mg sq q12hr (1 mg/kg q12hr) F/u AM dilantin level tomorrow  Continue vancomycin 750 mg IV q12hr Continue cefepime 1gm IV q24hr F/u tomorrow for stop dates Monitor CBC, fever curve, cultures, clinical  course of patient  Howard Patton L. Nicole Kindred, PharmD Clinical Pharmacy Resident Pager: [phone] 05/19/2014 8:38 AM  Vancomycin and cefepime discontinued today.  Rondarius Kadrmas L. Nicole Kindred, PharmD Clinical Pharmacy Resident Pager: [phone] 05/19/2014 10:52  AM

## 2014-05-19 NOTE — Progress Notes (Signed)
[facility] Progress Note   Tanya White ZDG:[id number] DOB: 06/04/1936 DOA: 05/13/2014 PCP: [name], MD  Brief narrative: Tanya White is a 78 y.o. female with known history of seizure disorder on multiple seizure medications, history of left MCA CVA, dysphagia with PEG tube was recently admitted at [facility], discharged on 9/14 when she was admitted with status epilepticus. During the hospitalization, patient's seizure medications were uptitrated. She was found to have dysphagia and failed swallow screen- PEG tube was placed on 9/4. CTA chest on 9/3 showed a 2 small right lower lobe pulmonary embolus, echocardiogram showed small thrombus in the right atrium- patient was placed on Eliquis, which was later changed to lovenox as pt's dilantin level is still therapeutic.   Subjective: Sleeping comfortably, persistent loose bm's 3 yesterday. c diff pcr is negative.   Assessment/Plan: Principal Problem:   Acute encephalopathy - multifactorial - hyponatremia- resolved. - Dilantin toxicity- holding Dilantin - unwitnessed seizure?  - due to underlying pneumonia?  - much improved when compared to the state on admission  Active Problems:   Hyponatremia - resolved with adequate NS hydration.     Left lower lobe pneumonia? - left lower lobe consolidation with left pl effusion - she was started on broad spectrum antibiotics, day 7 of total antibiotics. Repeat CXR shows interval increase in the left pleural effusion. No consolidation.- Influenza panel negative  Right lung base nodule on CXR - outpatient follow up with CT chest to follow up on the right lung base nodule.     Seizure disorder - hold Dilantin - Depakote (level was < 10) - patient's Depakote was held at the nursing home due to a mental status change- it was initially started and discontinued on 10/2 by neurology.  -cont  Vimpat and keppra.  - Neuro is following and assisting with management   Hyperkalemia - improved with IVF    Dilantin toxicity - level only mildly improved. chekcing levels.     H/o left MCA CVA - with thrombus in atrium on prior admission - Eliquis is being changed to lovenox till we get the dilantin out of her system.   H/o PE last admission - will start her on lovenox till we get the dilantin out of her system, because of the interaction and contraindication and then restart her on eliquis.     Dysphagia - resume tube feeds  Left pleural effusion: s/p thoracocentesis and fluid sent for analysis.  Resume antibiotics and repeat CXR shows marked reduction in the pleural fluid.   Loose stools: c diff PCR negative. Possibly secondary to antibiotics. Plan to stop antibiotics in am.       Code Status: DNR Family Communication: none Disposition Plan: return to SNF- possibly Monday.  DVT prophylaxis: Eliquis  Consultants: Neuro   Procedures: [place] THORACOCENTESIS.   Antibiotics: Anti-infectives   Start     Dose/Rate Route Frequency Ordered Stop   05/16/14 2030  vancomycin (VANCOCIN) IVPB 750 mg/150 ml premix  Status:  Discontinued     750 mg 150 mL/hr over 60 Minutes Intravenous Every 12 hours 05/16/14 2016 05/19/14 1034   05/13/14 2000  vancomycin (VANCOCIN) 500 mg in sodium chloride 0.9 % 100 mL IVPB  Status:  Discontinued     500 mg 100 mL/hr over 60 Minutes Intravenous Every 12 hours 05/13/14 1943 05/16/14 2016   05/13/14 2000  ceFEPIme (MAXIPIME) 1 g in dextrose 5 % 50 mL IVPB  Status:  Discontinued     1 g 100 mL/hr over  30 Minutes Intravenous Every 24 hours 05/13/14 1943 05/19/14 1034         Objective: Filed Weights   05/17/14 0252 05/18/14 0443 05/19/14 0334  Weight: 53.7 kg (118 lb 6.2 oz) 55.2 kg (121 lb 11.1 oz) 53.2 kg (117 lb 4.6 oz)    Intake/Output Summary (Last 24 hours) at 05/19/14 1034 Last data filed at 05/19/14 1032  Gross per 24 hour  Intake   2735 ml  Output      0 ml  Net   2735 ml     Vitals Filed  Vitals:   05/19/14 0026 05/19/14 0334 05/19/14 0700 05/19/14 0710  BP: 118/80 141/78  149/77  Pulse: 114 119  118  Temp: 98.3 F (36.8 C) 98.1 F (36.7 C) 98.9 F (37.2 C)   TempSrc: Axillary Axillary Oral   Resp: 28 24  22   Height:      Weight:  53.2 kg (117 lb 4.6 oz)    SpO2: 98% 96%  98%    Exam: General: No acute respiratory distress- minimally responsive- confused Lungs: Clear to auscultation bilaterally without wheezes or crackles Cardiovascular: Regular rate and rhythm without murmur gallop or rub normal S1 and S2 Abdomen: Nontender, nondistended, soft, bowel sounds positive, no rebound, no ascites, no appreciable mass Extremities: No significant cyanosis, clubbing, or edema bilateral lower extremities  Data Reviewed: Basic Metabolic Panel:  Recent Labs Lab 05/13/14 1339 05/14/14 0840 05/15/14 0258 05/16/14 0340 05/18/14 0340  NA 124* 130* 130* 133* 138  K 5.5* 5.0 4.5 4.4 3.4*  CL 85* 92* 96 95* 104  CO2 30 27 25 26 24   GLUCOSE 115* 114* 103* 110* 100*  BUN 22 14 14 11 9   CREATININE 0.59 0.39* 0.43* 0.37* 0.27*  CALCIUM 8.6 8.6 7.9* 8.4 6.7*   Liver Function Tests:  Recent Labs Lab 05/13/14 1339 05/14/14 0840 05/18/14 1630  AST 23 20  --   ALT 11 10  --   ALKPHOS 377* 365*  --   BILITOT 0.3 0.3  --   PROT 6.8 6.9  --   ALBUMIN 2.8* 3.0* 2.2*   No results found for this basename: LIPASE, AMYLASE,  in the last 168 hours No results found for this basename: AMMONIA,  in the last 168 hours CBC:  Recent Labs Lab 05/13/14 1339 05/14/14 0840 05/15/14 0258 05/15/14 1133 05/16/14 0340 05/18/14 0340  WBC 9.9 9.3 7.6  --  10.8* 8.7  NEUTROABS 5.7  --   --   --   --   --   HGB 11.6* 11.6* 9.5* 10.5* 10.7* 9.5*  HCT 34.1* 33.5* 27.9* 31.5* 31.9* 27.6*  MCV 83.8 83.8 84.8  --  85.3 87.9  PLT 436* 372 335  --  365 258   Cardiac Enzymes: No results found for this basename: CKTOTAL, CKMB, CKMBINDEX, TROPONINI,  in the last 168 hours BNP (last 3  results) No results found for this basename: PROBNP,  in the last 8760 hours CBG:  Recent Labs Lab 05/18/14 1531 05/18/14 1940 05/19/14 0025 05/19/14 0333 05/19/14 0725  GLUCAP 96 131* 139* 116* 121*    Recent Results (from the past 240 hour(s))  CULTURE, BLOOD (ROUTINE X 2)     Status: None   Collection Time    05/13/14  9:14 PM      Result Value Ref Range Status   Specimen Description BLOOD RIGHT HAND   Final   Special Requests BOTTLES DRAWN AEROBIC ONLY 4CC   Final   Culture  Setup Time     Final   Value: 05/14/2014 00:29     Performed at [facility]   Culture     Final   Value:        BLOOD CULTURE RECEIVED NO GROWTH TO DATE CULTURE WILL BE HELD FOR 5 DAYS BEFORE ISSUING A FINAL NEGATIVE REPORT     Performed at [facility]   Report Status PENDING   Incomplete  CULTURE, BLOOD (ROUTINE X 2)     Status: None   Collection Time    05/13/14  9:27 PM      Result Value Ref Range Status   Specimen Description BLOOD LEFT ARM   Final   Special Requests BOTTLES DRAWN AEROBIC ONLY 3CC   Final   Culture  Setup Time     Final   Value: 05/14/2014 00:30     Performed at [facility]   Culture     Final   Value:        BLOOD CULTURE RECEIVED NO GROWTH TO DATE CULTURE WILL BE HELD FOR 5 DAYS BEFORE ISSUING A FINAL NEGATIVE REPORT     Performed at [facility]   Report Status PENDING   Incomplete  MRSA PCR SCREENING     Status: None   Collection Time    05/13/14 10:05 PM      Result Value Ref Range Status   MRSA by PCR NEGATIVE  NEGATIVE Final   Comment:            The GeneXpert MRSA Assay (FDA     approved for NASAL specimens     only), is one component of a     comprehensive MRSA colonization     surveillance program. It is not     intended to diagnose MRSA     infection nor to guide or     monitor treatment for     MRSA infections.  GRAM STAIN     Status: None   Collection Time    05/17/14  9:42 AM      Result Value Ref Range Status    Specimen Description PLEURAL FLUID LEFT   Final   Special Requests 60ML FLUID   Final   Gram Stain     Final   Value: CYTOSPIN PREP     WBC PRESENT,BOTH PMN AND MONONUCLEAR     NO ORGANISMS SEEN   Report Status 05/17/2014 FINAL   Final  BODY FLUID CULTURE     Status: None   Collection Time    05/17/14  9:42 AM      Result Value Ref Range Status   Specimen Description PLEURAL FLUID LEFT   Final   Special Requests 60ML FLUID   Final   Gram Stain     Final   Value: CYTOSPIN WBC PRESENT,BOTH PMN AND MONONUCLEAR     NO ORGANISMS SEEN     Performed at [facility]     Performed at [facility]   Culture     Final   Value: NO GROWTH 1 DAY     Performed at [facility]   Report Status PENDING   Incomplete  CLOSTRIDIUM DIFFICILE BY PCR     Status: None   Collection Time    05/18/14 11:25 AM      Result Value Ref Range Status   C difficile by pcr NEGATIVE  NEGATIVE Final     Studies:  Recent x-ray studies have been reviewed  in detail by the Attending Physician  Scheduled Meds:  Scheduled Meds: . enoxaparin (LOVENOX) injection  50 mg Subcutaneous Q12H  . famotidine  20 mg Per Tube BID  . feeding supplement (PRO-STAT SUGAR FREE 64)  30 mL Per Tube Q1500  . free water  150 mL Per Tube 4 times per day  . lacosamide  150 mg Per Tube BID  . levETIRAcetam  500 mg Oral BID  . metoprolol tartrate  37.5 mg Per Tube BID   Continuous Infusions: . feeding supplement (JEVITY 1.2 CAL) 1,000 mL (05/19/14 1000)    Time spent on care of this patient: >35 min   Betzalel Umbarger, MD 05/19/2014, 10:34 AM  LOS: 6 days   [facility] Office  [phone] Pager - Text Page per [email or web address]  If 7PM-7AM, please contact night-coverage [email or web address]

## 2014-05-20 LAB — COMPREHENSIVE METABOLIC PANEL
ALK PHOS: 337 U/L — AB (ref 39–117)
ALT: 16 U/L (ref 0–35)
AST: 37 U/L (ref 0–37)
Albumin: 2.1 g/dL — ABNORMAL LOW (ref 3.5–5.2)
Anion gap: 8 (ref 5–15)
BUN: 14 mg/dL (ref 6–23)
CO2: 28 meq/L (ref 19–32)
Calcium: 8.8 mg/dL (ref 8.4–10.5)
Chloride: 96 mEq/L (ref 96–112)
Creatinine, Ser: 0.4 mg/dL — ABNORMAL LOW (ref 0.50–1.10)
GFR calc Af Amer: >90 mL/min (ref >90)
GLUCOSE: 119 mg/dL — AB (ref 70–99)
POTASSIUM: 4.7 meq/L (ref 3.7–5.3)
SODIUM: 132 meq/L — AB (ref 137–147)
Total Bilirubin: 0.3 mg/dL (ref 0.3–1.2)
Total Protein: 6.1 g/dL (ref 6.0–8.3)

## 2014-05-20 LAB — GLUCOSE, CAPILLARY
GLUCOSE-CAPILLARY: 145 mg/dL — AB (ref 70–99)
Glucose-Capillary: 126 mg/dL — ABNORMAL HIGH (ref 70–99)
Glucose-Capillary: 138 mg/dL — ABNORMAL HIGH (ref 70–99)
Glucose-Capillary: 149 mg/dL — ABNORMAL HIGH (ref 70–99)

## 2014-05-20 LAB — CULTURE, BLOOD (ROUTINE X 2)
CULTURE: NO GROWTH
Culture: NO GROWTH

## 2014-05-20 LAB — BODY FLUID CULTURE: Culture: NO GROWTH

## 2014-05-20 LAB — PHENYTOIN LEVEL, TOTAL: Phenytoin Lvl: <2.5 ug/mL — ABNORMAL LOW (ref 10.0–20.0)

## 2014-05-20 MED ORDER — APIXABAN 5 MG PO TABS
5.0000 mg | ORAL_TABLET | Freq: Two times a day (BID) | ORAL | Status: DC
  Filled 2014-05-20 (×2): qty 1

## 2014-05-20 MED ORDER — JEVITY 1.2 CAL PO LIQD: 1000.0000 mL | ORAL | Status: DC

## 2014-05-20 MED ORDER — LORAZEPAM 0.5 MG PO TABS: 0.5000 mg | ORAL_TABLET | Freq: Four times a day (QID) | ORAL | Status: DC | PRN

## 2014-05-20 MED ORDER — APIXABAN 5 MG PO TABS
5.0000 mg | ORAL_TABLET | Freq: Two times a day (BID) | ORAL | Status: DC
  Filled 2014-05-20: qty 1

## 2014-05-20 MED ORDER — METOPROLOL TARTRATE 12.5 MG HALF TABLET: 37.5000 mg | ORAL_TABLET | Freq: Two times a day (BID) | ORAL | Status: DC

## 2014-05-20 MED ORDER — LEVETIRACETAM 500 MG PO TABS: 500.0000 mg | ORAL_TABLET | Freq: Two times a day (BID) | ORAL | Status: DC

## 2014-05-20 MED ORDER — FREE WATER: 150.0000 mL | Freq: Four times a day (QID) | Status: DC

## 2014-05-20 MED ORDER — PRO-STAT SUGAR FREE PO LIQD: 30.0000 mL | Freq: Every day | ORAL | Status: DC

## 2014-05-20 NOTE — Progress Notes (Signed)
Present during session, agree with POC.  Alben Deeds, [place] DPT  [phone]

## 2014-05-20 NOTE — Progress Notes (Addendum)
ANTICOAGULATION CONSULT NOTE - Follow Up Consult  Pharmacy Consult for apixiban Indication: history of PE  Allergies  Allergen Reactions  . Aspirin Other (See Comments)    REACTION: unknown  . Codeine Other (See Comments)    REACTION: unknown  . Hctz [Hydrochlorothiazide] Other (See Comments)    REACTION: unknown  . Hydralazine Other (See Comments)    REACTION: unknown  . Lipitor [Atorvastatin] Other (See Comments)    REACTION: unknown  . Reserpine Other (See Comments)    REACTION: unknown  . Tramadol Other (See Comments)    REACTION: unknown    Patient Measurements: Height: 5\' 5"  (165.1 cm) Weight: 115 lb 11.9 oz (52.5 kg) IBW/kg (Calculated) : 57   Vital Signs: Temp: 98.2 F (36.8 C) (10/05 1100) Temp Source: Oral (10/05 1100) BP: 104/71 mmHg (10/05 0739) Pulse Rate: 115 (10/05 0739)  Labs:  Recent Labs  05/18/14 0340 05/19/14 1040 05/20/14 0306  HGB 9.5*  --   --   HCT 27.6*  --   --   PLT 258  --   --   CREATININE 0.27* 0.38* 0.40*    Estimated Creatinine Clearance: 49.6 ml/min (by C-G formula based on Cr of 0.4).   Medications:  Scheduled:  . apixaban  5 mg Oral BID  . famotidine  20 mg Per Tube BID  . feeding supplement (PRO-STAT SUGAR FREE 64)  30 mL Per Tube Q1500  . free water  150 mL Per Tube 4 times per day  . lacosamide  150 mg Per Tube BID  . levETIRAcetam  500 mg Oral BID  . metoprolol tartrate  37.5 mg Per Tube BID    Assessment: 78 yo female here with confusion and history of recent seizures now on vimpat and keppra. Patient with history of PE on apixiban at home and this has been on hold due to supratherapeutic phenytoin levels  (phenytoin can decrease apixiban levels and concurrent therapy is not recommended).  Phenytoin level is < 2.5 and patient to resume apixiban today; last dose of lovenox was 9:30am today  Goal of Therapy:  Monitor platelets by anticoagulation protocol: Yes   Plan:  -Resume home dose of apixiban 5mg  po bid  (start at 10pm) -Will follow patient progress  Hildred Laser, Pharm D 05/20/2014 12:06 PM   e

## 2014-05-20 NOTE — Discharge Summary (Addendum)
Physician Discharge Summary  Tanya White JOA:[id number] DOB: 1936-04-02 DOA: 05/13/2014  PCP: [name], MD  Admit date: 05/13/2014 Discharge date: 05/20/2014  Time spent: 30 minutes  Recommendations for Outpatient Follow-up:  1. Follow up with CT chest in 4 weeks to follow up with right lung base nodule 2. Follow upwith PCP in 1 week.  3. Follow up with BMP in one week to check electrolytes.   Discharge Diagnoses:  Principal Problem:   Acute encephalopathy Active Problems:   Hyponatremia   Seizure   Hyperkalemia   Dilantin toxicity   CVA (cerebral infarction)   Dysphagia   Left lower lobe pneumonia   Discharge Condition: improved.   Diet recommendation: regular diet.   Filed Weights   05/18/14 0443 05/19/14 0334 05/20/14 0500  Weight: 55.2 kg (121 lb 11.1 oz) 53.2 kg (117 lb 4.6 oz) 52.5 kg (115 lb 11.9 oz)    History of present illness:  Tanya White is a 78 y.o. female with known history of seizure disorder on multiple seizure medications, history of left MCA CVA, dysphagia with PEG tube was recently admitted at [facility], discharged on 9/14 when she was admitted with status epilepticus. During the hospitalization, patient's seizure medications were uptitrated. She was found to have dysphagia and failed swallow screen- PEG tube was placed on 9/4. CTA chest on 9/3 showed a 2 small right lower lobe pulmonary embolus, echocardiogram showed small thrombus in the right atrium- patient was placed on Eliquis, . His dilantin and depakote were discontinued and she was put on keppra and vimpat. She is more alert and talkative. She has completed the course of antibiotics for possible HCAP. She also had some loose stools, and the c diff pcr was negative.    Hospital Course:   Acute encephalopathy  - multifactorial  - hyponatremia- improved - Dilantin toxicity- holding Dilantin  - unwitnessed seizure?  - due to underlying pneumonia?  - much improved when  compared to the state on admission   Hyponatremia  Improved.  Left lower lobe pneumonia?  - left lower lobe consolidation with left pl effusion  - she was started on broad spectrum antibiotics, day 7 of total antibiotics. Repeat CXR shows interval increase in the left pleural effusion. No consolidation.- Influenza panel negative  Right lung base nodule on CXR  - outpatient follow up with CT chest to follow up on the right lung base nodule.  Seizure disorder  - hold Dilantin  - Depakote (level was < 10) - patient's Depakote was held at the nursing home due to a mental status change- it was initially started and discontinued on 10/2 by neurology.  -cont Vimpat and keppra.  - Neuro is following and assisting with management , appreciate their recommendations.  Hyperkalemia  - improved with IVF  Dilantin toxicity  Levels minimal.  H/o left MCA CVA  - with thrombus in atrium on prior admission  Resume Eliquis.  H/o PE last admission  Resume eliquis. Dysphagia  - resume tube feeds  Left pleural effusion: s/p thoracocentesis and fluid sent for analysis.   repeat CXR shows marked reduction in the pleural fluid.  Loose stools:  c diff PCR negative. Possibly secondary to antibiotics. We stopped the antibiotics and the diarrhea improved.     Procedures:  Echocardiogram.   Consultations:  neurology  Discharge Exam: Filed Vitals:   05/20/14 1100  BP:   Pulse:   Temp: 98.2 F (36.8 C)  Resp:     General: alert comfortable Cardiovascular: s1s2  Respiratory: ctab  Discharge Instructions You were cared for by a hospitalist during your hospital stay. If you have any questions about your discharge medications or the care you received while you were in the hospital after you are discharged, you can call the unit and asked to speak with the hospitalist on call if the hospitalist that took care of you is not available. Once you are discharged, your primary care physician will handle  any further medical issues. Please note that NO REFILLS for any discharge medications will be authorized once you are discharged, as it is imperative that you return to your primary care physician (or establish a relationship with a primary care physician if you do not have one) for your aftercare needs so that they can reassess your need for medications and monitor your lab values.   Current Discharge Medication List    START taking these medications   Details  !! Amino Acids-Protein Hydrolys (FEEDING SUPPLEMENT, PRO-STAT SUGAR FREE 64,) LIQD Place 30 mLs into feeding tube daily at 3 pm. Qty: 900 mL, Refills: 0    levETIRAcetam (KEPPRA) 500 MG tablet Take 1 tablet (500 mg total) by mouth 2 (two) times daily.    Water For Irrigation, Sterile (FREE WATER) SOLN Place 150 mLs into feeding tube every 6 (six) hours.     !! - Potential duplicate medications found. Please discuss with provider.    CONTINUE these medications which have CHANGED   Details  LORazepam (ATIVAN) 0.5 MG tablet Place 1 tablet (0.5 mg total) into feeding tube every 6 (six) hours as needed (for agitation). Qty: 15 tablet, Refills: 0    metoprolol tartrate (LOPRESSOR) 12.5 mg TABS tablet Place 1.5 tablets (37.5 mg total) into feeding tube 2 (two) times daily.    Nutritional Supplements (FEEDING SUPPLEMENT, JEVITY 1.2 CAL,) LIQD Place 1,000 mLs into feeding tube continuous. Refills: 0      CONTINUE these medications which have NOT CHANGED   Details  acetaminophen (TYLENOL) 325 MG tablet 650 mg by PEG Tube route every 6 (six) hours as needed for mild pain or fever.    albuterol (PROVENTIL) (2.5 MG/3ML) 0.083% nebulizer solution Take 2.5 mg by nebulization every 2 (two) hours as needed for wheezing or shortness of breath.    !! Amino Acids-Protein Hydrolys (FEEDING SUPPLEMENT, PRO-STAT SUGAR FREE 64,) LIQD 30 mLs by PEG Tube route 2 (two) times daily.    apixaban (ELIQUIS) 5 MG TABS tablet 5 mg by PEG Tube route 2 (two)  times daily.    famotidine (PEPCID) 20 MG tablet 20 mg by PEG Tube route 2 (two) times daily.    ferrous sulfate 325 (65 FE) MG tablet 325 mg by PEG Tube route daily.    furosemide (LASIX) 40 MG tablet 40 mg by PEG Tube route daily as needed (for leg swelling.  Give PRN potassium when Lasix is given).    Lacosamide (VIMPAT) 150 MG TABS 150 mg by PEG Tube route 2 (two) times daily.    magnesium oxide (MAG-OX) 400 MG tablet 400 mg by PEG Tube route 3 (three) times daily.    Multiple Vitamin (MULTIVITAMIN) LIQD 5 mLs by PEG Tube route daily.    ondansetron (ZOFRAN) 4 MG tablet 4 mg by PEG Tube route every 4 (four) hours as needed for nausea.    potassium chloride 20 MEQ/15ML (10%) solution 20 mEq by PEG Tube route daily as needed (Adminiter with PRN Lasix.).    PRESCRIPTION MEDICATION Apply 1 mg topically every 6 (six)  hours as needed (ATIVAN GEL for agitation).     !! - Potential duplicate medications found. Please discuss with provider.    STOP taking these medications     ertapenem (INVANZ) 1 G injection      ketorolac (TORADOL) 30 MG/ML injection      phenytoin (DILANTIN) 125 MG/5ML suspension      Valproic Acid (DEPAKENE) 250 MG/5ML SYRP syrup        Allergies  Allergen Reactions  . Aspirin Other (See Comments)    REACTION: unknown  . Codeine Other (See Comments)    REACTION: unknown  . Hctz [Hydrochlorothiazide] Other (See Comments)    REACTION: unknown  . Hydralazine Other (See Comments)    REACTION: unknown  . Lipitor [Atorvastatin] Other (See Comments)    REACTION: unknown  . Reserpine Other (See Comments)    REACTION: unknown  . Tramadol Other (See Comments)    REACTION: unknown   Follow-up Information   Follow up with GIBSON,DAVID, MD. Schedule an appointment as soon as possible for a visit in 1 week.   Specialty:  Family Medicine   Contact information:   [address] [place] [address] [phone]        The results of significant  diagnostics from this hospitalization (including imaging, microbiology, ancillary and laboratory) are listed below for reference.    Significant Diagnostic Studies: Dg Chest 1 View  05/17/2014   CLINICAL DATA:  Status post left thoracentesis.  EXAM: CHEST - 1 VIEW  COMPARISON:  Radiograph 05/16/2014  FINDINGS: Interval decreased in volume of the left pleural effusion. Only minimal pleural fluid remains in the left hemi thorax. No pneumothorax identified. Cardiomegaly noted.  IMPRESSION: 1. No evidence of pneumothorax following left thoracentesis. 2. Marked reduction in pleural fluid on the left.   Electronically Signed   By: Suzy Bouchard M.D.   On: 05/17/2014 10:06   Ct Head Wo Contrast  05/13/2014   CLINICAL DATA:  Altered mental status.  EXAM: CT HEAD WITHOUT CONTRAST  TECHNIQUE: Contiguous axial images were obtained from the base of the skull through the vertex without intravenous contrast.  COMPARISON:  03/09/2014  FINDINGS: Area of encephalomalacia in the posterior left parietal lobe compatible with old infarct, stable. Previously seen subdural hematomas have resolved. Mild chronic microvascular changes throughout the deep white matter. No acute hemorrhage or infarct. No hydrocephalus.  No acute calvarial abnormality. Visualized paranasal sinuses and mastoids clear. Orbital soft tissues unremarkable.  IMPRESSION: Fold of posterior left MCA infarct with encephalomalacia.  Mild chronic microvascular changes.  No acute intracranial abnormality.   Electronically Signed   By: Rolm Baptise M.D.   On: 05/13/2014 14:17   Dg Chest Port 1 View  05/18/2014   CLINICAL DATA:  Recurrent left pleural effusion ; thoracentesis yesterday.  EXAM: PORTABLE CHEST - 1 VIEW  COMPARISON:  Portable chest x-ray of May 17, 2014  FINDINGS: The left pleural effusion has increased slightly in size but is still small. There is no pneumothorax. The right pleural space is clear as is the right lung. The cardiopericardial  silhouette is mildly enlarged but stable. The pulmonary vascularity is not engorged.  IMPRESSION: There has been a mild interval increase in the volume of pleural fluid on the left since yesterday's study.   Electronically Signed   By: David  Martinique   On: 05/18/2014 20:20   Dg Chest Port 1 View  05/16/2014   CLINICAL DATA:  Left lower lobe pneumonia.  EXAM: PORTABLE CHEST -  1 VIEW  COMPARISON:  05/13/2014  FINDINGS: Large left pleural effusion observed with only a small amount of residual apical aerated lung on the left. Most of the hemithorax appears filled with fluid. Passive atelectasis on the left.  Atherosclerotic aortic arch. Nodular density previously seen at the right lung base is no longer present. Old right rib fractures noted.  IMPRESSION: 1. Large left pleural effusion fills 80% of the hemithorax. Associated passive atelectasis noted.   Electronically Signed   By: Sherryl Barters M.D.   On: 05/16/2014 12:51   Dg Chest Port 1 View  05/13/2014   CLINICAL DATA:  Altered mental status  EXAM: PORTABLE CHEST - 1 VIEW  COMPARISON:  Portable exam 1408 hr compared to 02/17/2014  FINDINGS: Enlargement of cardiac silhouette.  Atherosclerotic calcification aorta.  Pulmonary vascularity normal.  Slight prominent RIGHT peritracheal soft tissues though this may be slightly accentuated versus previous study due to rotation to the LEFT.  Atelectasis versus consolidation of LEFT lower lobe.  Accompanying LEFT pleural effusion as well.  Ovoid nodular density versus superimposed clothing artifact at RIGHT base 19 x 14 mm.  RIGHT lung otherwise clear.  No pneumothorax.  Bones demineralized.  IMPRESSION: Enlargement of cardiac silhouette.  Atelectasis versus consolidation in LEFT lower lobe with associated LEFT pleural effusion.  Questionable nodular density RIGHT lung base versus superimposed artifact; followup upright PA and lateral chest radiographs recommended to further assess ; if patient is unable to perform  upright imaging, an upright AP radiograph would then be recommended.   Electronically Signed   By: Lavonia Dana M.D.   On: 05/13/2014 14:25   US Thoracentesis Asp Pleural Space W/img Guide  05/17/2014   CLINICAL DATA:  Shortness of breath, left pleural effusion, request for thoracentesis.  EXAM: ULTRASOUND GUIDED LEFT DIAGNOSTIC AND THERAPEUTIC THORACENTESIS  COMPARISON:  None.  PROCEDURE: An ultrasound guided thoracentesis was thoroughly discussed with the patient and questions answered. The benefits, risks, alternatives and complications were also discussed. The patient understands and wishes to proceed with the procedure. Written consent was obtained.  Ultrasound was performed to localize and mark an adequate pocket of fluid in the left chest. The area was then prepped and draped in the normal sterile fashion. 1% Lidocaine was used for local anesthesia. Under ultrasound guidance a 19 gauge Yueh catheter was introduced. Thoracentesis was performed. The catheter was removed and a dressing applied.  Complications:  None immediate.  FINDINGS: A total of approximately 1 liter of clear yellow fluid was removed. A fluid sample was sent for laboratory analysis.  IMPRESSION: Successful ultrasound guided left thoracentesis yielding 1 liter of pleural fluid.  Read By:  Tsosie Billing PA-C   Electronically Signed   By: Daryll Brod M.D.   On: 05/17/2014 10:26    Microbiology: Recent Results (from the past 240 hour(s))  CULTURE, BLOOD (ROUTINE X 2)     Status: None   Collection Time    05/13/14  9:14 PM      Result Value Ref Range Status   Specimen Description BLOOD RIGHT HAND   Final   Special Requests BOTTLES DRAWN AEROBIC ONLY 4CC   Final   Culture  Setup Time     Final   Value: 05/14/2014 00:29     Performed at [facility]   Culture     Final   Value: NO GROWTH 5 DAYS     Performed at [facility]   Report Status 05/20/2014 FINAL   Final  CULTURE,  BLOOD (ROUTINE X 2)     Status: None    Collection Time    05/13/14  9:27 PM      Result Value Ref Range Status   Specimen Description BLOOD LEFT ARM   Final   Special Requests BOTTLES DRAWN AEROBIC ONLY 3CC   Final   Culture  Setup Time     Final   Value: 05/14/2014 00:30     Performed at [facility]   Culture     Final   Value: NO GROWTH 5 DAYS     Performed at [facility]   Report Status 05/20/2014 FINAL   Final  MRSA PCR SCREENING     Status: None   Collection Time    05/13/14 10:05 PM      Result Value Ref Range Status   MRSA by PCR NEGATIVE  NEGATIVE Final   Comment:            The GeneXpert MRSA Assay (FDA     approved for NASAL specimens     only), is one component of a     comprehensive MRSA colonization     surveillance program. It is not     intended to diagnose MRSA     infection nor to guide or     monitor treatment for     MRSA infections.  GRAM STAIN     Status: None   Collection Time    05/17/14  9:42 AM      Result Value Ref Range Status   Specimen Description PLEURAL FLUID LEFT   Final   Special Requests 60ML FLUID   Final   Gram Stain     Final   Value: CYTOSPIN PREP     WBC PRESENT,BOTH PMN AND MONONUCLEAR     NO ORGANISMS SEEN   Report Status 05/17/2014 FINAL   Final  BODY FLUID CULTURE     Status: None   Collection Time    05/17/14  9:42 AM      Result Value Ref Range Status   Specimen Description PLEURAL FLUID LEFT   Final   Special Requests 60ML FLUID   Final   Gram Stain     Final   Value: CYTOSPIN WBC PRESENT,BOTH PMN AND MONONUCLEAR     NO ORGANISMS SEEN     Performed at [facility]     Performed at [facility]   Culture     Final   Value: NO GROWTH 3 DAYS     Performed at [facility]   Report Status 05/20/2014 FINAL   Final  CLOSTRIDIUM DIFFICILE BY PCR     Status: None   Collection Time    05/18/14 11:25 AM      Result Value Ref Range Status   C difficile by pcr NEGATIVE  NEGATIVE Final     Labs: Basic Metabolic  Panel:  Recent Labs Lab 05/15/14 0258 05/16/14 0340 05/18/14 0340 05/19/14 1040 05/20/14 0306  NA 130* 133* 138 131* 132*  K 4.5 4.4 3.4* 4.7 4.7  CL 96 95* 104 97 96  CO2 25 26 24 27 28   GLUCOSE 103* 110* 100* 116* 119*  BUN 14 11 9 12 14   CREATININE 0.43* 0.37* 0.27* 0.38* 0.40*  CALCIUM 7.9* 8.4 6.7* 8.5 8.8   Liver Function Tests:  Recent Labs Lab 05/14/14 0840 05/18/14 1630 05/19/14 1040 05/20/14 0306  AST 20  --  35 37  ALT 10  --  14  16  ALKPHOS 365*  --  307* 337*  BILITOT 0.3  --  0.4 0.3  PROT 6.9  --  5.8* 6.1  ALBUMIN 3.0* 2.2* 2.1* 2.1*   No results found for this basename: LIPASE, AMYLASE,  in the last 168 hours No results found for this basename: AMMONIA,  in the last 168 hours CBC:  Recent Labs Lab 05/14/14 0840 05/15/14 0258 05/15/14 1133 05/16/14 0340 05/18/14 0340  WBC 9.3 7.6  --  10.8* 8.7  HGB 11.6* 9.5* 10.5* 10.7* 9.5*  HCT 33.5* 27.9* 31.5* 31.9* 27.6*  MCV 83.8 84.8  --  85.3 87.9  PLT 372 335  --  365 258   Cardiac Enzymes: No results found for this basename: CKTOTAL, CKMB, CKMBINDEX, TROPONINI,  in the last 168 hours BNP: BNP (last 3 results) No results found for this basename: PROBNP,  in the last 8760 hours CBG:  Recent Labs Lab 05/19/14 1940 05/20/14 0018 05/20/14 0427 05/20/14 0736 05/20/14 1111  GLUCAP 132* 126* 138* 149* 145*       Signed:  Ziya Coonrod  [facility] 05/20/2014, 2:15 PM

## 2014-05-20 NOTE — Progress Notes (Signed)
Utilization review completed.  

## 2014-05-20 NOTE — Progress Notes (Signed)
Pt discharged to SNF per MD order. Report called to receiving nurse and all questions answered. Family aware of transfer.

## 2014-05-20 NOTE — Clinical Social Work Note (Addendum)
CSW updated by [facility] [employer]. Tanya White reported that the agency will not take the pt back. CSW updated the family. CSW is seeking another SNF placement for the pt.  Addendum: CSW received a follow up call from [place] reporting the a family meeting was conducted and the pt can return back. PTAR was arranged. Cathlean Sauer is the nephew (POA) [phone] & Katrina is the niece [phone] were notified.    [place], MSW, [place]

## 2014-05-20 NOTE — Progress Notes (Signed)
Physical Therapy Treatment Patient Details Name: Tanya White MRN: [id number] DOB: 29-Oct-1935 Today's Date: 05/20/2014    History of Present Illness Patient is a 78 year old female with known history of seizure disorder on multiple seizure medications, history of left MCA CVA, dysphagia with PEG tube was recently admitted at [facility], discharged on 9/14 when she was admitted with status epilepticus. patient was scheduled to have CT of the head and EEG 05/13/14 due to cognitive dysfunction, the patient was brought from the nursing facility by the EMS once found minimally responsive.    PT Comments    Pt cognition improved since initial eval and past treatment session. Pt able to perform some aspects of bed mobility, but still requires assistance. Pt demonstrating more active movement during session than with previous treatments. Hygiene and pericare performed 2/2 pt incontinent.   Follow Up Recommendations  SNF     Equipment Recommendations  None recommended by PT    Recommendations for Other Services       Precautions / Restrictions Precautions Precautions: Fall Restrictions Weight Bearing Restrictions: No    Mobility  Bed Mobility Overal bed mobility: Needs Assistance Bed Mobility: Rolling Rolling: Max assist         General bed mobility comments: Pt able to assist with rolling to R using L UE, still requiring multimodal cueing  Transfers                    Ambulation/Gait                 Stairs            Wheelchair Mobility    Modified Rankin (Stroke Patients Only)       Balance                                    Cognition Arousal/Alertness: Awake/alert Behavior During Therapy: Flat affect Overall Cognitive Status: Impaired/Different from baseline Area of Impairment: Orientation;Attention;Memory;Following commands;Awareness;Problem solving Orientation Level: Disoriented  to;Place;Time;Situation Current Attention Level: Sustained   Following Commands: Follows one step commands with increased time;Follows one step commands inconsistently   Awareness: Intellectual Problem Solving: Slow processing;Decreased initiation;Requires verbal cues;Requires tactile cues General Comments: Pt demonstrating cognitive improvement since initial eval. Pt with confusion regarding orientation and difficulty providing relevant answers to open ended question. Pt able to respond appropriately to yes or no questions. Pt able to follow single step commands better, still requiring max multimodal cueing and extra time for initiation of activity.     Exercises      General Comments General comments (skin integrity, edema, etc.): hygiene and pericare performed 2/2 pt incontinent. NSG made aware of sores and skin degradation.       Pertinent Vitals/Pain Pain Assessment: No/denies pain    Home Living                      Prior Function            PT Goals (current goals can now be found in the care plan section) Acute Rehab PT Goals Patient Stated Goal: none stated PT Goal Formulation: With patient/family Time For Goal Achievement: 05/28/14 Potential to Achieve Goals: Fair Progress towards PT goals: Progressing toward goals    Frequency  Min 3X/week    PT Plan Current plan remains appropriate    Co-evaluation  End of Session   Activity Tolerance: Patient tolerated treatment well Patient left: in bed;with nursing/sitter in room     Time: 1456-1517 PT Time Calculation (min): 21 min  Charges:  $Therapeutic Activity: 8-22 mins                    G Codes:      Romelo Sciandra 05/20/2014, 4:07 PM Levonne Hubert, SPT

## 2014-06-11 ENCOUNTER — Encounter: Payer: Self-pay | Admitting: Neurology

## 2014-06-11 ENCOUNTER — Ambulatory Visit (INDEPENDENT_AMBULATORY_CARE_PROVIDER_SITE_OTHER): Payer: PRIVATE HEALTH INSURANCE | Admitting: Neurology

## 2014-06-11 ENCOUNTER — Telehealth: Payer: Self-pay | Admitting: Neurology

## 2014-06-11 VITALS — BP 140/75 | HR 62

## 2014-06-11 DIAGNOSIS — R413 Other amnesia: Secondary | ICD-10-CM

## 2014-06-11 NOTE — Telephone Encounter (Signed)
Fax note to Dr. Marcellus Scott, Phone [phone]

## 2014-06-11 NOTE — Telephone Encounter (Signed)
Notes faxed on 06/11/14

## 2014-06-11 NOTE — Progress Notes (Signed)
PATIENT: Tanya White DOB: May 19, 1936  HISTORICAL  Delayne Sanzo is a 78 years old right-handed African-American female, is accompanied by her Nephew Cathlean Sauer for evaluation of worsening functional status, she is currently nursing home resident  She had a past medical history of stroke in 2012, with residual right-sided weakness, MRI of the brain at that time showed smaller foci of restricted diffusion are seen in the superior right frontal lobe at the level of the centrum semiovale,  Multiple infarcts are also seen in the cerebellum, and bilateral frontal lobes. Suggestive of embolic event,  Even after that stroke, she lives alone, driving, independent of her living untill June 2015, she suffered acute pancreatitis rupture, with pelvic abscess, she was treated with multiple hospital admission at [facility] prolonged antibiotics,  During that period of time, she also developed complex partial seizure, sounds like partial status, was treated with titrating dose of antiseptic medications, initially with Dilantin, Depakote, later also developed Dilantin toxicity, dizziness, confusion, sleepiness,  She was eventually discharged to rehabilitation center at [facility], [place] [facility], in September 2015 [place] evaluations, Dilantin level was 35, Depakote was less than 10, she was switched over to [place], 500 mg twice a day, Vimpat 150 mg twice a day, she has no recurrent seizure, gradually recovered, much less drowsy  She was noted continue have worsening memory trouble, much worse than her baseline, gait difficulty,  She is now taking Eliquis, long term anticoagulation, had left thoracentesis for pleural effusion  Most recent CAT scan in 2015 has demonstrated no acute lesions, left parietal area chronic stroke,  [facility] Video EEG monitoring: This study is consist with moderate cerebral dysfunction and encephalopathy and ongoing left posterior quadrant cortical  irritability. Lateralizing epileptiform discharges may be indicative of an underlying structural abnormality.    REVIEW OF SYSTEMS: Full 14 system review of systems performed and notable only for confusion, gait difficulties, seizure, blurry vision, hearing loss ALLERGIES: Allergies  Allergen Reactions  . Aspirin Other (See Comments) and Diarrhea    Only ASA 325mg  ; pt can tolerate low dose asa 81mg    Reaction : GI upset REACTION: unknown  . Codeine Other (See Comments)    Other reaction(s): SHORTNESS OF BREATH REACTION: unknown  . Hydralazine Other (See Comments)    REACTION: unknown  . Lipitor [Atorvastatin] Other (See Comments) and Diarrhea    GI upset, rhabdomyalosis REACTION: unknown  . Reserpine Other (See Comments)    REACTION: unknown  . Tramadol Other (See Comments)    REACTION: unknown  . Hctz [Hydrochlorothiazide] Other (See Comments) and Rash    REACTION: unknown    HOME MEDICATIONS: Current Outpatient Prescriptions on File Prior to Visit  Medication Sig Dispense Refill  . acetaminophen (TYLENOL) 325 MG tablet 650 mg by PEG Tube route every 6 (six) hours as needed for mild pain or fever.      Marland Kitchen albuterol (PROVENTIL) (2.5 MG/3ML) 0.083% nebulizer solution Take 2.5 mg by nebulization every 2 (two) hours as needed for wheezing or shortness of breath.      . Amino Acids-Protein Hydrolys (FEEDING SUPPLEMENT, PRO-STAT SUGAR FREE 64,) LIQD 30 mLs by PEG Tube route 2 (two) times daily.      . Amino Acids-Protein Hydrolys (FEEDING SUPPLEMENT, PRO-STAT SUGAR FREE 64,) LIQD Place 30 mLs into feeding tube daily at 3 pm.  900 mL  0  . apixaban (ELIQUIS) 5 MG TABS tablet 5 mg by PEG Tube route 2 (two) times daily.      Marland Kitchen  famotidine (PEPCID) 20 MG tablet 20 mg by PEG Tube route 2 (two) times daily.      . ferrous sulfate 325 (65 FE) MG tablet 325 mg by PEG Tube route daily.      . furosemide (LASIX) 40 MG tablet 40 mg by PEG Tube route daily as needed (for leg swelling.  Give PRN  potassium when Lasix is given).      . Lacosamide (VIMPAT) 150 MG TABS 150 mg by PEG Tube route 2 (two) times daily.      Marland Kitchen levETIRAcetam (KEPPRA) 500 MG tablet Take 1 tablet (500 mg total) by mouth 2 (two) times daily.      Marland Kitchen LORazepam (ATIVAN) 0.5 MG tablet Place 1 tablet (0.5 mg total) into feeding tube every 6 (six) hours as needed (for agitation).  15 tablet  0  . magnesium oxide (MAG-OX) 400 MG tablet 400 mg by PEG Tube route 3 (three) times daily.      . metoprolol tartrate (LOPRESSOR) 12.5 mg TABS tablet Place 1.5 tablets (37.5 mg total) into feeding tube 2 (two) times daily.      . Multiple Vitamin (MULTIVITAMIN) LIQD 5 mLs by PEG Tube route daily.      . Nutritional Supplements (FEEDING SUPPLEMENT, JEVITY 1.2 CAL,) LIQD Place 1,000 mLs into feeding tube continuous.    0  . ondansetron (ZOFRAN) 4 MG tablet 4 mg by PEG Tube route every 4 (four) hours as needed for nausea.      . potassium chloride 20 MEQ/15ML (10%) solution 20 mEq by PEG Tube route daily as needed (Adminiter with PRN Lasix.).      Marland Kitchen PRESCRIPTION MEDICATION Apply 1 mg topically every 6 (six) hours as needed (ATIVAN GEL for agitation).      . Water For Irrigation, Sterile (FREE WATER) SOLN Place 150 mLs into feeding tube every 6 (six) hours.       No current facility-administered medications on file prior to visit.    PAST MEDICAL HISTORY: Past Medical History  Diagnosis Date  . Seizures   . Hypotension   . Pneumonia   . Stroke   . GI bleed   . Pulmonary embolism   . Gastrostomy tube in place   . Memory loss     PAST SURGICAL HISTORY: Past Surgical History  Procedure Laterality Date  . Appendectomy    . Jejunostomy feeding tube      FAMILY HISTORY: Family History  Problem Relation Age of Onset  . High blood pressure    . Cancer    . Stroke      SOCIAL HISTORY:  History   Social History  . Marital Status: Single    Spouse Name: N/A    Number of Children: 2  . Years of Education: N/A    Occupational History    Retired   Social History Main Topics  . Smoking status: Former Smoker -- 0.50 packs/day for 15 years  . Smokeless tobacco: Never Used  . Alcohol Use: No  . Drug Use: No  . Sexual Activity: Not on file   Other Topics Concern  . Not on file   Social History Narrative   Patient lives in [address] [place] [address] [place]   Patient is retired.   Caffeine one cup daily.   PHYSICAL EXAM   Filed Vitals:   06/11/14 1054  BP: 140/75  Pulse: 62    Not recorded    Cannot calculate BMI with a height equal to zero.   Generalized: In  no acute distress  Neck: Supple, no carotid bruits   Cardiac: Regular rate rhythm  Pulmonary: Clear to auscultation bilaterally  Musculoskeletal: No deformity  Neurological examination  Mentation: She sees in wheelchair, depend on her nephew to provide history, Mini-Mental Status examination is 8 out of 30 today, she is oriented to city, has difficulty repeating, was able to follow 3 steps command, but could not copy, writing, could not register information, left right confusion, finger agnosia,  Cranial nerve II-XII: Pupils were equal round reactive to light. Extraocular movements were full.  Visual field were full on confrontational test. Bilateral fundi were sharp.  Facial sensation and strength were normal. Hearing was intact to finger rubbing bilaterally. Uvula tongue midline.  Head turning and shoulder shrug and were normal and symmetric.Tongue protrusion into cheek strength was normal.  Motor: She has mild right arm weakness, fixation on rapid rotating movement, mild right proximal, and distal leg weakness.  Sensory: Not reliable.  Coordination: Normal finger to nose, heel-to-shin bilaterally there was no truncal ataxia  Gait: She needs assistance to get up from a seated position,, dragging her right leg, unsteady,  Deep tendon reflexes: Brachioradialis 2/2, biceps 2/2, triceps 2/2, patellar 2/2, Achilles 2/2, plantar  responses were flexor bilaterally.   DIAGNOSTIC DATA (LABS, IMAGING, TESTING) - I reviewed patient records, labs, notes, testing and imaging myself where available.  Lab Results  Component Value Date   WBC 8.7 05/18/2014   HGB 9.5* 05/18/2014   HCT 27.6* 05/18/2014   MCV 87.9 05/18/2014   PLT 258 05/18/2014      Component Value Date/Time   NA 132* 05/20/2014 0306   K 4.7 05/20/2014 0306   CL 96 05/20/2014 0306   CO2 28 05/20/2014 0306   GLUCOSE 119* 05/20/2014 0306   BUN 14 05/20/2014 0306   CREATININE 0.40* 05/20/2014 0306   CALCIUM 8.8 05/20/2014 0306   PROT 6.1 05/20/2014 0306   ALBUMIN 2.1* 05/20/2014 0306   AST 37 05/20/2014 0306   ALT 16 05/20/2014 0306   ALKPHOS 337* 05/20/2014 0306   BILITOT 0.3 05/20/2014 0306   GFRNONAA >90 05/20/2014 0306   GFRAA >90 05/20/2014 0306    ASSESSMENT AND PLAN  Chaundra Abreu is a 78 y.o. female with history of embolic stroke, now on long-term anticoagulation Eliquis, since June 2015, she had multiple hospital admission for ruptured pancreatitis, abdominal infection, pelvic abscess, prolonged antibiotic treatment, since July 2015, she also developed frequent complex partial seizure, now taking Keppra 500 mg twice a day, Vimpat 150 mg twice a day since September 2015, tolerating it well, overall improvement, but remained confused, not back to her baseline, Mini-Mental Status examinations are 8 out of 30, currently nursing home resident,  1, most likely she suffered some central nervous system degenerative disorder, mild cognitive impairment as baseline, likely a vascular component from her previous multiple embolic strokes, now has much worsened with her recent acute fulminant infection, prolonged complex partial seizure,  with the degree of memory loss, confusion that she is in right now, Mini-Mental Status examination is only 8 out of 30, more than likely, she will need long-term help.  2 continue current Keppra 500 mg twice a day, Vimpat 150 mg twice a  day.  3. Return to clinic in 3 months.   60 minutes spent in coordinating her care, outside hospital record, more than 50% of time was face to face patient consultation  Marcial Pacas, M.D. Ph.D.  [facility] [address], [place], [place]  [address] [phone] 

## 2014-07-16 ENCOUNTER — Inpatient Hospital Stay (HOSPITAL_COMMUNITY)
Admission: EM | Admit: 2014-07-16 | Discharge: 2014-07-22 | DRG: 101 | Disposition: A | Discharge status: Skilled Nursing Facility | Payer: PRIVATE HEALTH INSURANCE | Attending: Internal Medicine | Admitting: Internal Medicine

## 2014-07-16 ENCOUNTER — Inpatient Hospital Stay (HOSPITAL_COMMUNITY): Payer: PRIVATE HEALTH INSURANCE

## 2014-07-16 ENCOUNTER — Emergency Department (HOSPITAL_COMMUNITY): Payer: PRIVATE HEALTH INSURANCE

## 2014-07-16 ENCOUNTER — Encounter (HOSPITAL_COMMUNITY): Payer: Self-pay

## 2014-07-16 DIAGNOSIS — I69321 Dysphasia following cerebral infarction: Secondary | ICD-10-CM | POA: Diagnosis not present

## 2014-07-16 DIAGNOSIS — J189 Pneumonia, unspecified organism: Secondary | ICD-10-CM

## 2014-07-16 DIAGNOSIS — Z7901 Long term (current) use of anticoagulants: Secondary | ICD-10-CM | POA: Diagnosis not present

## 2014-07-16 DIAGNOSIS — Z66 Do not resuscitate: Secondary | ICD-10-CM | POA: Diagnosis present

## 2014-07-16 DIAGNOSIS — Z886 Allergy status to analgesic agent status: Secondary | ICD-10-CM | POA: Diagnosis not present

## 2014-07-16 DIAGNOSIS — Z885 Allergy status to narcotic agent status: Secondary | ICD-10-CM | POA: Diagnosis not present

## 2014-07-16 DIAGNOSIS — G40901 Epilepsy, unspecified, not intractable, with status epilepticus: Secondary | ICD-10-CM | POA: Diagnosis not present

## 2014-07-16 DIAGNOSIS — N390 Urinary tract infection, site not specified: Secondary | ICD-10-CM | POA: Diagnosis present

## 2014-07-16 DIAGNOSIS — I639 Cerebral infarction, unspecified: Secondary | ICD-10-CM | POA: Diagnosis present

## 2014-07-16 DIAGNOSIS — K9422 Gastrostomy infection: Secondary | ICD-10-CM | POA: Diagnosis not present

## 2014-07-16 DIAGNOSIS — B998 Other infectious disease: Secondary | ICD-10-CM | POA: Diagnosis present

## 2014-07-16 DIAGNOSIS — R569 Unspecified convulsions: Secondary | ICD-10-CM

## 2014-07-16 DIAGNOSIS — I2699 Other pulmonary embolism without acute cor pulmonale: Secondary | ICD-10-CM

## 2014-07-16 DIAGNOSIS — Z9109 Other allergy status, other than to drugs and biological substances: Secondary | ICD-10-CM

## 2014-07-16 DIAGNOSIS — K922 Gastrointestinal hemorrhage, unspecified: Secondary | ICD-10-CM | POA: Insufficient documentation

## 2014-07-16 DIAGNOSIS — Z7982 Long term (current) use of aspirin: Secondary | ICD-10-CM | POA: Diagnosis not present

## 2014-07-16 DIAGNOSIS — I69391 Dysphagia following cerebral infarction: Secondary | ICD-10-CM

## 2014-07-16 DIAGNOSIS — Z86711 Personal history of pulmonary embolism: Secondary | ICD-10-CM

## 2014-07-16 DIAGNOSIS — Z931 Gastrostomy status: Secondary | ICD-10-CM

## 2014-07-16 DIAGNOSIS — D649 Anemia, unspecified: Secondary | ICD-10-CM | POA: Diagnosis present

## 2014-07-16 DIAGNOSIS — G9389 Other specified disorders of brain: Secondary | ICD-10-CM | POA: Diagnosis present

## 2014-07-16 DIAGNOSIS — Z8673 Personal history of transient ischemic attack (TIA), and cerebral infarction without residual deficits: Secondary | ICD-10-CM | POA: Diagnosis not present

## 2014-07-16 DIAGNOSIS — R4702 Dysphasia: Secondary | ICD-10-CM

## 2014-07-16 DIAGNOSIS — I69820 Aphasia following other cerebrovascular disease: Secondary | ICD-10-CM

## 2014-07-16 DIAGNOSIS — G40301 Generalized idiopathic epilepsy and epileptic syndromes, not intractable, with status epilepticus: Secondary | ICD-10-CM | POA: Diagnosis present

## 2014-07-16 DIAGNOSIS — Z79899 Other long term (current) drug therapy: Secondary | ICD-10-CM | POA: Diagnosis not present

## 2014-07-16 DIAGNOSIS — Z87891 Personal history of nicotine dependence: Secondary | ICD-10-CM | POA: Diagnosis not present

## 2014-07-16 DIAGNOSIS — I69851 Hemiplegia and hemiparesis following other cerebrovascular disease affecting right dominant side: Secondary | ICD-10-CM

## 2014-07-16 LAB — DIFFERENTIAL
Basophils Absolute: 0 10*3/uL (ref 0.0–0.1)
Basophils Relative: 0 % (ref 0–1)
EOS ABS: 0.5 10*3/uL (ref 0.0–0.7)
EOS PCT: 5 % (ref 0–5)
LYMPHS PCT: 9 % — AB (ref 12–46)
Lymphs Abs: 0.8 10*3/uL (ref 0.7–4.0)
MONOS PCT: 4 % (ref 3–12)
Monocytes Absolute: 0.3 10*3/uL (ref 0.1–1.0)
NEUTROS ABS: 7.3 10*3/uL (ref 1.7–7.7)
Neutrophils Relative %: 82 % — ABNORMAL HIGH (ref 43–77)

## 2014-07-16 LAB — I-STAT CHEM 8, ED
BUN: 25 mg/dL — AB (ref 6–23)
CALCIUM ION: 1.18 mmol/L (ref 1.13–1.30)
Chloride: 103 mEq/L (ref 96–112)
Creatinine, Ser: 0.6 mg/dL (ref 0.50–1.10)
GLUCOSE: 147 mg/dL — AB (ref 70–99)
HEMATOCRIT: 38 % (ref 36.0–46.0)
Hemoglobin: 12.9 g/dL (ref 12.0–15.0)
Potassium: 3.9 mEq/L (ref 3.7–5.3)
Sodium: 139 mEq/L (ref 137–147)
TCO2: 23 mmol/L (ref 0–100)

## 2014-07-16 LAB — COMPREHENSIVE METABOLIC PANEL
ALT: 7 U/L (ref 0–35)
ANION GAP: 12 (ref 5–15)
AST: 12 U/L (ref 0–37)
Albumin: 3 g/dL — ABNORMAL LOW (ref 3.5–5.2)
Alkaline Phosphatase: 104 U/L (ref 39–117)
BUN: 24 mg/dL — AB (ref 6–23)
CO2: 24 meq/L (ref 19–32)
Calcium: 8.4 mg/dL (ref 8.4–10.5)
Chloride: 101 mEq/L (ref 96–112)
Creatinine, Ser: 0.59 mg/dL (ref 0.50–1.10)
GFR calc Af Amer: >90 mL/min (ref >90)
GFR calc non Af Amer: 87 mL/min — ABNORMAL LOW (ref >90)
GLUCOSE: 140 mg/dL — AB (ref 70–99)
POTASSIUM: 4 meq/L (ref 3.7–5.3)
Sodium: 137 mEq/L (ref 137–147)
TOTAL PROTEIN: 6.9 g/dL (ref 6.0–8.3)
Total Bilirubin: 0.2 mg/dL — ABNORMAL LOW (ref 0.3–1.2)

## 2014-07-16 LAB — CBC
HEMATOCRIT: 32.9 % — AB (ref 36.0–46.0)
Hemoglobin: 11.4 g/dL — ABNORMAL LOW (ref 12.0–15.0)
MCH: 28.3 pg (ref 26.0–34.0)
MCHC: 34.7 g/dL (ref 30.0–36.0)
MCV: 81.6 fL (ref 78.0–100.0)
PLATELETS: 287 10*3/uL (ref 150–400)
RBC: 4.03 MIL/uL (ref 3.87–5.11)
RDW: 15.1 % (ref 11.5–15.5)
WBC: 9 10*3/uL (ref 4.0–10.5)

## 2014-07-16 LAB — PROTIME-INR
INR: 1.55 — AB (ref 0.00–1.49)
Prothrombin Time: 18.7 seconds — ABNORMAL HIGH (ref 11.6–15.2)

## 2014-07-16 LAB — ETHANOL: Alcohol, Ethyl (B): <11 mg/dL (ref 0–11)

## 2014-07-16 LAB — CBG MONITORING, ED: Glucose-Capillary: 138 mg/dL — ABNORMAL HIGH (ref 70–99)

## 2014-07-16 LAB — I-STAT TROPONIN, ED: Troponin i, poc: 0 ng/mL (ref 0.00–0.08)

## 2014-07-16 LAB — APTT: APTT: 39 s — AB (ref 24–37)

## 2014-07-16 MED ORDER — LEVETIRACETAM IN NACL 1000 MG/100ML IV SOLN
1000.0000 mg | INTRAVENOUS | Status: AC
  Administered 2014-07-16: 1000 mg via INTRAVENOUS
  Filled 2014-07-16: qty 100

## 2014-07-16 MED ORDER — SODIUM CHLORIDE 0.9 % IV SOLN
200.0000 mg | INTRAVENOUS | Status: AC
  Administered 2014-07-16: 200 mg via INTRAVENOUS
  Filled 2014-07-16: qty 20

## 2014-07-16 MED ORDER — SODIUM CHLORIDE 0.9 % IV BOLUS (SEPSIS)
1000.0000 mL | Freq: Once | INTRAVENOUS | Status: AC
  Administered 2014-07-16: 1000 mL via INTRAVENOUS

## 2014-07-16 MED ORDER — LORAZEPAM 2 MG/ML IJ SOLN
2.0000 mg | Freq: Once | INTRAMUSCULAR | Status: AC
  Administered 2014-07-16: 2 mg via INTRAVENOUS
  Filled 2014-07-16: qty 1

## 2014-07-16 MED ORDER — LORAZEPAM 2 MG/ML IJ SOLN
2.0000 mg | Freq: Once | INTRAMUSCULAR | Status: AC
  Administered 2014-07-16: 2 mg via INTRAVENOUS

## 2014-07-16 MED ORDER — SODIUM CHLORIDE 0.9 % IV SOLN: 200.0000 mg | Freq: Once | INTRAVENOUS | Status: DC

## 2014-07-16 MED ORDER — LEVETIRACETAM IN NACL 1000 MG/100ML IV SOLN: 1000.0000 mg | Freq: Once | INTRAVENOUS | Status: DC

## 2014-07-16 NOTE — H&P (Signed)
[facility] History and Physical  [name] NTI:[id number] DOB: 12-30-1935 DOA: 07/16/2014  Referring physician: ED physician PCP: [name], MD  Specialists:   Chief Complaint: seizure.   HPI: Tanya White is a 78 y.o. female with a past medical history of seizure, stroke, dysphasia with PEG tube placed on 9/4, history of pulmonary embolism on Elliquis, who presents with seizure  Patient is from Tanya White. Patient is deeply sedated when I evaluated the patient in ED. History was from Ed reports and EMS note. It seems that at 1730,  she was noted to have difficulty with speech and right eye deviation. She was brought in as a code stroke initially, but noted to some facial twitching at arrival to ED. Patient had clonic movement of the entire right side when taken to get CT scan. Patient was given 4 mg Iv Ativan with resolution. Neurology was consulted. Code stroke cancelled due to patient being in status epilepticus.    Of note, she is taking antibiotics for possible PEG tube infection, including Flagyl, rifampin and doxycycline since 11/28.   Work up in the ED demonstrates remote posterior left MCA territory infarct with encephalomalacia, but no acute intracranial abnormalities by CT head.  Patient is admitted to inpatient for further evaluation and treatment. Neurology was consulted.  Review of Systems: As presented in the history of presenting illness, rest negative.  Where does patient live?  SNF Can patient participate in ADLs? none   Allergy:  Allergies  Allergen Reactions  . Aspirin Other (See Comments) and Diarrhea    Only ASA 325mg  ; pt can tolerate low dose asa 81mg    Reaction : GI upset REACTION: unknown  . Codeine Other (See Comments)    Other reaction(s): SHORTNESS OF BREATH REACTION: unknown  . Hydralazine Other (See Comments)    REACTION: unknown  . Lipitor [Atorvastatin] Other (See Comments) and Diarrhea    GI upset, rhabdomyalosis REACTION: unknown   . Reserpine Other (See Comments)    REACTION: unknown  . Tramadol Other (See Comments)    REACTION: unknown  . Hctz [Hydrochlorothiazide] Other (See Comments) and Rash    REACTION: unknown    Past Medical History  Diagnosis Date  . Seizures   . Hypotension   . Pneumonia   . Stroke   . GI bleed   . Pulmonary embolism   . Gastrostomy tube in place   . Memory loss     Past Surgical History  Procedure Laterality Date  . Appendectomy    . Jejunostomy feeding tube      Social History:  reports that she has quit smoking. She has never used smokeless tobacco. She reports that she does not drink alcohol or use illicit drugs.  Family History:  Family History  Problem Relation Age of Onset  . High blood pressure    . Cancer    . Stroke       Prior to Admission medications   Medication Sig Start Date End Date Taking? Authorizing Provider  acetaminophen (TYLENOL) 325 MG tablet 650 mg by PEG Tube route every 6 (six) hours as needed for mild pain or fever.   Yes Historical Provider, MD  albuterol (PROVENTIL) (2.5 MG/3ML) 0.083% nebulizer solution Take 2.5 mg by nebulization every 2 (two) hours as needed for wheezing or shortness of breath.   Yes Historical Provider, MD  Amino Acids-Protein Hydrolys (FEEDING SUPPLEMENT, PRO-STAT SUGAR FREE 64,) LIQD 30 mLs by PEG Tube route 2 (two) times daily.   Yes Historical Provider, MD  apixaban (ELIQUIS) 5 MG TABS tablet 5 mg by PEG Tube route 2 (two) times daily.   Yes Historical Provider, MD  doxycycline (VIBRA-TABS) 100 MG tablet Take 100 mg by mouth 2 (two) times daily.   Yes Historical Provider, MD  famotidine (PEPCID) 40 MG/5ML suspension Take 20 mg by mouth 2 (two) times daily.   Yes Historical Provider, MD  ferrous sulfate 325 (65 FE) MG tablet 325 mg by PEG Tube route daily.   Yes Historical Provider, MD  furosemide (LASIX) 40 MG tablet 40 mg by PEG Tube route daily as needed (for leg swelling.  Give PRN potassium when Lasix is given).    Yes Historical Provider, MD  Lacosamide (VIMPAT) 150 MG TABS 150 mg by PEG Tube route 2 (two) times daily.   Yes Historical Provider, MD  levETIRAcetam (KEPPRA) 100 MG/ML solution Take 500 mg by mouth 2 (two) times daily.   Yes Historical Provider, MD  LORazepam (ATIVAN) 0.5 MG tablet Place 1 tablet (0.5 mg total) into feeding tube every 6 (six) hours as needed (for agitation). 05/20/14  Yes Hosie Poisson, MD  magnesium oxide (MAG-OX) 400 MG tablet 400 mg by PEG Tube route 3 (three) times daily.   Yes Historical Provider, MD  metoprolol tartrate (LOPRESSOR) 12.5 mg TABS tablet Place 1.5 tablets (37.5 mg total) into feeding tube 2 (two) times daily. 05/20/14  Yes Hosie Poisson, MD  metroNIDAZOLE (FLAGYL) 500 MG tablet Take 500 mg by mouth 2 (two) times daily.   Yes Historical Provider, MD  Multiple Vitamin (MULTIVITAMIN) LIQD 5 mLs by PEG Tube route daily.   Yes Historical Provider, MD  Nutritional Supplements (FEEDING SUPPLEMENT, JEVITY 1.2 CAL,) LIQD Place 1,000 mLs into feeding tube continuous. 05/20/14  Yes Hosie Poisson, MD  ondansetron (ZOFRAN) 4 MG tablet 4 mg by PEG Tube route every 4 (four) hours as needed for nausea.   Yes Historical Provider, MD  potassium chloride 20 MEQ/15ML (10%) solution 20 mEq by PEG Tube route daily as needed (Adminiter with PRN Lasix.).   Yes Historical Provider, MD  PRESCRIPTION MEDICATION Apply 1 mg topically every 6 (six) hours as needed (ATIVAN GEL for agitation).   Yes Historical Provider, MD  rifampin (RIFADIN) 300 MG capsule Take 300 mg by mouth 2 (two) times daily.   Yes Historical Provider, MD  Water For Irrigation, Sterile (FREE WATER) SOLN Place 150 mLs into feeding tube every 6 (six) hours. 05/20/14  Yes Hosie Poisson, MD  Amino Acids-Protein Hydrolys (FEEDING SUPPLEMENT, PRO-STAT SUGAR FREE 64,) LIQD Place 30 mLs into feeding tube daily at 3 pm. Patient not taking: Reported on 07/16/2014 05/20/14   Hosie Poisson, MD  levETIRAcetam (KEPPRA) 500 MG tablet Take 1  tablet (500 mg total) by mouth 2 (two) times daily. Patient not taking: Reported on 07/16/2014 05/20/14   Hosie Poisson, MD    Physical Exam: Filed Vitals:   07/17/14 0015 07/17/14 0045 07/17/14 0130 07/17/14 0200  BP: 118/63 106/63 99/57 92/67   Pulse:      Resp: 15 14 15 15   SpO2:       General: sedated HEENT:       Eyes: PERRL,  no scleral icterus.        ENT: No discharge from the ears and nose, no pharynx injection, no tonsillar enlargement.        Neck: No JVD, no bruit, no mass felt. Cardiac: S1/S2, RRR, No murmurs, No gallops or rubs Pulm: Good air movement bilaterally. Clear to auscultation bilaterally. No rales, wheezing,  rhonchi or rubs. Abd: Soft, nondistended, no organomegaly, BS present Ext: No edema bilaterally. 2+DP/PT pulse bilaterally Musculoskeletal: No joint deformities, erythema, or stiffness, ROM full Skin: No rashes.  Neuro: deeply sedated,  Brachial reflex 1+ bilaterally. Knee reflex 1+ bilaterally. positive Babinski's sign on the right side.  Psych: Patient is not psychotic.  Labs on Admission:  Basic Metabolic Panel:  Recent Labs Lab 07/16/14 2130 07/16/14 2143  NA 137 139  K 4.0 3.9  CL 101 103  CO2 24  --   GLUCOSE 140* 147*  BUN 24* 25*  CREATININE 0.59 0.60  CALCIUM 8.4  --    Liver Function Tests:  Recent Labs Lab 07/16/14 2130  AST 12  ALT 7  ALKPHOS 104  BILITOT 0.2*  PROT 6.9  ALBUMIN 3.0*   No results for input(s): LIPASE, AMYLASE in the last 168 hours. No results for input(s): AMMONIA in the last 168 hours. CBC:  Recent Labs Lab 07/16/14 2130 07/16/14 2143  WBC 9.0  --   NEUTROABS 7.3  --   HGB 11.4* 12.9  HCT 32.9* 38.0  MCV 81.6  --   PLT 287  --    Cardiac Enzymes: No results for input(s): CKTOTAL, CKMB, CKMBINDEX, TROPONINI in the last 168 hours.  BNP (last 3 results) No results for input(s): PROBNP in the last 8760 hours. CBG:  Recent Labs Lab 07/16/14 2133  GLUCAP 138*    Radiological Exams on  Admission: Ct Head Wo Contrast  07/16/2014   CLINICAL DATA:  Right-sided weakness, code stroke  EXAM: CT HEAD WITHOUT CONTRAST  TECHNIQUE: Contiguous axial images were obtained from the base of the skull through the vertex without contrast.  COMPARISON:  05/13/2014  FINDINGS: Posterior left parietal encephalomalacia again evident from a remote left MCA territory infarct. Diffuse brain atrophy also evident with periventricular white matter microvascular ischemic changes. No acute intracranial hemorrhage, definite mass lesion, acute infarction, midline shift, herniation, hydrocephalus, or extra-axial fluid collection. No focal mass effect or edema. Cisterns are patent. No cerebellar abnormality. Orbits are symmetric. Mastoids and sinuses are clear. Exam is limited with motion artifact.  IMPRESSION: Remote posterior left MCA territory infarct with encephalomalacia.  Mild brain atrophy and chronic white matter microvascular change.  No acute intracranial finding by noncontrast CT  These results were called by telephone at the time of interpretation on 07/16/2014 at 9:54 pm to Dr. Doy Mince, who verbally acknowledged these results.   Electronically Signed   By: Daryll Brod M.D.   On: 07/16/2014 21:54     Assessment/Plan Principal Problem:   Status epilepticus Active Problems:   Seizure   CVA (cerebral infarction)   Stroke   GI bleed   Gastrostomy tube in place   Dysphasia  Seizure: Patient has breakthrough seizures. CT had has no acute intracranial abnormalities. Neurology was consulted, Dr. Doy Mince evaluated the patient. Per dr. Doy Mince, recently diagnosed infection at [facility] site may have lowered seizure threshold.  patient has a history of stroke, cannot fully rule out the possibility of a new event with current risk factors per Dr. Doy Mince.  -will admit to SDU - Appreciate Dr. Doy Mince consultation -follow Dr. Doy Mince recommendations as follows 1.  Ativan 4mg  IV stat 2.  EEG 3.  Keppra 1000mg   IV now.  Will increase maintenance to 750mg  BID 4.  Vimpat 200mg  IV now.  Will increase maintenance to 200mg  BID 5.  Seizure precautions 6.  Continued antibiotic treatment.    7.  MRI of the brain without contrast  History of pulmonary embolism: On Elliquis at home. No bleeding tendency -Continue Elliquis  Dysphagia: Secondary to previous stroke. Has PEG tube.  -continue home Tube feeding regimen  Infection of PEG tube: She was started with Flagyl, rifampin and doxycycline on 11/28 in SNF -will continue 3 antibiotics to complete her course of treatment (Flagyl for 7 days, rifampin for 5 days, doxycycline for 5 days).   DVT ppx: SQ Heparin   Code Status: Full code Family Communication: None at bed side.  Disposition Plan: Admit to inpatient   Date of Service 07/17/2014    Ivor Costa [facility] Pager [phone]  If 7PM-7AM, please contact night-coverage [email or web address] Password [email or web address] 07/17/2014, 2:55 AM

## 2014-07-16 NOTE — Consult Note (Addendum)
Reason for Consult:Code stroke Referring Physician: Christy Gentles  CC: Status Epilepticus  HPI: Tanya White is an 78 y.o. female SNF resident with a history of stroke, infection on multiple antibiotics and seizures who was at her facility this evening.  While walking the patient at 1730 she was noted to have difficulty with speech and right eye deviation.  At some point EMS was called and the patient was brought in as a code stroke.  EMS did notice some right leg twitching.  This stopped spontaneously.  At arrival the patient as noted to have some facial twitching.  On arrival to the CT scanner patient had clonic movement of the entire right side.  Although awake, speech was unintelligible.  Code stroke cancelled due to patient being in status epilepticus.  Baseline per notes is able to take two steps with full assist, right hemiplegia, dysarthria and some aphasia.  Past Medical History  Diagnosis Date  . Seizures   . Hypotension   . Pneumonia   . Stroke   . GI bleed   . Pulmonary embolism   . Gastrostomy tube in place   . Memory loss     Past Surgical History  Procedure Laterality Date  . Appendectomy    . Jejunostomy feeding tube      Family History  Problem Relation Age of Onset  . High blood pressure    . Cancer    . Stroke      Social History:  reports that she has quit smoking. She has never used smokeless tobacco. She reports that she does not drink alcohol or use illicit drugs.  Allergies  Allergen Reactions  . Aspirin Other (See Comments) and Diarrhea    Only ASA 325mg  ; pt can tolerate low dose asa 81mg    Reaction : GI upset REACTION: unknown  . Codeine Other (See Comments)    Other reaction(s): SHORTNESS OF BREATH REACTION: unknown  . Hydralazine Other (See Comments)    REACTION: unknown  . Lipitor [Atorvastatin] Other (See Comments) and Diarrhea    GI upset, rhabdomyalosis REACTION: unknown  . Reserpine Other (See Comments)    REACTION: unknown  .  Tramadol Other (See Comments)    REACTION: unknown  . Hctz [Hydrochlorothiazide] Other (See Comments) and Rash    REACTION: unknown    Medications: I have reviewed the patient's current medications. Prior to Admission:  Current outpatient prescriptions:  acetaminophen (TYLENOL) 325 MG tablet, 650 mg by PEG Tube route every 6 (six) hours as needed for mild pain or fever., Disp: , Rfl: ;  albuterol (PROVENTIL) (2.5 MG/3ML) 0.083% nebulizer solution, Take 2.5 mg by nebulization every 2 (two) hours as needed for wheezing or shortness of breath., Disp: , Rfl:  Amino Acids-Protein Hydrolys (FEEDING SUPPLEMENT, PRO-STAT SUGAR FREE 64,) LIQD, 30 mLs by PEG Tube route 2 (two) times daily., Disp: , Rfl: ;   Amino Acids-Protein Hydrolys (FEEDING SUPPLEMENT, PRO-STAT SUGAR FREE 64,) LIQD, Place 30 mLs into feeding tube daily at 3 pm., Disp: 900 mL, Rfl: 0;   apixaban (ELIQUIS) 5 MG TABS tablet, 5 mg by PEG Tube route 2 (two) times daily., Disp: , Rfl:  famotidine (PEPCID) 20 MG tablet, 20 mg by PEG Tube route 2 (two) times daily., Disp: , Rfl: ;   ferrous sulfate 325 (65 FE) MG tablet, 325 mg by PEG Tube route daily., Disp: , Rfl: ;   furosemide (LASIX) 40 MG tablet, 40 mg by PEG Tube route daily as needed (for leg swelling.  Give  PRN potassium when Lasix is given)., Disp: , Rfl: ;   Lacosamide (VIMPAT) 150 MG TABS, 150 mg by PEG Tube route 2 (two) times daily., Disp: , Rfl:  levETIRAcetam (KEPPRA) 500 MG tablet, Take 1 tablet (500 mg total) by mouth 2 (two) times daily., Disp: , Rfl: ;   LORazepam (ATIVAN) 0.5 MG tablet, Place 1 tablet (0.5 mg total) into feeding tube every 6 (six) hours as needed (for agitation)., Disp: 15 tablet, Rfl: 0;   magnesium oxide (MAG-OX) 400 MG tablet, 400 mg by PEG Tube route 3 (three) times daily., Disp: , Rfl:  metoprolol tartrate (LOPRESSOR) 12.5 mg TABS tablet, Place 1.5 tablets (37.5 mg total) into feeding tube 2 (two) times daily., Disp: , Rfl: ;  Multiple Vitamin  (MULTIVITAMIN) LIQD, 5 mLs by PEG Tube route daily., Disp: , Rfl: ;   Nutritional Supplements (FEEDING SUPPLEMENT, JEVITY 1.2 CAL,) LIQD, Place 1,000 mLs into feeding tube continuous., Disp: , Rfl: 0 ondansetron (ZOFRAN) 4 MG tablet, 4 mg by PEG Tube route every 4 (four) hours as needed for nausea., Disp: , Rfl: ;   potassium chloride 20 MEQ/15ML (10%) solution, 20 mEq by PEG Tube route daily as needed (Adminiter with PRN Lasix.)., Disp: , Rfl: ;  PRESCRIPTION MEDICATION, Apply 1 mg topically every 6 (six) hours as needed (ATIVAN GEL for agitation)., Disp: , Rfl:  Water For Irrigation, Sterile (FREE WATER) SOLN, Place 150 mLs into feeding tube every 6 (six) hours., Disp: , Rfl:   ROS: Unable to obtain due to mental status  Physical Examination: Blood pressure 103/55, pulse 108, resp. rate 18, SpO2 94 %.  HEENT-  Normocephalic, no lesions, without obvious abnormality.  Normal external eye and conjunctiva.  Normal TM's bilaterally.  Normal auditory canals and external ears. Normal external nose, mucus membranes and septum.  Normal pharynx.  Scar across forehead. Cardiovascular- S1, S2 normal, pulses palpable throughout   Lungs- chest clear, no wheezing, rales, normal symmetric air entry Abdomen- soft, non-tender; bowel sounds normal; no masses,  no organomegaly, PEG tuber in place Extremities- no edema Lymph-no adenopathy palpable Musculoskeletal-no joint tenderness, deformity or swelling Skin-warm and dry, no hyperpigmentation, vitiligo, or suspicious lesions  Neurologic Examination Mental Status: Alert, expressive aphasia.  No speech intelligible.  Does not follow commands.   Cranial Nerves: II: Discs flat bilaterally; Pupils equal, round, reactive to light and accommodation.   III,IV, VI: ptosis not present, intact oculocephalic maneuvers bilaterally.  No gaze deviation. V,VII: smile symmetric, facial light touch sensation normal bilaterally.  Right clonic activity of the face VIII:  hearing unable to be tested IX,X: gag reflex present XI: shoulder shrug unable to be tested XII: tongue extension unable to be tested Motor: Patient moves left upper and lower extremities spontaneously and against gravity.  Continuous clonic activity noted of the Right upper and lower extremities. Sensory: Does not respond to noxious stimuli Deep Tendon Reflexes: 2+ in the upper extremities, 1+ at the knees and absent at the ankles.   Plantars: Right: upgoing   Left: downgoing Cerebellar: Unable to perform secondary to mental status Gait: Unable to perform secondary to seizure activity   Laboratory Studies:   Basic Metabolic Panel:  Recent Labs Lab 07/16/14 2130 07/16/14 2143  NA 137 139  K 4.0 3.9  CL 101 103  CO2 24  --   GLUCOSE 140* 147*  BUN 24* 25*  CREATININE 0.59 0.60  CALCIUM 8.4  --     Liver Function Tests:  Recent Labs Lab 07/16/14 2130  AST 12  ALT 7  ALKPHOS 104  BILITOT 0.2*  PROT 6.9  ALBUMIN 3.0*   No results for input(s): LIPASE, AMYLASE in the last 168 hours. No results for input(s): AMMONIA in the last 168 hours.  CBC:  Recent Labs Lab 07/16/14 2130 07/16/14 2143  WBC 9.0  --   NEUTROABS 7.3  --   HGB 11.4* 12.9  HCT 32.9* 38.0  MCV 81.6  --   PLT 287  --     Cardiac Enzymes: No results for input(s): CKTOTAL, CKMB, CKMBINDEX, TROPONINI in the last 168 hours.  BNP: Invalid input(s): POCBNP  CBG:  Recent Labs Lab 07/16/14 2133  GLUCAP 138*    Microbiology: Results for orders placed or performed during the hospital encounter of 05/13/14  Culture, blood (routine x 2) Call MD if unable to obtain prior to antibiotics being given     Status: None   Collection Time: 05/13/14  9:14 PM  Result Value Ref Range Status   Specimen Description BLOOD RIGHT HAND  Final   Special Requests BOTTLES DRAWN AEROBIC ONLY 4CC  Final   Culture  Setup Time   Final    05/14/2014 00:29 Performed at [facility]   Final     NO GROWTH 5 DAYS Performed at [facility]   Report Status 05/20/2014 FINAL  Final  Culture, blood (routine x 2) Call MD if unable to obtain prior to antibiotics being given     Status: None   Collection Time: 05/13/14  9:27 PM  Result Value Ref Range Status   Specimen Description BLOOD LEFT ARM  Final   Special Requests BOTTLES DRAWN AEROBIC ONLY 3CC  Final   Culture  Setup Time   Final    05/14/2014 00:30 Performed at [place]   Final    NO GROWTH 5 DAYS Performed at [facility]   Report Status 05/20/2014 FINAL  Final  MRSA PCR Screening     Status: None   Collection Time: 05/13/14 10:05 PM  Result Value Ref Range Status   MRSA by PCR NEGATIVE NEGATIVE Final    Comment:        The GeneXpert MRSA Assay (FDA approved for NASAL specimens only), is one component of a comprehensive MRSA colonization surveillance program. It is not intended to diagnose MRSA infection nor to guide or monitor treatment for MRSA infections.  Stat Gram stain     Status: None   Collection Time: 05/17/14  9:42 AM  Result Value Ref Range Status   Specimen Description PLEURAL FLUID LEFT  Final   Special Requests 60ML FLUID  Final   Gram Stain   Final    CYTOSPIN PREP WBC PRESENT,BOTH PMN AND MONONUCLEAR NO ORGANISMS SEEN   Report Status 05/17/2014 FINAL  Final  Body fluid culture     Status: None   Collection Time: 05/17/14  9:42 AM  Result Value Ref Range Status   Specimen Description PLEURAL FLUID LEFT  Final   Special Requests 60ML FLUID  Final   Gram Stain   Final    CYTOSPIN WBC PRESENT,BOTH PMN AND MONONUCLEAR NO ORGANISMS SEEN Performed at [facility] Performed at [facility]   Culture   Final    NO GROWTH 3 DAYS Performed at [facility]   Report Status 05/20/2014 FINAL  Final  Clostridium Difficile by PCR     Status: None   Collection Time: 05/18/14 11:25 AM  Result Value Ref Range Status   C difficile by pcr NEGATIVE  NEGATIVE Final    Coagulation Studies:  Recent Labs  07/16/14 2130  LABPROT 18.7*  INR 1.55*    Urinalysis: No results for input(s): COLORURINE, LABSPEC, PHURINE, GLUCOSEU, HGBUR, BILIRUBINUR, KETONESUR, PROTEINUR, UROBILINOGEN, NITRITE, LEUKOCYTESUR in the last 168 hours.  Invalid input(s): APPERANCEUR  Lipid Panel:  No results found for: CHOL, TRIG, HDL, CHOLHDL, VLDL, LDLCALC  HgbA1C: No results found for: HGBA1C  Urine Drug Screen:      Component Value Date/Time   LABOPIA NONE DETECTED 05/13/2014 1441   COCAINSCRNUR NONE DETECTED 05/13/2014 1441   LABBENZ NONE DETECTED 05/13/2014 1441   AMPHETMU NONE DETECTED 05/13/2014 1441   THCU NONE DETECTED 05/13/2014 1441   LABBARB NONE DETECTED 05/13/2014 1441    Alcohol Level:   Recent Labs Lab 07/16/14 2130  ETH <11    Other results: EKG: sinus tachycardia.  Imaging: Ct Head Wo Contrast  07/16/2014   CLINICAL DATA:  Right-sided weakness, code stroke  EXAM: CT HEAD WITHOUT CONTRAST  TECHNIQUE: Contiguous axial images were obtained from the base of the skull through the vertex without contrast.  COMPARISON:  05/13/2014  FINDINGS: Posterior left parietal encephalomalacia again evident from a remote left MCA territory infarct. Diffuse brain atrophy also evident with periventricular white matter microvascular ischemic changes. No acute intracranial hemorrhage, definite mass lesion, acute infarction, midline shift, herniation, hydrocephalus, or extra-axial fluid collection. No focal mass effect or edema. Cisterns are patent. No cerebellar abnormality. Orbits are symmetric. Mastoids and sinuses are clear. Exam is limited with motion artifact.  IMPRESSION: Remote posterior left MCA territory infarct with encephalomalacia.  Mild brain atrophy and chronic white matter microvascular change.  No acute intracranial finding by noncontrast CT  These results were called by telephone at the time of interpretation on 07/16/2014 at 9:54 pm to Dr.  Doy Mince, who verbally acknowledged these results.   Electronically Signed   By: Daryll Brod M.D.   On: 07/16/2014 21:54     Assessment/Plan: 78 year old female with a history of seizures and old infarcts on Apixaban for PE and atrial thrombus who presents with seizure activity.  Initially felt to be a code stroke but in status epilepticus on presentation.    Patient with altered mental status and right clonic activity.  Recently diagnosed infection at PEG site.  This may be lowering seizure threshold.  On multiple antibiotics.  On Keppra and Vimpat as well.  Has been on Dilantin and Depakote in the past with side effects.   Head CT personally reviewed and shows no acute changes but there is evidence of a chronic left MCA territory infarct.  Can not fully rule out the possibility of a new event with current risk factors.  Recommendations: 1.  Ativan 4mg  IV stat 2.  EEG 3.  Keppra 1000mg  IV now.  Will increase maintenance to 750mg  BID 4.  Vimpat 200mg  IV now.  Will increase maintenance to 200mg  BID 5.  Seizure precautions 6.  Continued antibiotic treatment.   7.  MRI of the brain without contrast  This patient is critically ill and at significant risk of neurological worsening, death and care requires constant monitoring of vital signs, hemodynamics,respiratory and cardiac monitoring, neurological assessment, discussion with family, other specialists and medical decision making of high complexity. I spent 80 minutes of neurocritical care time  in the care of  this patient.  Alexis Goodell, MD [facility] [phone] 07/16/2014  10:27 PM

## 2014-07-16 NOTE — ED Notes (Signed)
CBG 138

## 2014-07-16 NOTE — Code Documentation (Signed)
Tanya White is a 78yo bf brought in via [facility] for garbled speech while walking with [facility] staff at [place].  Per report the pt walks with assist and communicates verbally with staff, but has a Rt side deficit from an old hemorrhagic stroke. On arrival to the ED the pt was alert with garbled speech & Rt foot twitching.  Pt began to have twitching in her arm & face on the way to CT and the decision was made by the neurologist to hold on the CT to control the now clear seizure prior to scan.  Pt was taken to an ED room & Ativan was given.  Pt was then transported to CT scan once the seizure was ceased.  Code stroke cancelled.

## 2014-07-16 NOTE — ED Provider Notes (Signed)
Pt seen on arrival with PA Harris for concern for CVA Pt is also noted to have seizure activity Neurology has seen patient and is at bedside.  She has  Been given ativan CT head pending at this time   [name], MD 07/16/14 2141

## 2014-07-16 NOTE — ED Notes (Signed)
Pt comes [facility] via [facility], staff states she had acute onset on LOC, slurred speech, staff stated right sided gaze, Last know well 1930.

## 2014-07-16 NOTE — ED Provider Notes (Signed)
CSN: [id number]     Arrival date & time 07/16/14  2110 History   First MD Initiated Contact with Patient 07/16/14 2119     Chief Complaint  Patient presents with  . Code Stroke    An emergency department physician performed an initial assessment on this suspected stroke patient at 2135. (Consider location/radiation/quality/duration/timing/severity/associated sxs/prior Treatment) HPI  Tanya White is a 78 y.o. Female Who is sent from [facility] nursing home after sudden onset garblesd speech and Right gaze preference at 19:30. This resolved for EMS. A code stroke was called and she was met By Dr. Alexis Goodell where she had onset of a focal right sided seizure involving the arm and leg. Patient was given 4 mg Iv Ativan with resolution. She has a known history of seizure disorder on multiple seizure medications (vimpat, keppra), history of left MCA CVA, dysphagia with PEG tube was recently admitted at [facility], discharged on 9/14 when she was admitted with status epilepticus. During the hospitalization, patient's seizure medications were uptitrated. She was found to have dysphagia and failed swallow screen- PEG tube was placed on 9/4. CTA chest on 9/3 showed a 2 small right lower lobe pulmonary embolus, echocardiogram showed small thrombus in the right atrium- patient was placed on Eliquis, .. She is more alert and talkative. She has completed the course of antibiotics for possible HCAP.   Past Medical History  Diagnosis Date  . Seizures   . Hypotension   . Pneumonia   . Stroke   . GI bleed   . Pulmonary embolism   . Gastrostomy tube in place   . Memory loss    Past Surgical History  Procedure Laterality Date  . Appendectomy    . Jejunostomy feeding tube     Family History  Problem Relation Age of Onset  . High blood pressure    . Cancer    . Stroke     History  Substance Use Topics  . Smoking status: Former Smoker -- 0.50 packs/day for 15 years  . Smokeless  tobacco: Never Used  . Alcohol Use: No   OB History    No data available     Review of Systems  Unable to perform ROS     Allergies  Aspirin; Codeine; Hydralazine; Lipitor; Reserpine; Tramadol; and Hctz  Home Medications   Prior to Admission medications   Medication Sig Start Date End Date Taking? Authorizing Provider  acetaminophen (TYLENOL) 325 MG tablet 650 mg by PEG Tube route every 6 (six) hours as needed for mild pain or fever.    Historical Provider, MD  albuterol (PROVENTIL) (2.5 MG/3ML) 0.083% nebulizer solution Take 2.5 mg by nebulization every 2 (two) hours as needed for wheezing or shortness of breath.    Historical Provider, MD  Amino Acids-Protein Hydrolys (FEEDING SUPPLEMENT, PRO-STAT SUGAR FREE 64,) LIQD 30 mLs by PEG Tube route 2 (two) times daily.    Historical Provider, MD  Amino Acids-Protein Hydrolys (FEEDING SUPPLEMENT, PRO-STAT SUGAR FREE 64,) LIQD Place 30 mLs into feeding tube daily at 3 pm. 05/20/14   Hosie Poisson, MD  apixaban (ELIQUIS) 5 MG TABS tablet 5 mg by PEG Tube route 2 (two) times daily.    Historical Provider, MD  famotidine (PEPCID) 20 MG tablet 20 mg by PEG Tube route 2 (two) times daily.    Historical Provider, MD  ferrous sulfate 325 (65 FE) MG tablet 325 mg by PEG Tube route daily.    Historical Provider, MD  furosemide (LASIX) 40  MG tablet 40 mg by PEG Tube route daily as needed (for leg swelling.  Give PRN potassium when Lasix is given).    Historical Provider, MD  Lacosamide (VIMPAT) 150 MG TABS 150 mg by PEG Tube route 2 (two) times daily.    Historical Provider, MD  levETIRAcetam (KEPPRA) 500 MG tablet Take 1 tablet (500 mg total) by mouth 2 (two) times daily. 05/20/14   Hosie Poisson, MD  LORazepam (ATIVAN) 0.5 MG tablet Place 1 tablet (0.5 mg total) into feeding tube every 6 (six) hours as needed (for agitation). 05/20/14   Hosie Poisson, MD  magnesium oxide (MAG-OX) 400 MG tablet 400 mg by PEG Tube route 3 (three) times daily.    Historical  Provider, MD  metoprolol tartrate (LOPRESSOR) 12.5 mg TABS tablet Place 1.5 tablets (37.5 mg total) into feeding tube 2 (two) times daily. 05/20/14   Hosie Poisson, MD  Multiple Vitamin (MULTIVITAMIN) LIQD 5 mLs by PEG Tube route daily.    Historical Provider, MD  Nutritional Supplements (FEEDING SUPPLEMENT, JEVITY 1.2 CAL,) LIQD Place 1,000 mLs into feeding tube continuous. 05/20/14   Hosie Poisson, MD  ondansetron (ZOFRAN) 4 MG tablet 4 mg by PEG Tube route every 4 (four) hours as needed for nausea.    Historical Provider, MD  potassium chloride 20 MEQ/15ML (10%) solution 20 mEq by PEG Tube route daily as needed (Adminiter with PRN Lasix.).    Historical Provider, MD  PRESCRIPTION MEDICATION Apply 1 mg topically every 6 (six) hours as needed (ATIVAN GEL for agitation).    Historical Provider, MD  Water For Irrigation, Sterile (FREE WATER) SOLN Place 150 mLs into feeding tube every 6 (six) hours. 05/20/14   Hosie Poisson, MD   BP 103/55 mmHg  Pulse 108  Resp 18  SpO2 94% Physical Exam  Constitutional: She appears well-developed and well-nourished. No distress.  HENT:  Head: Normocephalic and atraumatic.  Eyes: Conjunctivae are normal. No scleral icterus.  Neck: Normal range of motion.  Cardiovascular: Normal rate, regular rhythm and normal heart sounds.  Exam reveals no gallop and no friction rub.   No murmur heard. Pulmonary/Chest: Effort normal and breath sounds normal. No respiratory distress.  Abdominal: Soft. Bowel sounds are normal. She exhibits no distension and no mass. There is no tenderness. There is no guarding.  Neurological: She is alert.  Tonic clonic mvmts involving the R leg and arm.  Skin: Skin is warm and dry. She is not diaphoretic.  Nursing note and vitals reviewed.   ED Course  Procedures (including critical care time) Labs Review Labs Reviewed  PROTIME-INR - Abnormal; Notable for the following:    Prothrombin Time 18.7 (*)    INR 1.55 (*)    All other components  within normal limits  APTT - Abnormal; Notable for the following:    aPTT 39 (*)    All other components within normal limits  CBC - Abnormal; Notable for the following:    Hemoglobin 11.4 (*)    HCT 32.9 (*)    All other components within normal limits  DIFFERENTIAL - Abnormal; Notable for the following:    Neutrophils Relative % 82 (*)    Lymphocytes Relative 9 (*)    All other components within normal limits  I-STAT CHEM 8, ED - Abnormal; Notable for the following:    BUN 25 (*)    Glucose, Bld 147 (*)    All other components within normal limits  CBG MONITORING, ED - Abnormal; Notable for the following:  Glucose-Capillary 138 (*)    All other components within normal limits  ETHANOL  COMPREHENSIVE METABOLIC PANEL  URINE RAPID DRUG SCREEN (HOSP PERFORMED)  URINALYSIS, ROUTINE W REFLEX MICROSCOPIC  I-STAT TROPOININ, ED  I-STAT TROPOININ, ED    Imaging Review Ct Head Wo Contrast  07/16/2014   CLINICAL DATA:  Right-sided weakness, code stroke  EXAM: CT HEAD WITHOUT CONTRAST  TECHNIQUE: Contiguous axial images were obtained from the base of the skull through the vertex without contrast.  COMPARISON:  05/13/2014  FINDINGS: Posterior left parietal encephalomalacia again evident from a remote left MCA territory infarct. Diffuse brain atrophy also evident with periventricular white matter microvascular ischemic changes. No acute intracranial hemorrhage, definite mass lesion, acute infarction, midline shift, herniation, hydrocephalus, or extra-axial fluid collection. No focal mass effect or edema. Cisterns are patent. No cerebellar abnormality. Orbits are symmetric. Mastoids and sinuses are clear. Exam is limited with motion artifact.  IMPRESSION: Remote posterior left MCA territory infarct with encephalomalacia.  Mild brain atrophy and chronic white matter microvascular change.  No acute intracranial finding by noncontrast CT  These results were called by telephone at the time of  interpretation on 07/16/2014 at 9:54 pm to Dr. Doy Mince, who verbally acknowledged these results.   Electronically Signed   By: Daryll Brod M.D.   On: 07/16/2014 21:54     EKG Interpretation   Date/Time:  Tuesday July 16 2014 21:24:29 EST Ventricular Rate:  122 PR Interval:  132 QRS Duration: 95 QT Interval:  334 QTC Calculation: 476 R Axis:   -64 Text Interpretation:  Sinus tachycardia LAE, consider biatrial enlargement  LAD, consider left anterior fascicular block Nonspecific T abnrm,  anterolateral leads Abnormal ekg Confirmed by Christy Gentles  MD, Elenore Rota ([id number])  on 07/16/2014 9:39:26 PM      MDM   Final diagnoses:  Seizure  Status epilepticus    78 year old female in status epilepticus. Patient did not return to baseline while in the emergency department. Seen in shared visit with Dr. Alexis Goodell. Patient admitted to stepdown unit. Labs show anemia, , EKG unchanged without signs of acute ischemia. Head CT without findings of acute stroke. Chest x-ray shows nodular opacity which is known and currently being worked up. PT, INR and APTT are slightly elevated. Pt stable in ED with no significant deterioration in condition.     Margarita Mail, PA-C 07/18/14 0901  [name], MD 07/19/14 Laureen Abrahams

## 2014-07-17 ENCOUNTER — Inpatient Hospital Stay (HOSPITAL_COMMUNITY): Payer: PRIVATE HEALTH INSURANCE

## 2014-07-17 DIAGNOSIS — G40301 Generalized idiopathic epilepsy and epileptic syndromes, not intractable, with status epilepticus: Secondary | ICD-10-CM

## 2014-07-17 LAB — URINALYSIS, ROUTINE W REFLEX MICROSCOPIC
Bilirubin Urine: NEGATIVE
GLUCOSE, UA: NEGATIVE mg/dL
KETONES UR: NEGATIVE mg/dL
Nitrite: NEGATIVE
Protein, ur: NEGATIVE mg/dL
Specific Gravity, Urine: 1.009 (ref 1.005–1.030)
Urobilinogen, UA: 0.2 mg/dL (ref 0.0–1.0)
pH: 7 (ref 5.0–8.0)

## 2014-07-17 LAB — COMPREHENSIVE METABOLIC PANEL
ALBUMIN: 2.8 g/dL — AB (ref 3.5–5.2)
ALT: 7 U/L (ref 0–35)
ANION GAP: 10 (ref 5–15)
AST: 13 U/L (ref 0–37)
Alkaline Phosphatase: 94 U/L (ref 39–117)
BUN: 17 mg/dL (ref 6–23)
CO2: 22 mEq/L (ref 19–32)
Calcium: 8.2 mg/dL — ABNORMAL LOW (ref 8.4–10.5)
Chloride: 110 mEq/L (ref 96–112)
Creatinine, Ser: 0.64 mg/dL (ref 0.50–1.10)
GFR calc non Af Amer: 85 mL/min — ABNORMAL LOW (ref >90)
Glucose, Bld: 94 mg/dL (ref 70–99)
Potassium: 4.6 mEq/L (ref 3.7–5.3)
Sodium: 142 mEq/L (ref 137–147)
TOTAL PROTEIN: 6.2 g/dL (ref 6.0–8.3)
Total Bilirubin: <0.2 mg/dL — ABNORMAL LOW (ref 0.3–1.2)

## 2014-07-17 LAB — CBC
HCT: 31.6 % — ABNORMAL LOW (ref 36.0–46.0)
HEMOGLOBIN: 10.9 g/dL — AB (ref 12.0–15.0)
MCH: 28.2 pg (ref 26.0–34.0)
MCHC: 34.5 g/dL (ref 30.0–36.0)
MCV: 81.9 fL (ref 78.0–100.0)
Platelets: 278 10*3/uL (ref 150–400)
RBC: 3.86 MIL/uL — AB (ref 3.87–5.11)
RDW: 15.2 % (ref 11.5–15.5)
WBC: 7.4 10*3/uL (ref 4.0–10.5)

## 2014-07-17 LAB — GLUCOSE, CAPILLARY
GLUCOSE-CAPILLARY: 67 mg/dL — AB (ref 70–99)
GLUCOSE-CAPILLARY: 80 mg/dL (ref 70–99)
Glucose-Capillary: 88 mg/dL (ref 70–99)
Glucose-Capillary: 58 mg/dL — ABNORMAL LOW (ref 70–99)
Glucose-Capillary: 81 mg/dL (ref 70–99)
Glucose-Capillary: 86 mg/dL (ref 70–99)

## 2014-07-17 LAB — URINE MICROSCOPIC-ADD ON

## 2014-07-17 LAB — PROTIME-INR
INR: 1.38 (ref 0.00–1.49)
PROTHROMBIN TIME: 17.1 s — AB (ref 11.6–15.2)

## 2014-07-17 LAB — RAPID URINE DRUG SCREEN, HOSP PERFORMED
Amphetamines: NOT DETECTED
BENZODIAZEPINES: NOT DETECTED
Barbiturates: NOT DETECTED
Cocaine: NOT DETECTED
Opiates: POSITIVE — AB
Tetrahydrocannabinol: NOT DETECTED

## 2014-07-17 LAB — MRSA PCR SCREENING: MRSA BY PCR: POSITIVE — AB

## 2014-07-17 MED ORDER — DOXYCYCLINE HYCLATE 100 MG PO TABS
100.0000 mg | ORAL_TABLET | Freq: Two times a day (BID) | ORAL | Status: AC
  Administered 2014-07-17: 100 mg via ORAL
  Filled 2014-07-17: qty 1

## 2014-07-17 MED ORDER — DOXYCYCLINE HYCLATE 100 MG PO TABS
100.0000 mg | ORAL_TABLET | Freq: Two times a day (BID) | ORAL | Status: DC
  Administered 2014-07-17: 100 mg via ORAL
  Filled 2014-07-17 (×2): qty 1

## 2014-07-17 MED ORDER — METRONIDAZOLE 500 MG PO TABS
500.0000 mg | ORAL_TABLET | Freq: Two times a day (BID) | ORAL | Status: AC
  Administered 2014-07-17 – 2014-07-19 (×4): 500 mg via ORAL
  Filled 2014-07-17 (×6): qty 1

## 2014-07-17 MED ORDER — JEVITY 1.2 CAL PO LIQD
1000.0000 mL | ORAL | Status: DC
  Filled 2014-07-17 (×2): qty 1000

## 2014-07-17 MED ORDER — RIFAMPIN 300 MG PO CAPS
300.0000 mg | ORAL_CAPSULE | Freq: Two times a day (BID) | ORAL | Status: AC
  Administered 2014-07-17: 300 mg via ORAL
  Filled 2014-07-17: qty 1

## 2014-07-17 MED ORDER — MAGNESIUM OXIDE 400 (241.3 MG) MG PO TABS
400.0000 mg | ORAL_TABLET | Freq: Three times a day (TID) | ORAL | Status: DC
  Administered 2014-07-17 – 2014-07-19 (×8): 400 mg
  Filled 2014-07-17 (×12): qty 1

## 2014-07-17 MED ORDER — ALBUTEROL SULFATE (2.5 MG/3ML) 0.083% IN NEBU: 2.5000 mg | INHALATION_SOLUTION | RESPIRATORY_TRACT | Status: DC | PRN

## 2014-07-17 MED ORDER — LACOSAMIDE 200 MG PO TABS
200.0000 mg | ORAL_TABLET | Freq: Two times a day (BID) | ORAL | Status: DC
  Administered 2014-07-17: 200 mg
  Filled 2014-07-17: qty 4

## 2014-07-17 MED ORDER — LACOSAMIDE 50 MG PO TABS
200.0000 mg | ORAL_TABLET | Freq: Two times a day (BID) | ORAL | Status: DC
  Administered 2014-07-17 – 2014-07-19 (×4): 200 mg
  Filled 2014-07-17: qty 4
  Filled 2014-07-17: qty 1
  Filled 2014-07-17: qty 4
  Filled 2014-07-17: qty 1
  Filled 2014-07-17 (×2): qty 4

## 2014-07-17 MED ORDER — SODIUM CHLORIDE 0.9 % IV SOLN
INTRAVENOUS | Status: DC
  Administered 2014-07-17: 75 mL/h via INTRAVENOUS
  Administered 2014-07-17: 05:00:00 via INTRAVENOUS

## 2014-07-17 MED ORDER — LORAZEPAM 0.5 MG PO TABS
0.5000 mg | ORAL_TABLET | Freq: Four times a day (QID) | ORAL | Status: DC | PRN
  Administered 2014-07-18: 0.5 mg
  Filled 2014-07-17: qty 1

## 2014-07-17 MED ORDER — METRONIDAZOLE 500 MG PO TABS
500.0000 mg | ORAL_TABLET | Freq: Two times a day (BID) | ORAL | Status: DC
  Administered 2014-07-17: 500 mg via ORAL
  Filled 2014-07-17 (×2): qty 1

## 2014-07-17 MED ORDER — PRO-STAT SUGAR FREE PO LIQD
30.0000 mL | Freq: Every day | ORAL | Status: DC
  Filled 2014-07-17: qty 30

## 2014-07-17 MED ORDER — LEVETIRACETAM 750 MG PO TABS
750.0000 mg | ORAL_TABLET | Freq: Two times a day (BID) | ORAL | Status: DC
  Administered 2014-07-17 – 2014-07-21 (×8): 750 mg via ORAL
  Filled 2014-07-17 (×11): qty 1

## 2014-07-17 MED ORDER — METOPROLOL TARTRATE 25 MG PO TABS
37.5000 mg | ORAL_TABLET | Freq: Two times a day (BID) | ORAL | Status: DC
  Administered 2014-07-17 – 2014-07-19 (×4): 37.5 mg
  Filled 2014-07-17 (×10): qty 1

## 2014-07-17 MED ORDER — ONDANSETRON HCL 4 MG PO TABS: 4.0000 mg | ORAL_TABLET | ORAL | Status: DC | PRN

## 2014-07-17 MED ORDER — RIFAMPIN 300 MG PO CAPS
300.0000 mg | ORAL_CAPSULE | Freq: Two times a day (BID) | ORAL | Status: DC
  Administered 2014-07-17: 300 mg via ORAL
  Filled 2014-07-17 (×2): qty 1

## 2014-07-17 MED ORDER — DEXTROSE 50 % IV SOLN
INTRAVENOUS | Status: AC
  Administered 2014-07-17: 25 mL
  Filled 2014-07-17: qty 50

## 2014-07-17 MED ORDER — FREE WATER
150.0000 mL | Freq: Four times a day (QID) | Status: DC
  Administered 2014-07-17 – 2014-07-19 (×9): 150 mL

## 2014-07-17 MED ORDER — PRO-STAT SUGAR FREE PO LIQD
30.0000 mL | Freq: Two times a day (BID) | ORAL | Status: DC
  Administered 2014-07-17: 30 mL
  Filled 2014-07-17 (×2): qty 30

## 2014-07-17 MED ORDER — HEPARIN SODIUM (PORCINE) 5000 UNIT/ML IJ SOLN: 5000.0000 [IU] | Freq: Three times a day (TID) | INTRAMUSCULAR | Status: DC

## 2014-07-17 MED ORDER — APIXABAN 5 MG PO TABS
5.0000 mg | ORAL_TABLET | Freq: Two times a day (BID) | ORAL | Status: DC
  Administered 2014-07-17 – 2014-07-19 (×5): 5 mg
  Filled 2014-07-17 (×8): qty 1

## 2014-07-17 MED ORDER — ADULT MULTIVITAMIN LIQUID CH
5.0000 mL | Freq: Every day | ORAL | Status: DC
  Administered 2014-07-17 – 2014-07-19 (×3): 5 mL
  Filled 2014-07-17 (×4): qty 5

## 2014-07-17 MED ORDER — CETYLPYRIDINIUM CHLORIDE 0.05 % MT LIQD
7.0000 mL | Freq: Two times a day (BID) | OROMUCOSAL | Status: DC
  Administered 2014-07-17 – 2014-07-22 (×9): 7 mL via OROMUCOSAL

## 2014-07-17 MED ORDER — ACETAMINOPHEN 325 MG PO TABS: 650.0000 mg | ORAL_TABLET | Freq: Four times a day (QID) | ORAL | Status: DC | PRN

## 2014-07-17 MED ORDER — CHLORHEXIDINE GLUCONATE 0.12 % MT SOLN
15.0000 mL | Freq: Two times a day (BID) | OROMUCOSAL | Status: DC
  Administered 2014-07-17 – 2014-07-22 (×11): 15 mL via OROMUCOSAL
  Filled 2014-07-17 (×11): qty 15

## 2014-07-17 MED ORDER — SODIUM CHLORIDE 0.9 % IJ SOLN
3.0000 mL | Freq: Two times a day (BID) | INTRAMUSCULAR | Status: DC
  Administered 2014-07-17: 3 mL via INTRAVENOUS
  Administered 2014-07-18: 10 mL via INTRAVENOUS

## 2014-07-17 MED ORDER — FAMOTIDINE 40 MG/5ML PO SUSR
20.0000 mg | Freq: Two times a day (BID) | ORAL | Status: DC
  Administered 2014-07-17 – 2014-07-22 (×10): 20 mg via ORAL
  Filled 2014-07-17 (×13): qty 2.5

## 2014-07-17 MED ORDER — METOPROLOL TARTRATE 25 MG PO TABS
37.5000 mg | ORAL_TABLET | Freq: Two times a day (BID) | ORAL | Status: DC
  Filled 2014-07-17 (×2): qty 1

## 2014-07-17 NOTE — Progress Notes (Signed)
Utilization Review Completed.  

## 2014-07-17 NOTE — Plan of Care (Signed)
Problem: Phase I Progression Outcomes Goal: Maintaining airway and VS stable Outcome: Completed/Met Date Met:  07/17/14

## 2014-07-17 NOTE — Progress Notes (Signed)
SLP Cancellation Note  Patient Details Name: Tanya White MRN: [id number] DOB: 07/04/1936   Cancelled treatment:       Reason Eval/Treat Not Completed: Patient at procedure or test/unavailable.     Germain Osgood, M.A. CCC-SLP [phone]  Germain Osgood 07/17/2014, 2:23 PM

## 2014-07-17 NOTE — Procedures (Signed)
History: 78 yo F with seizure  Sedation: None   Technique: This is a 17 channel routine scalp EEG performed at the bedside with bipolar and monopolar montages arranged in accordance to the international 10/20 system of electrode placement. One channel was dedicated to EKG recording.    Background: There is a poorly sustained posterior dominant rhythm during a brief period of awakening of 8.5 Hz. The predominance otf this EEG is recorded during sleep with bilateral sleep spindles seen. There is generalized slow activity even during maximal wakefulness.    Photic stimulation: Physiologic driving is not performed  EEG Abnormalities: 1) Generalized slow activity  Clinical Interpretation: This EEG is most consistent with a mild generalized encephalopathy. There was no seizure or seizure predisposition recorded on this study.   Roland Rack, MD [facility] [phone]  If 7pm- 7am, please page neurology on call as listed in [place].

## 2014-07-17 NOTE — Plan of Care (Signed)
Problem: Phase I Progression Outcomes Goal: IV access obtained Outcome: Completed/Met Date Met:  07/17/14

## 2014-07-17 NOTE — Plan of Care (Signed)
Problem: Consults Goal: Nutrition Consult-if indicated Outcome: Completed/Met Date Met:  07/17/14     

## 2014-07-17 NOTE — Plan of Care (Signed)
Problem: Phase I Progression Outcomes Goal: Voiding-avoid urinary catheter unless indicated Outcome: Not Met (add Reason) Patient confused and unable to verbalize need to go to the bathroom, thus resulting in multiple incontinent episodes. Foley placed per Dr. Eliseo Squires order.

## 2014-07-17 NOTE — Plan of Care (Signed)
Problem: Phase I Progression Outcomes Goal: Maintaining airway and VS stable Outcome: Progressing

## 2014-07-17 NOTE — Progress Notes (Signed)
Subjective:  History: Tanya White is an 78 y.o. female SNF resident with a history of stroke, infection on multiple antibiotics and seizures who was at her facility this evening. While walking the patient at 1730 she was noted to have difficulty with speech and right eye deviation. At some point EMS was called and the patient was brought in as a code stroke. EMS did notice some right leg twitching. This stopped spontaneously. At arrival the patient as noted to have some facial twitching. On arrival to the CT scanner patient had clonic movement of the entire right side. Although awake, speech was unintelligible. Code stroke cancelled due to patient being in status epilepticus.  Baseline per notes is able to take two steps with full assist, right hemiplegia, dysarthria and some aphasia.  CT head shows remote posterior left MCA infarct with encephalomalacia  Objective: Current vital signs: BP 94/54 mmHg  Pulse 70  Temp(Src) 97.6 F (36.4 C) (Oral)  Resp 15  SpO2 100% Vital signs in last 24 hours: Temp:  [97.6 F (36.4 C)] 97.6 F (36.4 C) (12/02 0804) Pulse Rate:  [70-113] 70 (12/02 0530) Resp:  [13-24] 15 (12/02 0700) BP: (92-155)/(43-71) 94/54 mmHg (12/02 0700) SpO2:  [93 %-100 %] 100 % (12/02 0700)  Intake/Output from previous day: 12/01 0701 - 12/02 0700 In: 150 [I.V.:150] Out: -  Intake/Output this shift: Total I/O In: 75 [I.V.:75] Out: -  Nutritional status: Diet NPO time specified  Neurologic Exam: Neurologic Examination Mental Status: Alert, expressive and receptive aphasia. Does not follow commands.  Cranial Nerves: II: Pupils equal, round, reactive to light  III,IV, VI: ptosis not present, intact oculocephalic maneuvers bilaterally. No gaze deviation. V,VII: smile symmetric, facial light touch sensation normal bilaterally. Right clonic activity of the face VIII: hearing unable to be tested IX,X: gag reflex present XI: shoulder shrug unable to be  tested XII: tongue extension unable to be tested Motor: Patient moves left upper and lower extremities spontaneously and against gravity. No spontaneous movement noted of RUE, minimal movement RLE Sensory: intact to LT all extremities Deep Tendon Reflexes: 2+ in the upper extremities, 1+ at the knees and absent at the ankles.  Plantars: Right: upgoingLeft: downgoing Cerebellar: Unable to perform secondary to mental status Gait: Unable to perform secondary to multiple leads in ICU  Lab Results: Basic Metabolic Panel:  Recent Labs Lab 07/16/14 2130 07/16/14 2143 07/17/14 0430  NA 137 139 142  K 4.0 3.9 4.6  CL 101 103 110  CO2 24  --  22  GLUCOSE 140* 147* 94  BUN 24* 25* 17  CREATININE 0.59 0.60 0.64  CALCIUM 8.4  --  8.2*    Liver Function Tests:  Recent Labs Lab 07/16/14 2130 07/17/14 0430  AST 12 13  ALT 7 7  ALKPHOS 104 94  BILITOT 0.2* <0.2*  PROT 6.9 6.2  ALBUMIN 3.0* 2.8*   No results for input(s): LIPASE, AMYLASE in the last 168 hours. No results for input(s): AMMONIA in the last 168 hours.  CBC:  Recent Labs Lab 07/16/14 2130 07/16/14 2143 07/17/14 0430  WBC 9.0  --  7.4  NEUTROABS 7.3  --   --   HGB 11.4* 12.9 10.9*  HCT 32.9* 38.0 31.6*  MCV 81.6  --  81.9  PLT 287  --  278    Cardiac Enzymes: No results for input(s): CKTOTAL, CKMB, CKMBINDEX, TROPONINI in the last 168 hours.  Lipid Panel: No results for input(s): CHOL, TRIG, HDL, CHOLHDL, VLDL, LDLCALC in the  last 168 hours.  CBG:  Recent Labs Lab 07/16/14 2133  GLUCAP 138*    Microbiology: Results for orders placed or performed during the hospital encounter of 05/13/14  Culture, blood (routine x 2) Call MD if unable to obtain prior to antibiotics being given     Status: None   Collection Time: 05/13/14  9:14 PM  Result Value Ref Range Status   Specimen Description BLOOD RIGHT HAND  Final   Special Requests BOTTLES DRAWN AEROBIC ONLY 4CC  Final    Culture  Setup Time   Final    05/14/2014 00:29 Performed at [place]   Final    NO GROWTH 5 DAYS Performed at [facility]   Report Status 05/20/2014 FINAL  Final  Culture, blood (routine x 2) Call MD if unable to obtain prior to antibiotics being given     Status: None   Collection Time: 05/13/14  9:27 PM  Result Value Ref Range Status   Specimen Description BLOOD LEFT ARM  Final   Special Requests BOTTLES DRAWN AEROBIC ONLY 3CC  Final   Culture  Setup Time   Final    05/14/2014 00:30 Performed at [place]   Final    NO GROWTH 5 DAYS Performed at [facility]   Report Status 05/20/2014 FINAL  Final  MRSA PCR Screening     Status: None   Collection Time: 05/13/14 10:05 PM  Result Value Ref Range Status   MRSA by PCR NEGATIVE NEGATIVE Final    Comment:        The GeneXpert MRSA Assay (FDA approved for NASAL specimens only), is one component of a comprehensive MRSA colonization surveillance program. It is not intended to diagnose MRSA infection nor to guide or monitor treatment for MRSA infections.  Stat Gram stain     Status: None   Collection Time: 05/17/14  9:42 AM  Result Value Ref Range Status   Specimen Description PLEURAL FLUID LEFT  Final   Special Requests 60ML FLUID  Final   Gram Stain   Final    CYTOSPIN PREP WBC PRESENT,BOTH PMN AND MONONUCLEAR NO ORGANISMS SEEN   Report Status 05/17/2014 FINAL  Final  Body fluid culture     Status: None   Collection Time: 05/17/14  9:42 AM  Result Value Ref Range Status   Specimen Description PLEURAL FLUID LEFT  Final   Special Requests 60ML FLUID  Final   Gram Stain   Final    CYTOSPIN WBC PRESENT,BOTH PMN AND MONONUCLEAR NO ORGANISMS SEEN Performed at [facility] Performed at [facility]   Culture   Final    NO GROWTH 3 DAYS Performed at [facility]   Report Status 05/20/2014 FINAL  Final  Clostridium Difficile by PCR      Status: None   Collection Time: 05/18/14 11:25 AM  Result Value Ref Range Status   C difficile by pcr NEGATIVE NEGATIVE Final    Coagulation Studies:  Recent Labs  07/16/14 2130 07/17/14 0430  LABPROT 18.7* 17.1*  INR 1.55* 1.38    Imaging: Ct Head Wo Contrast  07/16/2014   CLINICAL DATA:  Right-sided weakness, code stroke  EXAM: CT HEAD WITHOUT CONTRAST  TECHNIQUE: Contiguous axial images were obtained from the base of the skull through the vertex without contrast.  COMPARISON:  05/13/2014  FINDINGS: Posterior left parietal encephalomalacia again evident from a remote left MCA territory infarct. Diffuse brain atrophy also  evident with periventricular white matter microvascular ischemic changes. No acute intracranial hemorrhage, definite mass lesion, acute infarction, midline shift, herniation, hydrocephalus, or extra-axial fluid collection. No focal mass effect or edema. Cisterns are patent. No cerebellar abnormality. Orbits are symmetric. Mastoids and sinuses are clear. Exam is limited with motion artifact.  IMPRESSION: Remote posterior left MCA territory infarct with encephalomalacia.  Mild brain atrophy and chronic white matter microvascular change.  No acute intracranial finding by noncontrast CT  These results were called by telephone at the time of interpretation on 07/16/2014 at 9:54 pm to Dr. Doy Mince, who verbally acknowledged these results.   Electronically Signed   By: Daryll Brod M.D.   On: 07/16/2014 21:54    Medications:  Scheduled: . apixaban  5 mg Per Tube BID  . doxycycline  100 mg Oral BID  . famotidine  20 mg Oral BID  . feeding supplement (PRO-STAT SUGAR FREE 64)  30 mL Per Tube Q1500  . feeding supplement (PRO-STAT SUGAR FREE 64)  30 mL Per Tube BID  . free water  150 mL Per Tube 4 times per day  . lacosamide  200 mg Per Tube BID  . levETIRAcetam  750 mg Oral BID  . magnesium oxide  400 mg Per Tube TID  . metoprolol tartrate  37.5 mg Per Tube BID  .  metroNIDAZOLE  500 mg Oral BID  . multivitamin  5 mL Per Tube Daily  . rifampin  300 mg Oral BID  . sodium chloride  3 mL Intravenous Q12H    Assessment/Plan: 78 year old female with a history of seizures and old infarcts on Apixaban for PE and atrial thrombus who presents with seizure activity. Initially felt to be a code stroke but in status epilepticus on presentation. Patient with altered mental status and right clonic activity. Recently diagnosed infection at PEG site. This may be lowering seizure threshold. On multiple antibiotics. On Keppra and Vimpat as well. Has been on Dilantin and Depakote in the past with side effects.  Head CT personally reviewed and shows no acute changes but there is evidence of a chronic left MCA territory infarct. Can not fully rule out the possibility of a new event with current risk factors.  -EEG read pending -check MRI brain without contrast -continue keppra 750mg  BID and Vimpat 200mg  BID -seizure precautions -antibiotic therapy per primary team    LOS: 1 day   Jim Like, DO [facility] [phone]  If 7pm- 7am, please page neurology on call as listed in [place]. 07/17/2014  8:20 AM

## 2014-07-17 NOTE — Progress Notes (Signed)
Stat EEG completed, Dr. Doy Mince aware. Results pending.

## 2014-07-17 NOTE — Plan of Care (Signed)
Problem: Consults Goal: Neurology consult Outcome: Completed/Met Date Met:  07/17/14

## 2014-07-17 NOTE — Progress Notes (Signed)
INITIAL NUTRITION ASSESSMENT  DOCUMENTATION CODES Per approved criteria  -Not Applicable   INTERVENTION:  Speech Path consult for swallow evaluation RD to follow for nutrition care plan, add interventions accordingly  NUTRITION DIAGNOSIS: Inadequate oral intake related to inability to eat as evidenced by NPO status  Goal: Pt to meet >/= 90% of their estimated nutrition needs   Monitor:  PO & supplemental intake, weight, labs, I/O's  Reason for Assessment: Malnutrition Screening Tool Report, New Tube Feeding  78 y.o. female  Admitting Dx: Status epilepticus  ASSESSMENT: 78 y.o. Female with PMH of seizure, stroke, dysphasia with PEG tube placed on 9/4, history of pulmonary embolism on Elliquis, who presents with seizure.  RD unable to obtain nutrition hx.  Pt confused.  Spoke with RN via telephone at [employer].  Pt is currently taking PO diet (Regular with chopped meats) and not utilizing PEG tube for nutrition support.  Apparently, pt's family would like it removed.  Telephone with readback orders received per Dr. Eliseo Squires to D/C current TF orders and place Speech Path consultation.  RD unable to complete Nutrition Focused Physical Exam at this time.  Height: Ht Readings from Last 1 Encounters:  05/17/14 5\' 5"  (1.651 m)    Weight: Wt Readings from Last 1 Encounters:  05/20/14 115 lb 11.9 oz (52.5 kg)    Ideal Body Weight: 125 lb  % Ideal Body Weight: 92%  Wt Readings from Last 10 Encounters:  05/20/14 115 lb 11.9 oz (52.5 kg)    Usual Body Weight: unable to obtain  % Usual Body Weight: ---  BMI:  19.4 kg/m2  Estimated Nutritional Needs: Kcal: 1400-1600 Protein: 65-75 gm Fluid: per MD  Skin: Intact  Diet Order: NPO  EDUCATION NEEDS: -No education needs identified at this time   Intake/Output Summary (Last 24 hours) at 07/17/14 1118 Last data filed at 07/17/14 0800  Gross per 24 hour  Intake    225 ml  Output      0 ml  Net    225 ml     Labs:   Recent Labs Lab 07/16/14 2130 07/16/14 2143 07/17/14 0430  NA 137 139 142  K 4.0 3.9 4.6  CL 101 103 110  CO2 24  --  22  BUN 24* 25* 17  CREATININE 0.59 0.60 0.64  CALCIUM 8.4  --  8.2*  GLUCOSE 140* 147* 94    CBG (last 3)   Recent Labs  07/16/14 2133 07/17/14 0802  GLUCAP 138* 67*    Scheduled Meds: . antiseptic oral rinse  7 mL Mouth Rinse q12n4p  . apixaban  5 mg Per Tube BID  . chlorhexidine  15 mL Mouth Rinse BID  . doxycycline  100 mg Oral BID  . famotidine  20 mg Oral BID  . feeding supplement (PRO-STAT SUGAR FREE 64)  30 mL Per Tube Q1500  . feeding supplement (PRO-STAT SUGAR FREE 64)  30 mL Per Tube BID  . free water  150 mL Per Tube 4 times per day  . lacosamide  200 mg Per Tube BID  . levETIRAcetam  750 mg Oral BID  . magnesium oxide  400 mg Per Tube TID  . metoprolol tartrate  37.5 mg Per Tube BID  . metroNIDAZOLE  500 mg Oral BID  . multivitamin  5 mL Per Tube Daily  . rifampin  300 mg Oral BID  . sodium chloride  3 mL Intravenous Q12H    Continuous Infusions: . sodium chloride 75 mL/hr at  07/17/14 0443  . feeding supplement (JEVITY 1.2 CAL) Stopped (07/17/14 8309)    Past Medical History  Diagnosis Date  . Seizures   . Hypotension   . Pneumonia   . Stroke   . GI bleed   . Pulmonary embolism   . Gastrostomy tube in place   . Memory loss     Past Surgical History  Procedure Laterality Date  . Appendectomy    . Jejunostomy feeding tube      Arthur Holms, RD, LDN Pager #: [phone] After-Hours Pager #: [phone]

## 2014-07-17 NOTE — Progress Notes (Signed)
Patient again hypoglycemic around noon with BG 58. Administered 25 ml of D50. Follow up BG corrected to 81.

## 2014-07-17 NOTE — Progress Notes (Signed)
Hypoglycemic Event  CBG: 67  Treatment: 25 ml of D50  Symptoms: none  Follow-up CBG: Time:0902 CBG Result:88  Possible Reasons for Event: NPO  Comments/MD notified:Yes    Unknown Jim  Remember to initiate Hypoglycemia Order Set & complete

## 2014-07-17 NOTE — Plan of Care (Signed)
Problem: Phase I Progression Outcomes Goal: Hemodynamically stable Outcome: Progressing     

## 2014-07-17 NOTE — Plan of Care (Signed)
Problem: Consults Goal: Care Management Consult if indicated Outcome: Completed/Met Date Met:  07/17/14

## 2014-07-17 NOTE — Progress Notes (Signed)
PROGRESS NOTE  Avianah Pellman ION:[id number] DOB: October 01, 1935 DOA: 07/16/2014 PCP: [name], MD  Assessment/Plan: Seizure:  -CT had has no acute intracranial abnormalities.  -Neurology consulted: recently diagnosed infection at PEG site may have lowered seizure threshold.  S/p ativan EEG Increase keppra Increase vimpat Seizure precautions Continued antibiotic treatment.  MRI of the brain without contrast  History of pulmonary embolism: On Eliquis at home. No bleeding tendency -Continue Elliquis  Dysphagia: Secondary to previous stroke. Has PEG tube.  -continue home Tube feeding regimen -portable chest x ray  Infection of PEG tube: She was started with Flagyl, rifampin and doxycycline on 11/28 in SNF -will continue all 3 antibiotics to complete her course of treatment (Flagyl for 7 days, rifampin for 5 days, doxycycline for 5 days).  Code Status: DNR Family Communication:  Disposition Plan: SDU   Consultants:  neuro  Procedures:      HPI/Subjective: Awake but confused  Objective: Filed Vitals:   07/17/14 0700  BP: 94/54  Pulse:   Resp: 15    Intake/Output Summary (Last 24 hours) at 07/17/14 0758 Last data filed at 07/17/14 0600  Gross per 24 hour  Intake     75 ml  Output      0 ml  Net     75 ml   There were no vitals filed for this visit.  Exam:   General: confused, sitting up in bed  Cardiovascular: rrr  Respiratory: ?rhonchi at base  Abdomen: +BS, soft  Musculoskeletal: no edema   Data Reviewed: Basic Metabolic Panel:  Recent Labs Lab 07/16/14 2130 07/16/14 2143 07/17/14 0430  NA 137 139 142  K 4.0 3.9 4.6  CL 101 103 110  CO2 24  --  22  GLUCOSE 140* 147* 94  BUN 24* 25* 17  CREATININE 0.59 0.60 0.64  CALCIUM 8.4  --  8.2*   Liver Function Tests:  Recent Labs Lab 07/16/14 2130 07/17/14 0430  AST 12 13  ALT 7 7  ALKPHOS 104 94  BILITOT 0.2* <0.2*  PROT 6.9 6.2  ALBUMIN 3.0* 2.8*   No results for  input(s): LIPASE, AMYLASE in the last 168 hours. No results for input(s): AMMONIA in the last 168 hours. CBC:  Recent Labs Lab 07/16/14 2130 07/16/14 2143 07/17/14 0430  WBC 9.0  --  7.4  NEUTROABS 7.3  --   --   HGB 11.4* 12.9 10.9*  HCT 32.9* 38.0 31.6*  MCV 81.6  --  81.9  PLT 287  --  278   Cardiac Enzymes: No results for input(s): CKTOTAL, CKMB, CKMBINDEX, TROPONINI in the last 168 hours. BNP (last 3 results) No results for input(s): PROBNP in the last 8760 hours. CBG:  Recent Labs Lab 07/16/14 2133  GLUCAP 138*    No results found for this or any previous visit (from the past 240 hour(s)).   Studies: Ct Head Wo Contrast  07/16/2014   CLINICAL DATA:  Right-sided weakness, code stroke  EXAM: CT HEAD WITHOUT CONTRAST  TECHNIQUE: Contiguous axial images were obtained from the base of the skull through the vertex without contrast.  COMPARISON:  05/13/2014  FINDINGS: Posterior left parietal encephalomalacia again evident from a remote left MCA territory infarct. Diffuse brain atrophy also evident with periventricular white matter microvascular ischemic changes. No acute intracranial hemorrhage, definite mass lesion, acute infarction, midline shift, herniation, hydrocephalus, or extra-axial fluid collection. No focal mass effect or edema. Cisterns are patent. No cerebellar abnormality. Orbits are symmetric. Mastoids and sinuses are clear. Exam is  limited with motion artifact.  IMPRESSION: Remote posterior left MCA territory infarct with encephalomalacia.  Mild brain atrophy and chronic white matter microvascular change.  No acute intracranial finding by noncontrast CT  These results were called by telephone at the time of interpretation on 07/16/2014 at 9:54 pm to Dr. Doy Mince, who verbally acknowledged these results.   Electronically Signed   By: Daryll Brod M.D.   On: 07/16/2014 21:54    Scheduled Meds: . apixaban  5 mg Per Tube BID  . doxycycline  100 mg Oral BID  .  famotidine  20 mg Oral BID  . feeding supplement (PRO-STAT SUGAR FREE 64)  30 mL Per Tube Q1500  . feeding supplement (PRO-STAT SUGAR FREE 64)  30 mL Per Tube BID  . free water  150 mL Per Tube 4 times per day  . lacosamide  200 mg Per Tube BID  . levETIRAcetam  750 mg Oral BID  . magnesium oxide  400 mg Per Tube TID  . metoprolol tartrate  37.5 mg Per Tube BID  . metroNIDAZOLE  500 mg Oral BID  . multivitamin  5 mL Per Tube Daily  . rifampin  300 mg Oral BID  . sodium chloride  3 mL Intravenous Q12H   Continuous Infusions: . sodium chloride 75 mL/hr at 07/17/14 0443  . feeding supplement (JEVITY 1.2 CAL) Stopped (07/17/14 0432)   Antibiotics Given (last 72 hours)    None      Principal Problem:   Status epilepticus Active Problems:   Seizure   CVA (cerebral infarction)   Stroke   GI bleed   Gastrostomy tube in place   Dysphasia    Time spent: 35 min    Ciera Beckum  [facility] Pager [phone]. If 7PM-7AM, please contact night-coverage at [email or web address], password [facility] 07/17/2014, 7:58 AM  LOS: 1 day

## 2014-07-17 NOTE — Plan of Care (Signed)
Problem: Phase I Progression Outcomes Goal: Pain controlled with appropriate interventions Outcome: Completed/Met Date Met:  07/17/14  Comments:  Patient has no complaints of pain

## 2014-07-18 DIAGNOSIS — R569 Unspecified convulsions: Secondary | ICD-10-CM

## 2014-07-18 LAB — BASIC METABOLIC PANEL
ANION GAP: 10 (ref 5–15)
BUN: 8 mg/dL (ref 6–23)
CO2: 22 mEq/L (ref 19–32)
CREATININE: 0.54 mg/dL (ref 0.50–1.10)
Calcium: 8.3 mg/dL — ABNORMAL LOW (ref 8.4–10.5)
Chloride: 107 mEq/L (ref 96–112)
GFR calc non Af Amer: 89 mL/min — ABNORMAL LOW (ref >90)
Glucose, Bld: 94 mg/dL (ref 70–99)
POTASSIUM: 3.9 meq/L (ref 3.7–5.3)
Sodium: 139 mEq/L (ref 137–147)

## 2014-07-18 LAB — GLUCOSE, CAPILLARY
GLUCOSE-CAPILLARY: 86 mg/dL (ref 70–99)
Glucose-Capillary: 79 mg/dL (ref 70–99)
Glucose-Capillary: 94 mg/dL (ref 70–99)
Glucose-Capillary: 75 mg/dL (ref 70–99)
Glucose-Capillary: 106 mg/dL — ABNORMAL HIGH (ref 70–99)
Glucose-Capillary: 63 mg/dL — ABNORMAL LOW (ref 70–99)
Glucose-Capillary: 92 mg/dL (ref 70–99)

## 2014-07-18 LAB — CBC
HCT: 30.9 % — ABNORMAL LOW (ref 36.0–46.0)
Hemoglobin: 10.6 g/dL — ABNORMAL LOW (ref 12.0–15.0)
MCH: 27.7 pg (ref 26.0–34.0)
MCHC: 34.3 g/dL (ref 30.0–36.0)
MCV: 80.9 fL (ref 78.0–100.0)
Platelets: 286 10*3/uL (ref 150–400)
RBC: 3.82 MIL/uL — ABNORMAL LOW (ref 3.87–5.11)
RDW: 15 % (ref 11.5–15.5)
WBC: 6 10*3/uL (ref 4.0–10.5)

## 2014-07-18 MED ORDER — GLUCERNA 1.2 CAL PO LIQD
1000.0000 mL | ORAL | Status: DC
  Administered 2014-07-19: 1000 mL
  Filled 2014-07-18 (×5): qty 1000

## 2014-07-18 MED ORDER — JEVITY 1.2 CAL PO LIQD
1000.0000 mL | ORAL | Status: DC
Start: 2014-07-18 — End: 2014-07-18
  Filled 2014-07-18 (×2): qty 1000

## 2014-07-18 MED ORDER — DEXTROSE-NACL 5-0.9 % IV SOLN
INTRAVENOUS | Status: DC
  Administered 2014-07-18: 75 mL/h via INTRAVENOUS

## 2014-07-18 MED ORDER — MUPIROCIN 2 % EX OINT
1.0000 "application " | TOPICAL_OINTMENT | Freq: Two times a day (BID) | CUTANEOUS | Status: DC
  Administered 2014-07-18 – 2014-07-22 (×9): 1 via NASAL
  Filled 2014-07-18 (×2): qty 22

## 2014-07-18 MED ORDER — HALOPERIDOL LACTATE 2 MG/ML PO CONC
2.0000 mg | Freq: Four times a day (QID) | ORAL | Status: DC | PRN
  Filled 2014-07-18: qty 1

## 2014-07-18 MED ORDER — DEXTROSE 50 % IV SOLN
25.0000 mL | Freq: Once | INTRAVENOUS | Status: AC
  Administered 2014-07-18: 25 mL via INTRAVENOUS

## 2014-07-18 MED ORDER — CHLORHEXIDINE GLUCONATE CLOTH 2 % EX PADS
6.0000 | MEDICATED_PAD | Freq: Every day | CUTANEOUS | Status: DC
  Administered 2014-07-19 – 2014-07-21 (×3): 6 via TOPICAL

## 2014-07-18 NOTE — Clinical Social Work Note (Signed)
Clinical Social Worker received handoff from formerly assigned [facility] indicating that patient is a resident from [facility]. CSW to follow to facilitate dc needs.   Glendon Axe, MSW, LCSWA [phone] 07/18/2014 4:23 PM

## 2014-07-18 NOTE — Evaluation (Signed)
Clinical/Bedside Swallow Evaluation Patient Details  Name: Tanya White MRN: [id number] Date of Birth: 1936-07-07  Today's Date: 07/18/2014 Time: 1410-1420 SLP Time Calculation (min) (ACUTE ONLY): 10 min  Past Medical History:  Past Medical History  Diagnosis Date  . Seizures   . Hypotension   . Pneumonia   . Stroke   . GI bleed   . Pulmonary embolism   . Gastrostomy tube in place   . Memory loss    Past Surgical History:  Past Surgical History  Procedure Laterality Date  . Appendectomy    . Jejunostomy feeding tube     HPI:  78 y.o. female SNF resident with a history of stroke, infection on multiple antibiotics and seizures admitted with concern for seizure.  MRI brain shows remote posterior left MCA infarct with encephalomalacia.  Pt with a baseline expressive/receptive aphasia; PEG in place.  Per report, pt was taking a PO diet at SNF with questions re: removal of PEG.    Assessment / Plan / Recommendation Clinical Impression  Pt with baseline aphasia/apraxia presents with intact cranial nerve function, ability to follow some simple commands, but poor recognition/acceptance of POs.  Max assist to engage in automatic sequencing for self-feeding, but pt resistant.  Accepted limited water, a portion of which she swallowed with no overt s/s of aspiration.  The rest of the bolus was expectorated.  Evaluation discontinued.  As MS allows, recommend re-evaluation of swallow to determine potential for resuming POs, ability to sustain the attention needed for adequate intake, and necessity of PEG.  Further assessment can likely be completed after pt returns to SNF.  Recommend PEG for nutrition for now.            Diet Recommendation  (PEG )   Medication Administration: Via alternative means    Other  Recommendations Oral Care Recommendations: Oral care Q4 per protocol   Follow Up Recommendations  Skilled Nursing facility (swallow reassessment)        Swallow Study Prior  Functional Status       General HPI: 78 y.o. female SNF resident with a history of stroke, infection on multiple antibiotics and seizures admitted with concern for seizure.  MRI brain shows remote posterior left MCA infarct with encephalomalacia.  Pt with a baseline expressive/receptive aphasia; PEG in place.  Per report, pt was taking a PO diet at SNF with questions re: removal of PEG.  Type of Study: Bedside swallow evaluation Previous Swallow Assessment: none per records Diet Prior to this Study: NPO;PEG tube Temperature Spikes Noted: No Respiratory Status: Room air History of Recent Intubation: No Behavior/Cognition: Alert;Confused;Requires cueing Oral Cavity - Dentition: Missing dentition Self-Feeding Abilities: Needs assist Patient Positioning: Upright in bed Baseline Vocal Quality: Clear Volitional Cough: Cognitively unable to elicit Volitional Swallow: Unable to elicit    Oral/Motor/Sensory Function Overall Oral Motor/Sensory Function: Appears within functional limits for tasks assessed (symmetrical at baseline)   Ice Chips Ice chips: Not tested   Thin Liquid Thin Liquid: Impaired Presentation: Cup;Spoon Oral Phase Impairments: Poor awareness of bolus;Other (comment) (poor acceptance of bolus; poor recognition) Pharyngeal  Phase Impairments:  (expectorated water)    Nectar Thick Nectar Thick Liquid: Not tested   Honey Thick Honey Thick Liquid: Not tested   Puree Puree: Impaired Presentation: Spoon (poor bolus recognition/acceptance) Oral Phase Impairments: Poor awareness of bolus   Solid Karolynn Infantino L. [place], [place] CCC/SLP Pager [phone]     Solid: Not tested       Juan Quam Laurice 07/18/2014,3:00 PM

## 2014-07-18 NOTE — Progress Notes (Addendum)
Subjective: No overnight events. MRI brain and EEG completed. No further seizure activity.  MRI brain shows remote posterior left MCA infarct with encephalomalacia. EEG findings consistent with a mild encephalopathy.   History: Tanya White is an 78 y.o. female SNF resident with a history of stroke, infection on multiple antibiotics and seizures who was at her facility this evening. While walking the patient at 1730 she was noted to have difficulty with speech and right eye deviation. At some point EMS was called and the patient was brought in as a code stroke. EMS did notice some right leg twitching. This stopped spontaneously. At arrival the patient as noted to have some facial twitching. On arrival to the CT scanner patient had clonic movement of the entire right side. Although awake, speech was unintelligible. Code stroke cancelled due to patient being in status epilepticus.  Baseline per notes is able to take two steps with full assist, right hemiplegia, dysarthria and some aphasia.   Objective: Current vital signs: BP 135/68 mmHg  Pulse 74  Temp(Src) 97.8 F (36.6 C) (Oral)  Resp 13  SpO2 98% Vital signs in last 24 hours: Temp:  [97.4 F (36.3 C)-98.4 F (36.9 C)] 97.8 F (36.6 C) (12/03 0402) Pulse Rate:  [74-122] 74 (12/03 0600) Resp:  [13-27] 13 (12/03 0600) BP: (113-157)/(55-79) 135/68 mmHg (12/03 0600) SpO2:  [95 %-100 %] 98 % (12/03 0600)  Intake/Output from previous day: 12/02 0701 - 12/03 0700 In: 2325 [I.V.:1725; NG/GT:600] Out: 2440 [Urine:2440] Intake/Output this shift:   Nutritional status: Diet NPO time specified  Neurologic Exam: Neurologic Examination Mental Status: Alert, expressive and receptive aphasia. Does not follow commands.  Cranial Nerves: II: Pupils equal, round, reactive to light  III,IV, VI: ptosis not present, intact oculocephalic maneuvers bilaterally. No gaze deviation. V,VII: smile symmetric, facial light touch sensation  normal bilaterally. Right clonic activity of the face VIII: hearing unable to be tested IX,X: gag reflex present XI: shoulder shrug unable to be tested XII: tongue extension unable to be tested Motor: Patient moves left upper and lower extremities spontaneously and against gravity. No spontaneous movement noted of RUE, minimal movement RLE Sensory: intact to LT all extremities Deep Tendon Reflexes: 2+ in the upper extremities, 1+ at the knees and absent at the ankles.  Plantars: Right: upgoingLeft: downgoing Cerebellar: Unable to perform secondary to mental status Gait: Unable to perform secondary to multiple leads in ICU  Lab Results: Basic Metabolic Panel:  Recent Labs Lab 07/16/14 2130 07/16/14 2143 07/17/14 0430 07/18/14 0345  NA 137 139 142 139  K 4.0 3.9 4.6 3.9  CL 101 103 110 107  CO2 24  --  22 22  GLUCOSE 140* 147* 94 94  BUN 24* 25* 17 8  CREATININE 0.59 0.60 0.64 0.54  CALCIUM 8.4  --  8.2* 8.3*    Liver Function Tests:  Recent Labs Lab 07/16/14 2130 07/17/14 0430  AST 12 13  ALT 7 7  ALKPHOS 104 94  BILITOT 0.2* <0.2*  PROT 6.9 6.2  ALBUMIN 3.0* 2.8*   No results for input(s): LIPASE, AMYLASE in the last 168 hours. No results for input(s): AMMONIA in the last 168 hours.  CBC:  Recent Labs Lab 07/16/14 2130 07/16/14 2143 07/17/14 0430 07/18/14 0345  WBC 9.0  --  7.4 6.0  NEUTROABS 7.3  --   --   --   HGB 11.4* 12.9 10.9* 10.6*  HCT 32.9* 38.0 31.6* 30.9*  MCV 81.6  --  81.9 80.9  PLT  287  --  278 286    Cardiac Enzymes: No results for input(s): CKTOTAL, CKMB, CKMBINDEX, TROPONINI in the last 168 hours.  Lipid Panel: No results for input(s): CHOL, TRIG, HDL, CHOLHDL, VLDL, LDLCALC in the last 168 hours.  CBG:  Recent Labs Lab 07/17/14 1454 07/17/14 1600 07/17/14 1926 07/17/14 2345 07/18/14 0346  GLUCAP 81 80 86 79 94    Microbiology: Results for orders placed or performed during the hospital  encounter of 07/16/14  MRSA PCR Screening     Status: Abnormal   Collection Time: 07/17/14  6:50 AM  Result Value Ref Range Status   MRSA by PCR POSITIVE (A) NEGATIVE Final    Comment:        The GeneXpert MRSA Assay (FDA approved for NASAL specimens only), is one component of a comprehensive MRSA colonization surveillance program. It is not intended to diagnose MRSA infection nor to guide or monitor treatment for MRSA infections. RESULT CALLED TO, READ BACK BY AND VERIFIED WITH: Amedeo Plenty AT 8676 07/17/14 BY K BARR     Coagulation Studies:  Recent Labs  07/16/14 2130 07/17/14 0430  LABPROT 18.7* 17.1*  INR 1.55* 1.38    Imaging: Ct Head Wo Contrast  07/16/2014   CLINICAL DATA:  Right-sided weakness, code stroke  EXAM: CT HEAD WITHOUT CONTRAST  TECHNIQUE: Contiguous axial images were obtained from the base of the skull through the vertex without contrast.  COMPARISON:  05/13/2014  FINDINGS: Posterior left parietal encephalomalacia again evident from a remote left MCA territory infarct. Diffuse brain atrophy also evident with periventricular white matter microvascular ischemic changes. No acute intracranial hemorrhage, definite mass lesion, acute infarction, midline shift, herniation, hydrocephalus, or extra-axial fluid collection. No focal mass effect or edema. Cisterns are patent. No cerebellar abnormality. Orbits are symmetric. Mastoids and sinuses are clear. Exam is limited with motion artifact.  IMPRESSION: Remote posterior left MCA territory infarct with encephalomalacia.  Mild brain atrophy and chronic white matter microvascular change.  No acute intracranial finding by noncontrast CT  These results were called by telephone at the time of interpretation on 07/16/2014 at 9:54 pm to Dr. Doy Mince, who verbally acknowledged these results.   Electronically Signed   By: Daryll Brod M.D.   On: 07/16/2014 21:54   Mr Brain Wo Contrast  07/17/2014   CLINICAL DATA:  Seizure  EXAM:  MRI HEAD WITHOUT CONTRAST  TECHNIQUE: Multiplanar, multiecho pulse sequences of the brain and surrounding structures were obtained without intravenous contrast.  COMPARISON:  CT head 07/16/2014  FINDINGS: Chronic hemorrhagic infarct in the left temporoparietal lobe with volume loss and encephalomalacia. Small area of chronic ischemia in the left thalamus. Mild chronic microvascular ischemic changes in the white matter.  Negative for acute infarct.  Mild atrophy. Negative for mass or edema. Negative for hydrocephalus. Small area of chronic hemorrhage in the right parietal white matter.  Paranasal sinuses are clear.  IMPRESSION: Chronic hemorrhagic infarct left temporoparietal lobe. Mild chronic microvascular ischemic changes in the white matter. No acute abnormality.   Electronically Signed   By: Franchot Gallo M.D.   On: 07/17/2014 14:30   Dg Chest Port 1 View  07/17/2014   CLINICAL DATA:  Initial evaluation for pneumonia cough confusion  EXAM: PORTABLE CHEST - 1 VIEW  COMPARISON:  05/18/1949  FINDINGS: Mild to moderate cardiac enlargement stable. Uncoiling of the aorta. Vascular pattern normal. No consolidation or effusion. 5 mm nodular opacity left apex new from prior studies.  IMPRESSION: 1. No evidence of pneumonia  2. Consider CT thorax for 5 mm new nodular opacity left lung apex   Electronically Signed   By: Skipper Cliche M.D.   On: 07/17/2014 08:29    Medications:  Scheduled: . antiseptic oral rinse  7 mL Mouth Rinse q12n4p  . apixaban  5 mg Per Tube BID  . chlorhexidine  15 mL Mouth Rinse BID  . famotidine  20 mg Oral BID  . free water  150 mL Per Tube 4 times per day  . lacosamide  200 mg Per Tube BID  . levETIRAcetam  750 mg Oral BID  . magnesium oxide  400 mg Per Tube TID  . metoprolol tartrate  37.5 mg Per Tube BID  . metroNIDAZOLE  500 mg Oral BID  . multivitamin  5 mL Per Tube Daily  . sodium chloride  3 mL Intravenous Q12H    Assessment/Plan: 78 year old female with a history  of seizures and old infarcts on Apixaban for PE and atrial thrombus who presents with seizure activity. Initially felt to be a code stroke but in status epilepticus on presentation.Patient with altered mental status and right clonic activity. Recently diagnosed infection at PEG site andUA shows likely UTI. This may be lowering seizure threshold which contributed to breakthrough seizures.MRI brain shows no acute process. EEG with no seizure activity.    Dr Eliseo Squires discussed baseline with patients nephew and she appears close to baseline at this point.   -continue keppra 750mg  BID and Vimpat 200mg  BID -seizure precautions -infectious workup and treatment per primary team -no further neurological workup at this time. Please call with further questions.    LOS: 2 days   Jim Like, DO [facility] [phone]  If 7pm- 7am, please page neurology on call as listed in [place]. 07/18/2014  7:56 AM

## 2014-07-18 NOTE — Progress Notes (Signed)
Pt admitted to 4N01 from 2S at 1500, received report from Su Ley. dx. Status epilepticus, contact isolation. Attempted to orient patient to room but she did not indicate understanding. Brother accompanied patient to floor when admitted. She is a resident of [facility].

## 2014-07-18 NOTE — Clinical Social Work Note (Signed)
Case manager informed this CSW that patient is from [facility].  Informed unit CSW that patient is from [facility].  Jones Broom. [place], MSW, [place] 07/18/2014 4:04 PM

## 2014-07-18 NOTE — Progress Notes (Signed)
Hypoglycemic Event  CBG:63  Treatment: D50 IV 25 mL  Symptoms: None  Follow-up CBG: [id number] [id number]  Possible Reasons for Event: Inadequate meal intake and Other: npo  Comments/MD notified:Dr Royetta Crochet, Richardo Hanks  Remember to initiate Hypoglycemia Order Set & complete

## 2014-07-18 NOTE — Progress Notes (Addendum)
NUTRITION CONSULT/FOLLOW UP  DOCUMENTATION CODES Per approved criteria  -Not Applicable   INTERVENTION: Initiate Glucerna 1.2 formula at 15 ml/hr and increase by 10 ml every 4 hours to goal rate of 55 ml/hr to provide 1584 kcals, 79 gm protein, 1063 ml of free water RD to follow for nutrition care plani  NUTRITION DIAGNOSIS: Inadequate oral intake related to inability to eat as evidenced by NPO status, ongoing  Goal: Pt to meet >/= 90% of their estimated nutrition needs, currently unmet  Monitor:  TF regimen & tolerance, PO diet advancement, weight, labs, I/O's  ASSESSMENT: 78 y.o. Female with PMH of seizure, stroke, dysphasia with PEG tube placed on 9/4, history of pulmonary embolism on Elliquis, who presents with seizure.  Neurology note reviewed.  MRI brain showed remote posterior left MCA infarct with encephalomalacia.   S/p bedside swallow evaluation today.  SLP recommending utilizing PEG tube for nutrition support.  Noted pt with hypoglycemic episode.  RD consulted for TF initiation & management.  Height: Ht Readings from Last 1 Encounters:  05/17/14 5\' 5"  (1.651 m)    Weight: Wt Readings from Last 1 Encounters:  05/20/14 115 lb 11.9 oz (52.5 kg)    Estimated Nutritional Needs: Kcal: 1400-1600 Protein: 65-75 gm Fluid: per MD  Skin: Intact  Diet Order: NPO   Intake/Output Summary (Last 24 hours) at 07/18/14 1452 Last data filed at 07/18/14 1400  Gross per 24 hour  Intake   1800 ml  Output   3320 ml  Net  -1520 ml    Labs:   Recent Labs Lab 07/16/14 2130 07/16/14 2143 07/17/14 0430 07/18/14 0345  NA 137 139 142 139  K 4.0 3.9 4.6 3.9  CL 101 103 110 107  CO2 24  --  22 22  BUN 24* 25* 17 8  CREATININE 0.59 0.60 0.64 0.54  CALCIUM 8.4  --  8.2* 8.3*  GLUCOSE 140* 147* 94 94    CBG (last 3)   Recent Labs  07/18/14 0346 07/18/14 0803 07/18/14 1200  GLUCAP 94 86 75    Scheduled Meds: . antiseptic oral rinse  7 mL Mouth Rinse  q12n4p  . apixaban  5 mg Per Tube BID  . chlorhexidine  15 mL Mouth Rinse BID  . Chlorhexidine Gluconate Cloth  6 each Topical Q0600  . famotidine  20 mg Oral BID  . free water  150 mL Per Tube 4 times per day  . lacosamide  200 mg Per Tube BID  . levETIRAcetam  750 mg Oral BID  . magnesium oxide  400 mg Per Tube TID  . metoprolol tartrate  37.5 mg Per Tube BID  . metroNIDAZOLE  500 mg Oral BID  . multivitamin  5 mL Per Tube Daily  . mupirocin ointment  1 application Nasal BID  . sodium chloride  3 mL Intravenous Q12H    Continuous Infusions: . dextrose 5 % and 0.9% NaCl 75 mL/hr (07/18/14 1423)  . feeding supplement (JEVITY 1.2 CAL)      Past Medical History  Diagnosis Date  . Seizures   . Hypotension   . Pneumonia   . Stroke   . GI bleed   . Pulmonary embolism   . Gastrostomy tube in place   . Memory loss     Past Surgical History  Procedure Laterality Date  . Appendectomy    . Jejunostomy feeding tube      Arthur Holms, RD, LDN Pager #: [phone] After-Hours Pager #: [phone]

## 2014-07-18 NOTE — Plan of Care (Signed)
Problem: Phase I Progression Outcomes Goal: Hemodynamically stable Outcome: Completed/Met Date Met:  07/18/14     

## 2014-07-18 NOTE — Progress Notes (Addendum)
PROGRESS NOTE  Tanya White XTK:[id number] DOB: April 20, 1936 DOA: 07/16/2014 PCP: [name], MD  Assessment/Plan: Seizure:  -CT had has no acute intracranial abnormalities.  -Neurology consulted: recently diagnosed infection at PEG site may have lowered seizure threshold.  S/p ativan EEG Increase keppra Increase vimpat Seizure precautions Continued antibiotic treatment.  MRI of the brain without contrast: chronic issues  History of pulmonary embolism: On Eliquis at home. No bleeding tendency -Continue Eliquis  Dysphagia: Secondary to previous stroke. Has PEG tube.  -SLP eval  Infection of PEG tube: She was started with Flagyl, rifampin and doxycycline on 11/28 in SNF -will continue all 3 antibiotics to complete her course of treatment (Flagyl for 7 days, rifampin for 5 days, doxycycline for 5 days). SLP eval- ? If patient passes eval, remove tube  Code Status: DNR Family Communication: spoke with nephew Disposition Plan: tx to floor (med surge)   Consultants:  neuro  Procedures:      HPI/Subjective: Awake but confused- speaks but does not make sense  Objective: Filed Vitals:   07/18/14 0600  BP: 135/68  Pulse: 74  Temp:   Resp: 13    Intake/Output Summary (Last 24 hours) at 07/18/14 0753 Last data filed at 07/18/14 0600  Gross per 24 hour  Intake   2325 ml  Output   2440 ml  Net   -115 ml   There were no vitals filed for this visit.  Exam:   General: confused, pleasant, NAD  Cardiovascular: rrr  Respiratory: clear, no wheezing  Abdomen: +BS, soft- PEG tube in place- foul smelling  Musculoskeletal: no edema   Data Reviewed: Basic Metabolic Panel:  Recent Labs Lab 07/16/14 2130 07/16/14 2143 07/17/14 0430 07/18/14 0345  NA 137 139 142 139  K 4.0 3.9 4.6 3.9  CL 101 103 110 107  CO2 24  --  22 22  GLUCOSE 140* 147* 94 94  BUN 24* 25* 17 8  CREATININE 0.59 0.60 0.64 0.54  CALCIUM 8.4  --  8.2* 8.3*   Liver Function  Tests:  Recent Labs Lab 07/16/14 2130 07/17/14 0430  AST 12 13  ALT 7 7  ALKPHOS 104 94  BILITOT 0.2* <0.2*  PROT 6.9 6.2  ALBUMIN 3.0* 2.8*   No results for input(s): LIPASE, AMYLASE in the last 168 hours. No results for input(s): AMMONIA in the last 168 hours. CBC:  Recent Labs Lab 07/16/14 2130 07/16/14 2143 07/17/14 0430 07/18/14 0345  WBC 9.0  --  7.4 6.0  NEUTROABS 7.3  --   --   --   HGB 11.4* 12.9 10.9* 10.6*  HCT 32.9* 38.0 31.6* 30.9*  MCV 81.6  --  81.9 80.9  PLT 287  --  278 286   Cardiac Enzymes: No results for input(s): CKTOTAL, CKMB, CKMBINDEX, TROPONINI in the last 168 hours. BNP (last 3 results) No results for input(s): PROBNP in the last 8760 hours. CBG:  Recent Labs Lab 07/17/14 1454 07/17/14 1600 07/17/14 1926 07/17/14 2345 07/18/14 0346  GLUCAP 81 80 86 79 94    Recent Results (from the past 240 hour(s))  MRSA PCR Screening     Status: Abnormal   Collection Time: 07/17/14  6:50 AM  Result Value Ref Range Status   MRSA by PCR POSITIVE (A) NEGATIVE Final    Comment:        The GeneXpert MRSA Assay (FDA approved for NASAL specimens only), is one component of a comprehensive MRSA colonization surveillance program. It is not intended to diagnose MRSA  infection nor to guide or monitor treatment for MRSA infections. RESULT CALLED TO, READ BACK BY AND VERIFIED WITH: Amedeo Plenty AT 7829 07/17/14 BY K BARR      Studies: Ct Head Wo Contrast  07/16/2014   CLINICAL DATA:  Right-sided weakness, code stroke  EXAM: CT HEAD WITHOUT CONTRAST  TECHNIQUE: Contiguous axial images were obtained from the base of the skull through the vertex without contrast.  COMPARISON:  05/13/2014  FINDINGS: Posterior left parietal encephalomalacia again evident from a remote left MCA territory infarct. Diffuse brain atrophy also evident with periventricular white matter microvascular ischemic changes. No acute intracranial hemorrhage, definite mass lesion, acute  infarction, midline shift, herniation, hydrocephalus, or extra-axial fluid collection. No focal mass effect or edema. Cisterns are patent. No cerebellar abnormality. Orbits are symmetric. Mastoids and sinuses are clear. Exam is limited with motion artifact.  IMPRESSION: Remote posterior left MCA territory infarct with encephalomalacia.  Mild brain atrophy and chronic white matter microvascular change.  No acute intracranial finding by noncontrast CT  These results were called by telephone at the time of interpretation on 07/16/2014 at 9:54 pm to Dr. Doy Mince, who verbally acknowledged these results.   Electronically Signed   By: Daryll Brod M.D.   On: 07/16/2014 21:54   Mr Brain Wo Contrast  07/17/2014   CLINICAL DATA:  Seizure  EXAM: MRI HEAD WITHOUT CONTRAST  TECHNIQUE: Multiplanar, multiecho pulse sequences of the brain and surrounding structures were obtained without intravenous contrast.  COMPARISON:  CT head 07/16/2014  FINDINGS: Chronic hemorrhagic infarct in the left temporoparietal lobe with volume loss and encephalomalacia. Small area of chronic ischemia in the left thalamus. Mild chronic microvascular ischemic changes in the white matter.  Negative for acute infarct.  Mild atrophy. Negative for mass or edema. Negative for hydrocephalus. Small area of chronic hemorrhage in the right parietal white matter.  Paranasal sinuses are clear.  IMPRESSION: Chronic hemorrhagic infarct left temporoparietal lobe. Mild chronic microvascular ischemic changes in the white matter. No acute abnormality.   Electronically Signed   By: Franchot Gallo M.D.   On: 07/17/2014 14:30   Dg Chest Port 1 View  07/17/2014   CLINICAL DATA:  Initial evaluation for pneumonia cough confusion  EXAM: PORTABLE CHEST - 1 VIEW  COMPARISON:  05/18/1949  FINDINGS: Mild to moderate cardiac enlargement stable. Uncoiling of the aorta. Vascular pattern normal. No consolidation or effusion. 5 mm nodular opacity left apex new from prior studies.   IMPRESSION: 1. No evidence of pneumonia 2. Consider CT thorax for 5 mm new nodular opacity left lung apex   Electronically Signed   By: Skipper Cliche M.D.   On: 07/17/2014 08:29    Scheduled Meds: . antiseptic oral rinse  7 mL Mouth Rinse q12n4p  . apixaban  5 mg Per Tube BID  . chlorhexidine  15 mL Mouth Rinse BID  . famotidine  20 mg Oral BID  . free water  150 mL Per Tube 4 times per day  . lacosamide  200 mg Per Tube BID  . levETIRAcetam  750 mg Oral BID  . magnesium oxide  400 mg Per Tube TID  . metoprolol tartrate  37.5 mg Per Tube BID  . metroNIDAZOLE  500 mg Oral BID  . multivitamin  5 mL Per Tube Daily  . sodium chloride  3 mL Intravenous Q12H   Continuous Infusions: . sodium chloride 75 mL/hr (07/17/14 1830)   Antibiotics Given (last 72 hours)    Date/Time Action Medication Dose  07/17/14 1037 Given   doxycycline (VIBRA-TABS) tablet 100 mg 100 mg   07/17/14 1037 Given   metroNIDAZOLE (FLAGYL) tablet 500 mg 500 mg   07/17/14 1038 Given   rifampin (RIFADIN) capsule 300 mg 300 mg   07/17/14 2225 Given   doxycycline (VIBRA-TABS) tablet 100 mg 100 mg   07/17/14 2225 Given   metroNIDAZOLE (FLAGYL) tablet 500 mg 500 mg   07/17/14 2225 Given   rifampin (RIFADIN) capsule 300 mg 300 mg      Principal Problem:   Status epilepticus Active Problems:   Seizure   CVA (cerebral infarction)   Stroke   GI bleed   Pulmonary embolism   Gastrostomy tube in place   Dysphasia    Time spent: 25 min    Lenor Provencher  [facility] Pager [phone]. If 7PM-7AM, please contact night-coverage at [email or web address], password [facility] 07/18/2014, 7:53 AM  LOS: 2 days

## 2014-07-19 LAB — GLUCOSE, CAPILLARY
GLUCOSE-CAPILLARY: 100 mg/dL — AB (ref 70–99)
Glucose-Capillary: 104 mg/dL — ABNORMAL HIGH (ref 70–99)
Glucose-Capillary: 109 mg/dL — ABNORMAL HIGH (ref 70–99)
Glucose-Capillary: 118 mg/dL — ABNORMAL HIGH (ref 70–99)

## 2014-07-19 MED ORDER — INFLUENZA VAC SPLIT QUAD 0.5 ML IM SUSY
0.5000 mL | PREFILLED_SYRINGE | INTRAMUSCULAR | Status: DC
  Filled 2014-07-19: qty 0.5

## 2014-07-19 MED ORDER — INFLUENZA VAC SPLIT QUAD 0.5 ML IM SUSY: 0.5000 mL | PREFILLED_SYRINGE | INTRAMUSCULAR | Status: DC

## 2014-07-19 NOTE — Progress Notes (Signed)
Family member who does not have MPOA papers requested full code.  Will need to see papers before changing as admitting doctor entered DNR orders.

## 2014-07-19 NOTE — Progress Notes (Signed)
48 hours Calorie Count ordered per MD.  Calorie count envelope has been hung on the back of  patient's door. RN aware to document percent consumed for each item on the patient's meal tray ticket and keep in envelope. Also document percent of any supplement or snack pt consumes and keep documentation in envelope for RD to review.  Patient and family made aware that calorie count will be in progress through the weekend. Patient reports that she is hungry and ready to eat.  RD to follow-up Monday.  Pryor Ochoa RD, LDN Inpatient Clinical Dietitian Pager: [phone] After Hours Pager: [phone]

## 2014-07-19 NOTE — Progress Notes (Signed)
Per Dr. Eliseo Squires do not restart PIV. Tasia Catchings RN  [facility]                    bc

## 2014-07-19 NOTE — Evaluation (Signed)
Clinical/Bedside Swallow Evaluation Patient Details  Name: Kambrey Hagger MRN: [id number] Date of Birth: December 12, 1935  Today's Date: 07/19/2014 Time: 9381-0175 SLP Time Calculation (min) (ACUTE ONLY): 15 min  Past Medical History:  Past Medical History  Diagnosis Date  . Seizures   . Hypotension   . Pneumonia   . Stroke   . GI bleed   . Pulmonary embolism   . Gastrostomy tube in place   . Memory loss    Past Surgical History:  Past Surgical History  Procedure Laterality Date  . Appendectomy    . Jejunostomy feeding tube     HPI:  78 y.o. female SNF resident with a history of stroke, infection on multiple antibiotics and seizures admitted with concern for seizure.  MRI brain shows remote posterior left MCA infarct with encephalomalacia.  Pt with a baseline expressive/receptive aphasia; PEG in place.  Per report, pt was taking a PO diet at SNF with questions re: removal of PEG.   Pt had significant difficulty attending to and accepting POs yesterday.  Improved MS today with new orders to repeat eval.    Assessment / Plan / Recommendation Clinical Impression  Pt with slightly improved MS today; able to engage in some social communication, but baseline posterior aphasia continues to impact comprehension tremendously.  Pt with improved recognition/acceptance of POs.   Swallow function appears to be WNL with active mastication of solids, consistent swallow response, and no overt s/s of aspiration.  Pt has difficulty comprehending utensil use, but is able to feed herself easily with finger-foods.  Recommend resuming a regular diet with all liquids; meds in liquids.  1:1 assist with self feeding; allow pt to feed herself as much as possible.  Primary barrier to adequate PO nutrition will be inattention.  No SLP f/u warranted; D/W Dr. Eliseo Squires.     Aspiration Risk  Mild    Diet Recommendation Regular;Thin liquid   Liquid Administration via: Cup;Straw Medication Administration: Whole meds with  liquid Supervision: Full supervision/cueing for compensatory strategies    Other  Recommendations Oral Care Recommendations: Oral care BID   Follow Up Recommendations  Skilled Nursing facility      Swallow Study Prior Functional Status       General HPI: 78 y.o. female SNF resident with a history of stroke, infection on multiple antibiotics and seizures admitted with concern for seizure.  MRI brain shows remote posterior left MCA infarct with encephalomalacia.  Pt with a baseline expressive/receptive aphasia; PEG in place.  Per report, pt was taking a PO diet at SNF with questions re: removal of PEG.   Pt had significant difficulty attending to and accepting POs yesterday.  Improved MS today with new orders to repeat eval.  Type of Study: Bedside swallow evaluation Previous Swallow Assessment: 07/18/14 Diet Prior to this Study: NPO Temperature Spikes Noted: No Respiratory Status: Room air History of Recent Intubation: No Behavior/Cognition: Alert;Confused;Requires cueing Oral Cavity - Dentition: Missing dentition Self-Feeding Abilities: Needs assist Patient Positioning: Upright in bed Baseline Vocal Quality: Clear Volitional Cough: Strong Volitional Swallow: Able to elicit    Oral/Motor/Sensory Function Overall Oral Motor/Sensory Function: Appears within functional limits for tasks assessed   Ice Chips Ice chips: Not tested   Thin Liquid Thin Liquid: Within functional limits Presentation: Cup;Straw    Nectar Thick Nectar Thick Liquid: Not tested   Honey Thick Honey Thick Liquid: Not tested   Puree Puree: Within functional limits   Solid   Ayriana Wix L. Tivis Ringer, [place] CCC/SLP Pager [phone]  Solid: Within functional limits       Juan Quam Laurice 07/19/2014,1:48 PM

## 2014-07-19 NOTE — Evaluation (Signed)
Physical Therapy Evaluation Patient Details Name: Tanya White MRN: [id number] DOB: June 08, 1936 Today's Date: 07/19/2014   History of Present Illness  78 yo female admitted from [facility] with LOC, slurred speech, R gaze. Pt (+) seizure on arrival. MRI(+) remote chronic hemorrhagic infarct left temporoparietal lobePMH: seizures, HTN, PNA, CVA, GIB, PE, memory loss, gastrostomy tube in place  Clinical Impression  Pt was seen for evaluation with effort to try to move her and assess deficits and safety.  Her overall presentation is with lack of safety awareness and obviously has been moving more independently in the past.  Plan to send back to her SNF as before to have PT there assess and treat.    Follow Up Recommendations SNF;Other (comment) (PLOF)    Equipment Recommendations  None recommended by PT    Recommendations for Other Services       Precautions / Restrictions Precautions Precautions: Fall Restrictions Weight Bearing Restrictions: No      Mobility  Bed Mobility Overal bed mobility: Modified Independent             General bed mobility comments: has used bedrail and mod I   Transfers Overall transfer level: Needs assistance Equipment used: 1 person hand held assist Transfers: Sit to/from [facility] Sit to Stand: Min assist Stand pivot transfers: Min assist       General transfer comment: Pt is slow to respond to stand request but at times stands unsafely alone  Ambulation/Gait Ambulation/Gait assistance: Min assist Ambulation Distance (Feet): 25 Feet Assistive device: 1 person hand held assist Gait Pattern/deviations: Step-through pattern;Decreased dorsiflexion - right;Decreased dorsiflexion - left;Decreased step length - right;Decreased step length - left;Shuffle;Drifts right/left;Narrow base of support Gait velocity: reduced Gait velocity interpretation: Below normal speed for age/gender General Gait Details: shuffled in room due to  urine incontinece so did not take her for a longer walk.  Pt is unaware of objects on her R side, and needs close S to keep from having contact on that side  Stairs            Wheelchair Mobility    Modified Rankin (Stroke Patients Only)       Balance Overall balance assessment: Needs assistance Sitting-balance support: Feet supported Sitting balance-Leahy Scale: Fair   Postural control: Right lateral lean;Posterior lean Standing balance support: Single extremity supported Standing balance-Leahy Scale: Poor Standing balance comment: tends to lean over but is able to stand upright, very distractible                             Pertinent Vitals/Pain Pain Assessment: No/denies pain    Home Living Family/patient expects to be discharged to:: Skilled nursing facility                      Prior Function Level of Independence: Needs assistance   Gait / Transfers Assistance Needed: unable to determine from pt  ADL's / Homemaking Assistance Needed: assisted        Hand Dominance        Extremity/Trunk Assessment   Upper Extremity Assessment: Overall WFL for tasks assessed           Lower Extremity Assessment: Generalized weakness      Cervical / Trunk Assessment: Normal  Communication   Communication: Other (comment) (pt is demented)  Cognition Arousal/Alertness: Awake/alert Behavior During Therapy: Impulsive;Restless Overall Cognitive Status: History of cognitive impairments - at baseline  Memory: Decreased recall of precautions;Decreased short-term memory              General Comments General comments (skin integrity, edema, etc.): Has overall distracted appearance and fleeting concentration on PT instructions.  Her baseline dementia vs now is diffcult to determine.  Up on pad to monitor safety in chair    Exercises General Exercises - Lower Extremity Ankle Circles/Pumps: Both;5 reps Quad Sets: Both;10 reps Hip  ABduction/ADduction: Both;10 reps      Assessment/Plan    PT Assessment Patient needs continued PT services  PT Diagnosis Difficulty walking   PT Problem List Decreased strength;Decreased range of motion;Decreased activity tolerance;Decreased balance;Decreased mobility;Decreased coordination;Decreased cognition;Decreased knowledge of use of DME;Decreased safety awareness  PT Treatment Interventions DME instruction;Gait training;Functional mobility training;Therapeutic activities;Therapeutic exercise;Neuromuscular re-education;Balance training   PT Goals (Current goals can be found in the Care Plan section) Acute Rehab PT Goals Patient Stated Goal: none stated PT Goal Formulation: Patient unable to participate in goal setting Time For Goal Achievement: 08/02/14 Potential to Achieve Goals: Good    Frequency Min 2X/week   Barriers to discharge Other (comment) (Pt lives in SNF and should be able to be seen by PT there)      Co-evaluation               End of Session Equipment Utilized During Treatment: Gait belt Activity Tolerance: Patient tolerated treatment well Patient left: in chair;with call bell/phone within reach;with chair alarm set Nurse Communication: Mobility status         Time: 1001-1021 PT Time Calculation (min) (ACUTE ONLY): 20 min   Charges:   PT Evaluation $Initial PT Evaluation Tier I: 1 Procedure     PT G CodesRamond White 2014-08-16, 11:12 AM   Tanya White, PT MS Acute Rehab Dept. Number: [phone]

## 2014-07-19 NOTE — Progress Notes (Signed)
Pt's family members requested that pt be full code. Nurse paged MD, Dr. Eliseo Squires and notified. Will monitor   Tanya White I 07/19/2014 4:34 PM

## 2014-07-19 NOTE — Progress Notes (Addendum)
PROGRESS NOTE  Tanya White BLT:[id number] DOB: 11-Aug-1936 DOA: 07/16/2014 PCP: [name], MD  Assessment/Plan: Seizure:  -CT had has no acute intracranial abnormalities.  -Neurology consulted: recently diagnosed infection at PEG site may have lowered seizure threshold.  S/p ativan EEG Increase keppra Increase vimpat Seizure precautions Continued antibiotic treatment.  MRI of the brain without contrast: chronic issues  History of pulmonary embolism: On Eliquis at home. No bleeding tendency -Continue Eliquis  Dysphagia: Secondary to previous stroke. Has PEG tube.  -SLP eval  Infection of PEG tube: She was started with Flagyl, rifampin and doxycycline on 11/28 in SNF -will continue all 3 antibiotics to complete her course of treatment (Flagyl for 7 days, rifampin for 5 days, doxycycline for 5 days). SLP eval- ? If patient passes eval, remove tube- failed first eval  D/c sitter  Code Status: DNR Family Communication: spoke with nephew 12/3, LM 12/4 Disposition Plan: tx to floor (med surge)   Consultants:  neuro  Procedures:      HPI/Subjective: Awake but confused- speaks but does not make sense  Objective: Filed Vitals:   07/19/14 0500  BP: 135/48  Pulse: 92  Temp: 98.3 F (36.8 C)  Resp: 16    Intake/Output Summary (Last 24 hours) at 07/19/14 0930 Last data filed at 07/19/14 0244  Gross per 24 hour  Intake    265 ml  Output    860 ml  Net   -595 ml   Filed Weights   07/18/14 1624 07/19/14 0500  Weight: 47.129 kg (103 lb 14.4 oz) 48.353 kg (106 lb 9.6 oz)    Exam:   General: confused, pleasant, NAD  Cardiovascular: rrr  Respiratory: clear, no wheezing  Abdomen: +BS, soft- PEG tube in place- foul smelling  Musculoskeletal: no edema   Data Reviewed: Basic Metabolic Panel:  Recent Labs Lab 07/16/14 2130 07/16/14 2143 07/17/14 0430 07/18/14 0345  NA 137 139 142 139  K 4.0 3.9 4.6 3.9  CL 101 103 110 107  CO2 24  --  22 22   GLUCOSE 140* 147* 94 94  BUN 24* 25* 17 8  CREATININE 0.59 0.60 0.64 0.54  CALCIUM 8.4  --  8.2* 8.3*   Liver Function Tests:  Recent Labs Lab 07/16/14 2130 07/17/14 0430  AST 12 13  ALT 7 7  ALKPHOS 104 94  BILITOT 0.2* <0.2*  PROT 6.9 6.2  ALBUMIN 3.0* 2.8*   No results for input(s): LIPASE, AMYLASE in the last 168 hours. No results for input(s): AMMONIA in the last 168 hours. CBC:  Recent Labs Lab 07/16/14 2130 07/16/14 2143 07/17/14 0430 07/18/14 0345  WBC 9.0  --  7.4 6.0  NEUTROABS 7.3  --   --   --   HGB 11.4* 12.9 10.9* 10.6*  HCT 32.9* 38.0 31.6* 30.9*  MCV 81.6  --  81.9 80.9  PLT 287  --  278 286   Cardiac Enzymes: No results for input(s): CKTOTAL, CKMB, CKMBINDEX, TROPONINI in the last 168 hours. BNP (last 3 results) No results for input(s): PROBNP in the last 8760 hours. CBG:  Recent Labs Lab 07/18/14 1412 07/18/14 2013 07/19/14 0021 07/19/14 0424 07/19/14 0733  GLUCAP 106* 92 100* 104* 109*    Recent Results (from the past 240 hour(s))  MRSA PCR Screening     Status: Abnormal   Collection Time: 07/17/14  6:50 AM  Result Value Ref Range Status   MRSA by PCR POSITIVE (A) NEGATIVE Final    Comment:  The GeneXpert MRSA Assay (FDA approved for NASAL specimens only), is one component of a comprehensive MRSA colonization surveillance program. It is not intended to diagnose MRSA infection nor to guide or monitor treatment for MRSA infections. RESULT CALLED TO, READ BACK BY AND VERIFIED WITH: Amedeo Plenty AT 2458 07/17/14 BY [place]      Studies: Mr Brain Wo Contrast  07/17/2014   CLINICAL DATA:  Seizure  EXAM: MRI HEAD WITHOUT CONTRAST  TECHNIQUE: Multiplanar, multiecho pulse sequences of the brain and surrounding structures were obtained without intravenous contrast.  COMPARISON:  CT head 07/16/2014  FINDINGS: Chronic hemorrhagic infarct in the left temporoparietal lobe with volume loss and encephalomalacia. Small area of chronic  ischemia in the left thalamus. Mild chronic microvascular ischemic changes in the white matter.  Negative for acute infarct.  Mild atrophy. Negative for mass or edema. Negative for hydrocephalus. Small area of chronic hemorrhage in the right parietal white matter.  Paranasal sinuses are clear.  IMPRESSION: Chronic hemorrhagic infarct left temporoparietal lobe. Mild chronic microvascular ischemic changes in the white matter. No acute abnormality.   Electronically Signed   By: Franchot Gallo M.D.   On: 07/17/2014 14:30    Scheduled Meds: . antiseptic oral rinse  7 mL Mouth Rinse q12n4p  . apixaban  5 mg Per Tube BID  . chlorhexidine  15 mL Mouth Rinse BID  . Chlorhexidine Gluconate Cloth  6 each Topical Q0600  . famotidine  20 mg Oral BID  . free water  150 mL Per Tube 4 times per day  . lacosamide  200 mg Per Tube BID  . levETIRAcetam  750 mg Oral BID  . magnesium oxide  400 mg Per Tube TID  . metoprolol tartrate  37.5 mg Per Tube BID  . metroNIDAZOLE  500 mg Oral BID  . multivitamin  5 mL Per Tube Daily  . mupirocin ointment  1 application Nasal BID  . sodium chloride  3 mL Intravenous Q12H   Continuous Infusions: . dextrose 5 % and 0.9% NaCl 75 mL/hr (07/18/14 1423)  . feeding supplement (GLUCERNA 1.2 CAL) 1,000 mL (07/19/14 0252)   Antibiotics Given (last 72 hours)    Date/Time Action Medication Dose   07/17/14 1037 Given   doxycycline (VIBRA-TABS) tablet 100 mg 100 mg   07/17/14 1037 Given   metroNIDAZOLE (FLAGYL) tablet 500 mg 500 mg   07/17/14 1038 Given   rifampin (RIFADIN) capsule 300 mg 300 mg   07/17/14 2225 Given   doxycycline (VIBRA-TABS) tablet 100 mg 100 mg   07/17/14 2225 Given   metroNIDAZOLE (FLAGYL) tablet 500 mg 500 mg   07/17/14 2225 Given   rifampin (RIFADIN) capsule 300 mg 300 mg   07/18/14 1035 Given   metroNIDAZOLE (FLAGYL) tablet 500 mg 500 mg   07/18/14 2211 Given   metroNIDAZOLE (FLAGYL) tablet 500 mg 500 mg      Principal Problem:   Status  epilepticus Active Problems:   Seizure   CVA (cerebral infarction)   Stroke   GI bleed   Pulmonary embolism   Gastrostomy tube in place   Dysphasia    Time spent: 25 min    Dvaughn Fickle  [facility] Pager [phone]. If 7PM-7AM, please contact night-coverage at [email or web address], password [facility] 07/19/2014, 9:30 AM  LOS: 3 days

## 2014-07-19 NOTE — Progress Notes (Signed)
Medicare Important Message given? YES  (If response is "NO", the following Medicare IM given date fields will be blank)  Date Medicare IM given:  07/19/2014 Medicare IM given by: Lenox Ponds RN CCM Case Mgmt phone [phone]

## 2014-07-20 LAB — URINE CULTURE: Colony Count: >=100000

## 2014-07-20 LAB — GLUCOSE, CAPILLARY
GLUCOSE-CAPILLARY: 109 mg/dL — AB (ref 70–99)
GLUCOSE-CAPILLARY: 108 mg/dL — AB (ref 70–99)
GLUCOSE-CAPILLARY: 93 mg/dL (ref 70–99)
GLUCOSE-CAPILLARY: 106 mg/dL — AB (ref 70–99)
Glucose-Capillary: 128 mg/dL — ABNORMAL HIGH (ref 70–99)
Glucose-Capillary: 116 mg/dL — ABNORMAL HIGH (ref 70–99)
Glucose-Capillary: 96 mg/dL (ref 70–99)

## 2014-07-20 MED ORDER — LACOSAMIDE 50 MG PO TABS
200.0000 mg | ORAL_TABLET | Freq: Two times a day (BID) | ORAL | Status: DC
  Administered 2014-07-20 – 2014-07-22 (×4): 200 mg via ORAL
  Filled 2014-07-20 (×4): qty 4

## 2014-07-20 MED ORDER — ADULT MULTIVITAMIN LIQUID CH
5.0000 mL | Freq: Every day | ORAL | Status: DC
  Administered 2014-07-21 – 2014-07-22 (×2): 5 mL via ORAL
  Filled 2014-07-20 (×2): qty 5

## 2014-07-20 MED ORDER — APIXABAN 5 MG PO TABS
5.0000 mg | ORAL_TABLET | Freq: Two times a day (BID) | ORAL | Status: DC
  Administered 2014-07-20 – 2014-07-22 (×4): 5 mg via ORAL
  Filled 2014-07-20 (×4): qty 1

## 2014-07-20 MED ORDER — HALOPERIDOL LACTATE 2 MG/ML PO CONC
2.0000 mg | Freq: Four times a day (QID) | ORAL | Status: DC | PRN
  Administered 2014-07-21: 2 mg via ORAL
  Filled 2014-07-20 (×2): qty 1

## 2014-07-20 MED ORDER — BACITRACIN-NEOMYCIN-POLYMYXIN OINTMENT TUBE
TOPICAL_OINTMENT | Freq: Two times a day (BID) | CUTANEOUS | Status: DC
  Administered 2014-07-20 – 2014-07-22 (×4): via TOPICAL
  Filled 2014-07-20 (×2): qty 15

## 2014-07-20 MED ORDER — LORAZEPAM 0.5 MG PO TABS
0.5000 mg | ORAL_TABLET | Freq: Four times a day (QID) | ORAL | Status: DC | PRN
  Administered 2014-07-22: 0.5 mg via ORAL
  Filled 2014-07-20: qty 1

## 2014-07-20 MED ORDER — METOPROLOL TARTRATE 25 MG PO TABS
37.5000 mg | ORAL_TABLET | Freq: Two times a day (BID) | ORAL | Status: DC
  Administered 2014-07-20 – 2014-07-22 (×4): 37.5 mg via ORAL
  Filled 2014-07-20 (×8): qty 1

## 2014-07-20 MED ORDER — ONDANSETRON HCL 4 MG PO TABS: 4.0000 mg | ORAL_TABLET | ORAL | Status: DC | PRN

## 2014-07-20 MED ORDER — MAGNESIUM OXIDE 400 (241.3 MG) MG PO TABS
400.0000 mg | ORAL_TABLET | Freq: Three times a day (TID) | ORAL | Status: DC
  Administered 2014-07-20 – 2014-07-22 (×7): 400 mg via ORAL
  Filled 2014-07-20 (×7): qty 1

## 2014-07-20 NOTE — Plan of Care (Signed)
Problem: Phase III Progression Outcomes Goal: Seizure activity resolved/baseline Outcome: Completed/Met Date Met:  07/20/14 Goal: Pain controlled on oral analgesia Outcome: Completed/Met Date Met:  07/20/14 Goal: Activity at appropriate level-compared to baseline (UP IN CHAIR FOR HEMODIALYSIS)  Outcome: Completed/Met Date Met:  07/20/14 Goal: Tolerating diet Outcome: Completed/Met Date Met:  07/20/14 Goal: IV meds to PO Outcome: Completed/Met Date Met:  07/20/14 Goal: Discharge plan remains appropriate-arrangements made Outcome: Completed/Met Date Met:  07/20/14

## 2014-07-20 NOTE — Progress Notes (Signed)
[facility] PROGRESS NOTE  Tanya White UJW:[id number] DOB: 1936/02/23 DOA: 07/16/2014  PCP: [name], MD  Brief HPI: This is a 78 year old African-American female who was brought in due to seizures. She has a past medical history of stroke with a history of dysphagia with PEG tube, history of pulmonary embolism on anticoagulation. Patient was seen by neurology. Her medications were adjusted. She has slowly improved.  Past medical history:  Past Medical History  Diagnosis Date  . Seizures   . Hypotension   . Pneumonia   . Stroke   . GI bleed   . Pulmonary embolism   . Gastrostomy tube in place   . Memory loss     Consultants: Neurology  Procedures:  EEG Report 12/2 Clinical Interpretation: This EEG is most consistent with a mild generalized encephalopathy. There was no seizure or seizure predisposition recorded on this study.   Antibiotics: Off of all antibiotics including Flagyl, doxycycline, rifampin.  Subjective: Patient is alert. She is cooperative. However, she at times has nonsensical speech. Unclear if this is close to her baseline or not.  Objective: Vital Signs  Filed Vitals:   07/19/14 2100 07/20/14 0115 07/20/14 0500 07/20/14 0910  BP: 154/69 148/67 152/69 129/63  Pulse: 98 89 91 106  Temp: 98.2 F (36.8 C) 98.3 F (36.8 C) 98.2 F (36.8 C) 98.9 F (37.2 C)  TempSrc: Oral Oral Oral Oral  Resp: 16 18 18 20   Height:      Weight:   48.127 kg (106 lb 1.6 oz)   SpO2: 99% 99% 99% 100%    Intake/Output Summary (Last 24 hours) at 07/20/14 1131 Last data filed at 07/19/14 1900  Gross per 24 hour  Intake    300 ml  Output      0 ml  Net    300 ml   Filed Weights   07/19/14 0500 07/19/14 1500 07/20/14 0500  Weight: 48.353 kg (106 lb 9.6 oz) 48.3 kg (106 lb 7.7 oz) 48.127 kg (106 lb 1.6 oz)    General appearance: alert, cooperative, appears stated age, distracted and no distress Head: Normocephalic, without obvious abnormality,  atraumatic Resp: clear to auscultation bilaterally Cardio: regular rate and rhythm, S1, S2 normal, no murmur, click, rub or gallop GI: soft, non-tender; bowel sounds normal; no masses,  no organomegaly. PEG tube was noted. There is some yellowish crusts around the insertion site. Extremities: extremities normal, atraumatic, no cyanosis or edema Skin: Skin color, texture, turgor normal. No rashes or lesions Neurologic: She is alert. No facial droop. She follows commands. However, not all the things that she is stating makes sense. She is moving all her extremities.  Lab Results:  Basic Metabolic Panel:  Recent Labs Lab 07/16/14 2130 07/16/14 2143 07/17/14 0430 07/18/14 0345  NA 137 139 142 139  K 4.0 3.9 4.6 3.9  CL 101 103 110 107  CO2 24  --  22 22  GLUCOSE 140* 147* 94 94  BUN 24* 25* 17 8  CREATININE 0.59 0.60 0.64 0.54  CALCIUM 8.4  --  8.2* 8.3*   Liver Function Tests:  Recent Labs Lab 07/16/14 2130 07/17/14 0430  AST 12 13  ALT 7 7  ALKPHOS 104 94  BILITOT 0.2* <0.2*  PROT 6.9 6.2  ALBUMIN 3.0* 2.8*   CBC:  Recent Labs Lab 07/16/14 2130 07/16/14 2143 07/17/14 0430 07/18/14 0345  WBC 9.0  --  7.4 6.0  NEUTROABS 7.3  --   --   --   HGB  11.4* 12.9 10.9* 10.6*  HCT 32.9* 38.0 31.6* 30.9*  MCV 81.6  --  81.9 80.9  PLT 287  --  278 286   CBG:  Recent Labs Lab 07/19/14 0733 07/19/14 2010 07/20/14 0112 07/20/14 0448 07/20/14 1111  GLUCAP 109* 118* 108* 116* 96    Recent Results (from the past 240 hour(s))  MRSA PCR Screening     Status: Abnormal   Collection Time: 07/17/14  6:50 AM  Result Value Ref Range Status   MRSA by PCR POSITIVE (A) NEGATIVE Final    Comment:        The GeneXpert MRSA Assay (FDA approved for NASAL specimens only), is one component of a comprehensive MRSA colonization surveillance program. It is not intended to diagnose MRSA infection nor to guide or monitor treatment for MRSA infections. RESULT CALLED TO, READ BACK  BY AND VERIFIED WITH: Amedeo Plenty AT 1540 07/17/14 BY K BARR   Culture, Urine     Status: None   Collection Time: 07/18/14  8:25 AM  Result Value Ref Range Status   Specimen Description URINE, CATHETERIZED  Final   Special Requests NONE  Final   Culture  Setup Time   Final    07/18/2014 15:53 Performed at [place]   Final    >=100,000 COLONIES/ML Performed at [facility]    Culture   Final    Multiple bacterial morphotypes present, none predominant. Suggest appropriate recollection if clinically indicated. Performed at [facility]    Report Status 07/20/2014 FINAL  Final      Studies/Results: No results found.  Medications:  Scheduled: . antiseptic oral rinse  7 mL Mouth Rinse q12n4p  . apixaban  5 mg Oral BID  . chlorhexidine  15 mL Mouth Rinse BID  . Chlorhexidine Gluconate Cloth  6 each Topical Q0600  . famotidine  20 mg Oral BID  . Influenza vac split quadrivalent PF  0.5 mL Intramuscular Tomorrow-1000  . lacosamide  200 mg Oral BID  . levETIRAcetam  750 mg Oral BID  . magnesium oxide  400 mg Oral TID  . metoprolol tartrate  37.5 mg Oral BID  . [START ON 07/21/2014] multivitamin  5 mL Oral Daily  . mupirocin ointment  1 application Nasal BID  . neomycin-bacitracin-polymyxin   Topical BID  . sodium chloride  3 mL Intravenous Q12H   Continuous:  [id number], albuterol, haloperidol, LORazepam, ondansetron  Assessment/Plan:  Principal Problem:   Status epilepticus Active Problems:   Seizure   CVA (cerebral infarction)   Stroke   Pulmonary embolism   Gastrostomy tube in place   Dysphasia    Seizure disorder  Evaluation has been unremarkable so far including CT head, MRI, EEG. Infection could have lowered her seizure threshold. Dose of Keppra and Vimpat was increased. Neurology has signed off. She hasn't had any seizures in the last 24-48 hours.   Urinary tract infection. Urine culture reports  multiple bacteria. She has finished a course of antibiotics.  History of pulmonary embolism On Eliquis. No bleeding tendency.   Dysphagia Secondary to previous stroke. Has PEG tube. But apparently has been eating by mouth at the skilled nursing facility for the last several weeks. She had a swallow eval yesterday. She was cleared for a regular diet. I had a long discussion with the patient's nephew, who is also the healthcare power of attorney. Apparently, plan was for the PEG tube to be removed this Wednesday due to infection  issues. Especially because she has been tolerating orally. The plan will be to change all her medicines to take by mouth. And if she tolerates all this quite well tomorrow we will contact IR to have the PEG tube removed.  Infection of PEG tube insertion site She was started with Flagyl, rifampin and doxycycline on 11/28 in SNF. Apparently had MRSA infection. She's completed course of antibiotics. If she tolerates medications orally PEG tube will be removed prior to discharge.  History of stroke. MRI done during this hospitalization did not show acute stroke. Continue with anticoagulation. Continue with physical therapy. She will likely go back to skilled nursing facility at discharge.  DVT Prophylaxis: She is on full anticoagulation    Code Status: CODE STATUS was discussed with the power of attorney, her nephew, Tanya White. She was apparently made DO NOT RESUSCITATE in [place] over the summer. Apparently patient communicated to family that she wants this revoked. The healthcare power of attorney will discuss this issue with the rest of the family members and advice [place] regarding the same.  Family Communication: Discussed with her nephew, Tanya White  Disposition Plan: Will return to skilled nursing facility hopefully, by Monday.    LOS: 4 days   [facility] Pager [phone] 07/20/2014, 11:31 AM  If 8PM-8AM, please contact night-coverage at  [email or web address], password [facility]

## 2014-07-21 LAB — BASIC METABOLIC PANEL
Anion gap: 8 (ref 5–15)
BUN: 8 mg/dL (ref 6–23)
CHLORIDE: 107 meq/L (ref 96–112)
CO2: 27 meq/L (ref 19–32)
CREATININE: 0.7 mg/dL (ref 0.50–1.10)
Calcium: 8.6 mg/dL (ref 8.4–10.5)
GFR calc Af Amer: >90 mL/min (ref >90)
GFR calc non Af Amer: 82 mL/min — ABNORMAL LOW (ref >90)
GLUCOSE: 96 mg/dL (ref 70–99)
Potassium: 4.4 mEq/L (ref 3.7–5.3)
Sodium: 142 mEq/L (ref 137–147)

## 2014-07-21 LAB — GLUCOSE, CAPILLARY
GLUCOSE-CAPILLARY: 101 mg/dL — AB (ref 70–99)
Glucose-Capillary: 87 mg/dL (ref 70–99)
Glucose-Capillary: 100 mg/dL — ABNORMAL HIGH (ref 70–99)
Glucose-Capillary: 98 mg/dL (ref 70–99)

## 2014-07-21 LAB — CBC
HEMATOCRIT: 32.1 % — AB (ref 36.0–46.0)
HEMOGLOBIN: 11.4 g/dL — AB (ref 12.0–15.0)
MCH: 29.2 pg (ref 26.0–34.0)
MCHC: 35.5 g/dL (ref 30.0–36.0)
MCV: 82.1 fL (ref 78.0–100.0)
Platelets: 277 10*3/uL (ref 150–400)
RBC: 3.91 MIL/uL (ref 3.87–5.11)
RDW: 15.3 % (ref 11.5–15.5)
WBC: 4.4 10*3/uL (ref 4.0–10.5)

## 2014-07-21 MED ORDER — LEVETIRACETAM 100 MG/ML PO SOLN
750.0000 mg | Freq: Two times a day (BID) | ORAL | Status: DC
  Administered 2014-07-21 – 2014-07-22 (×2): 750 mg via ORAL
  Filled 2014-07-21 (×3): qty 7.5

## 2014-07-21 NOTE — Progress Notes (Signed)
Utilization review complete. Trianna Lupien RN CCM Case Mgmt phone [phone] 

## 2014-07-21 NOTE — Clinical Social Work Psychosocial (Signed)
     Clinical Social Work Department BRIEF PSYCHOSOCIAL ASSESSMENT 07/21/2014  Patient:  Tanya White,Tanya White     Account Number:  [id number]     Admit date:  07/16/2014  Clinical Social Worker:  Adair Laundry  Date/Time:  07/21/2014 10:34 AM  Referred by:  Physician  Date Referred:  07/21/2014 Referred for  SNF Placement   Other Referral:   Interview type:  Family Other interview type:   Spoke with pt daughter Tanya White over the phone    PSYCHOSOCIAL DATA Living Status:  FACILITY Admitted from facility:  [place], [place] Level of care:  [place] Primary support name:  Tanya White Primary support relationship to patient:  CHILD, ADULT Degree of support available:   Pt has strong family support    CURRENT CONCERNS Current Concerns  Post-Acute Placement   Other Concerns:    SOCIAL WORK ASSESSMENT / PLAN CSW made aware pt was admitted from facility. CSW noted that pt is documented as Ox[facility] so CSW called pt daughter Tanya White to complete assessment. Tanya White confirmed pt was admitted from [facility]. Tanya informed CSW pt is long term at facility and has been there since August. Tanya reports that facility is excellent in caring for pt. Tanya White stated that the facility is supportive and she has no complaints. Plan will be for pt to return to SNF at dc. Tanya White wanting updates on pt medically, CSW provided Tanya with unit phone number.   Assessment/plan status:  Psychosocial Support/Ongoing Assessment of Needs Other assessment/ plan:   Information/referral to community resources:   None needed at this time    PATIENTS/FAMILYS RESPONSE TO PLAN OF CARE: Pt daughter pleasant and cooperative. She reports plan will be for pt to return to SNF at [facility], [place]  [place]

## 2014-07-21 NOTE — Progress Notes (Signed)
CARE MANAGEMENT NOTE 07/21/2014  Patient:  Lippmann,Avonlea   Account Number:  [id number]  Date Initiated:  07/17/2014  Documentation initiated by:  MAYO,HENRIETTA  Subjective/Objective Assessment:   dx status epilepticus; pt is resident of [facility]     Action/Plan:   Anticipated DC Date:  07/22/2014   Anticipated DC Plan:  [place]  In-house referral  Clinical Social Worker      DC Planning Services  CM consult      Choice offered to / List presented to:             Status of service:  Completed, signed off Medicare Important Message given?  YES (If response is "NO", the following Medicare IM given date fields will be blank) Date Medicare IM given:  07/19/2014 Medicare IM given by:  [facility] Date Additional Medicare IM given:   Additional Medicare IM given by:    Discharge Disposition:  [place]  Per UR Regulation:    If discussed at Long Length of Stay Meetings, dates discussed:    Comments:  07/19/2014 Planned dc to SNF. Chart reviewed. Jonnie Finner RN CCM Case Mgmt phone [phone]

## 2014-07-21 NOTE — Progress Notes (Addendum)
[facility] PROGRESS NOTE  Tanya White MBW:[id number] DOB: June 25, 1936 DOA: 07/16/2014  PCP: [name], MD  Brief HPI: This is a 78 year old African-American female who was brought in due to seizures. She has a past medical history of stroke with a history of dysphagia with PEG tube, history of pulmonary embolism on anticoagulation. Patient was seen by neurology. Her medications were adjusted. She has slowly improved.  Past medical history:  Past Medical History  Diagnosis Date  . Seizures   . Hypotension   . Pneumonia   . Stroke   . GI bleed   . Pulmonary embolism   . Gastrostomy tube in place   . Memory loss     Consultants: Neurology  Procedures:  EEG Report 12/2 Clinical Interpretation: This EEG is most consistent with a mild generalized encephalopathy. There was no seizure or seizure predisposition recorded on this study.   Antibiotics: Off of all antibiotics including Flagyl, doxycycline, rifampin.  Subjective: Patient is alert. Continues to have nonsensical speech.   Objective: Vital Signs  Filed Vitals:   07/20/14 1655 07/20/14 2139 07/21/14 0218 07/21/14 0711  BP: 138/87 131/57 120/80 134/60  Pulse: 124 105 90 79  Temp: 98.1 F (36.7 C) 98.1 F (36.7 C) 98 F (36.7 C) 98 F (36.7 C)  TempSrc: Oral Oral Oral Oral  Resp: 20 18 18 18   Height:      Weight:    46.857 kg (103 lb 4.8 oz)  SpO2: 100% 96% 98% 98%   No intake or output data in the 24 hours ending 07/21/14 0808 Filed Weights   07/19/14 1500 07/20/14 0500 07/21/14 0711  Weight: 48.3 kg (106 lb 7.7 oz) 48.127 kg (106 lb 1.6 oz) 46.857 kg (103 lb 4.8 oz)    General appearance: alert, cooperative, appears stated age, distracted and no distress Resp: clear to auscultation bilaterally Cardio: regular rate and rhythm, S1, S2 normal, no murmur, click, rub or gallop GI: soft, non-tender; bowel sounds normal; no masses,  no organomegaly. PEG tube was noted. There is some yellowish crusts  around the insertion site. Neurologic: She is alert. No facial droop. She follows commands. However, not all the things that she is stating makes sense. She is moving all her extremities.  Lab Results:  Basic Metabolic Panel:  Recent Labs Lab 07/16/14 2130 07/16/14 2143 07/17/14 0430 07/18/14 0345 07/21/14 0611  NA 137 139 142 139 142  K 4.0 3.9 4.6 3.9 4.4  CL 101 103 110 107 107  CO2 24  --  22 22 27   GLUCOSE 140* 147* 94 94 96  BUN 24* 25* 17 8 8   CREATININE 0.59 0.60 0.64 0.54 0.70  CALCIUM 8.4  --  8.2* 8.3* 8.6   Liver Function Tests:  Recent Labs Lab 07/16/14 2130 07/17/14 0430  AST 12 13  ALT 7 7  ALKPHOS 104 94  BILITOT 0.2* <0.2*  PROT 6.9 6.2  ALBUMIN 3.0* 2.8*   CBC:  Recent Labs Lab 07/16/14 2130 07/16/14 2143 07/17/14 0430 07/18/14 0345 07/21/14 0611  WBC 9.0  --  7.4 6.0 4.4  NEUTROABS 7.3  --   --   --   --   HGB 11.4* 12.9 10.9* 10.6* 11.4*  HCT 32.9* 38.0 31.6* 30.9* 32.1*  MCV 81.6  --  81.9 80.9 82.1  PLT 287  --  278 286 277   CBG:  Recent Labs Lab 07/20/14 0448 07/20/14 1111 07/20/14 1633 07/20/14 2223 07/21/14 0649  GLUCAP 116* 96 93 106* 87  Recent Results (from the past 240 hour(s))  MRSA PCR Screening     Status: Abnormal   Collection Time: 07/17/14  6:50 AM  Result Value Ref Range Status   MRSA by PCR POSITIVE (A) NEGATIVE Final    Comment:        The GeneXpert MRSA Assay (FDA approved for NASAL specimens only), is one component of a comprehensive MRSA colonization surveillance program. It is not intended to diagnose MRSA infection nor to guide or monitor treatment for MRSA infections. RESULT CALLED TO, READ BACK BY AND VERIFIED WITH: Amedeo Plenty AT 5176 07/17/14 BY K BARR   Culture, Urine     Status: None   Collection Time: 07/18/14  8:25 AM  Result Value Ref Range Status   Specimen Description URINE, CATHETERIZED  Final   Special Requests NONE  Final   Culture  Setup Time   Final    07/18/2014  15:53 Performed at [facility]   Final    >=100,000 COLONIES/ML Performed at [facility]    Culture   Final    Multiple bacterial morphotypes present, none predominant. Suggest appropriate recollection if clinically indicated. Performed at [facility]    Report Status 07/20/2014 FINAL  Final      Studies/Results: No results found.  Medications:  Scheduled: . antiseptic oral rinse  7 mL Mouth Rinse q12n4p  . apixaban  5 mg Oral BID  . chlorhexidine  15 mL Mouth Rinse BID  . Chlorhexidine Gluconate Cloth  6 each Topical Q0600  . famotidine  20 mg Oral BID  . Influenza vac split quadrivalent PF  0.5 mL Intramuscular Tomorrow-1000  . lacosamide  200 mg Oral BID  . levETIRAcetam  750 mg Oral BID  . magnesium oxide  400 mg Oral TID  . metoprolol tartrate  37.5 mg Oral BID  . multivitamin  5 mL Oral Daily  . mupirocin ointment  1 application Nasal BID  . neomycin-bacitracin-polymyxin   Topical BID  . sodium chloride  3 mL Intravenous Q12H   Continuous:  [id number], albuterol, haloperidol, LORazepam, ondansetron  Assessment/Plan:  Principal Problem:   Status epilepticus Active Problems:   Seizure   CVA (cerebral infarction)   Stroke   Pulmonary embolism   Gastrostomy tube in place   Dysphasia    Seizure disorder  Remains without seizures. Evaluation has been unremarkable so far including CT head, MRI, EEG. Infection could have lowered her seizure threshold. Dose of Keppra and Vimpat was increased. Neurology has signed off.   Urinary tract infection. Urine culture reports multiple bacteria. She has finished a course of antibiotics.  History of pulmonary embolism On Eliquis. No bleeding tendency.   Dysphagia Secondary to previous stroke. Has PEG tube. But apparently has been eating by mouth at the skilled nursing facility for the last several weeks. She had a swallow eval 12/4. She was cleared for a regular diet. I  had a long discussion with the patient's nephew, who is also the healthcare power of attorney. Apparently, plan was for the PEG tube to be removed this Wednesday due to infection issues. Especially because she has been tolerating orally. The plan will be to change all her medicines to take by mouth. Calorie count also in progress. If she is eating adequately by mouth and able to swallow her medications we can have IR remove the PEG tube.   Infection of PEG tube insertion site She was started with Flagyl, rifampin and doxycycline  on 11/28 in SNF. Apparently had MRSA infection. She's completed course of antibiotics. If she tolerates medications orally and is taking appropriate amount of calories by mouth PEG tube can be removed prior to discharge.  History of stroke. MRI done during this hospitalization did not show acute stroke. Continue with anticoagulation. Continue with physical therapy. She will likely go back to skilled nursing facility at discharge.  DVT Prophylaxis: She is on full anticoagulation    Code Status: CODE STATUS was discussed with the power of attorney, her nephew, Mr. Skeet Simmer. She was apparently made DO NOT RESUSCITATE in [place] over the summer. Apparently patient communicated to family that she wants this revoked. The healthcare power of attorney will discuss this issue with the rest of the family members and advice [place] regarding the same.  Family Communication: Discussed with her nephew, Mr. Skeet Simmer 12/5. Disposition Plan: Will return to skilled nursing facility hopefully, by Monday or Tuesday.  ADDENDUM THE HCPOA (MR. MARSH) DID ARRIVE AND HAS DECIDED TO CHANGE HER CODE STATUS TO FULL CODE. WILL CHANGE ORDER. COPY OF PAPERWORK TO BE PLACED IN THE CHART.   LOS: 5 days   [facility] Pager [phone] 07/21/2014, 8:08 AM  If 8PM-8AM, please contact night-coverage at [email or web address], password [facility]

## 2014-07-22 LAB — GLUCOSE, CAPILLARY
GLUCOSE-CAPILLARY: 114 mg/dL — AB (ref 70–99)
GLUCOSE-CAPILLARY: 94 mg/dL (ref 70–99)

## 2014-07-22 MED ORDER — ACETAMINOPHEN 325 MG PO TABS: 650.0000 mg | ORAL_TABLET | Freq: Four times a day (QID) | ORAL | Status: DC | PRN

## 2014-07-22 MED ORDER — METOPROLOL TARTRATE 12.5 MG HALF TABLET: 37.5000 mg | ORAL_TABLET | Freq: Two times a day (BID) | ORAL | Status: DC

## 2014-07-22 MED ORDER — FERROUS SULFATE 325 (65 FE) MG PO TABS: 325.0000 mg | ORAL_TABLET | Freq: Every day | ORAL | Status: AC

## 2014-07-22 MED ORDER — FUROSEMIDE 40 MG PO TABS: 40.0000 mg | ORAL_TABLET | Freq: Every day | ORAL | Status: AC | PRN

## 2014-07-22 MED ORDER — LEVETIRACETAM 750 MG PO TABS: 750.0000 mg | ORAL_TABLET | Freq: Two times a day (BID) | ORAL | Status: DC

## 2014-07-22 MED ORDER — LORAZEPAM 0.5 MG PO TABS: 0.5000 mg | ORAL_TABLET | Freq: Four times a day (QID) | ORAL | Status: DC | PRN

## 2014-07-22 MED ORDER — APIXABAN 5 MG PO TABS: 5.0000 mg | ORAL_TABLET | Freq: Two times a day (BID) | ORAL | Status: DC

## 2014-07-22 MED ORDER — BACITRACIN-NEOMYCIN-POLYMYXIN OINTMENT TUBE: 1.0000 "application " | TOPICAL_OINTMENT | Freq: Two times a day (BID) | CUTANEOUS | Status: DC

## 2014-07-22 MED ORDER — ONDANSETRON HCL 4 MG PO TABS: 4.0000 mg | ORAL_TABLET | ORAL | Status: AC | PRN

## 2014-07-22 MED ORDER — LACOSAMIDE 200 MG PO TABS: 200.0000 mg | ORAL_TABLET | Freq: Two times a day (BID) | ORAL | Status: DC

## 2014-07-22 MED ORDER — MAGNESIUM OXIDE 400 MG PO TABS: 400.0000 mg | ORAL_TABLET | Freq: Three times a day (TID) | ORAL | Status: DC

## 2014-07-22 MED ORDER — POTASSIUM CHLORIDE CRYS ER 20 MEQ PO TBCR: 20.0000 meq | EXTENDED_RELEASE_TABLET | Freq: Every day | ORAL | Status: AC | PRN

## 2014-07-22 MED ORDER — ENSURE COMPLETE PO LIQD: 237.0000 mL | Freq: Two times a day (BID) | ORAL | Status: DC

## 2014-07-22 MED ORDER — ENSURE COMPLETE PO LIQD: 237.0000 mL | Freq: Three times a day (TID) | ORAL | Status: DC

## 2014-07-22 MED ORDER — ADULT MULTIVITAMIN LIQUID CH: 5.0000 mL | Freq: Every day | ORAL | Status: DC

## 2014-07-22 NOTE — Progress Notes (Signed)
Discharge orders received.  Report called to [facility].  Pt dressed and belongings gathered.  Awaiting [facility] for transport.  Will continue to monitor. Cori Razor, RN

## 2014-07-22 NOTE — Progress Notes (Signed)
Calorie Count Note  48 hour calorie count ordered and now complete  Diet: Heart Healthy Diet Supplements: None  Calorie Count over the weekend. Only 3 meals documented with intake at meals ranging from 75 kcal to 350 kcal. Sometimes patient is eating 75% of all food but, sometimes she is only eating a half of a mini bagel. At time of visit pt seemed confused, putting her napkin in her mouth and ripping pieces off with her teeth. She reports having a good appetite and eating well. She is agreeable to trying Ensure Complete to supplement meals; if patient can drink 2 bottles daily in addition to meals she should be able to meet energy and protein intake. Discussed patient with Dr. [place] Pink. Due to patient's varied PO intake and underweight status, plan to leave PEG in for now and see how patient does at SNF.   Breakfast 12/5: 120 kcal, 5 grams of protein Breakfast 12/6: 351 kcal, 20 grams of protein Breakfast 12/7: 75 kcal, 2 grams of protein Dinner 12/4: 229 kcal, 13 grams protein  Estimated daily intake: 800 kcal (57% of minimum estimated needs)  38 grams protein (58% of minimum estimated needs)  Nutrition Dx: Inadequate oral intake related to medical condition/advanced age as evidenced by PO intake meeting < 75% of estimated energy and protein needs.   Goal: Pt to meet >/= 90% of their estimated nutrition needs   Intervention:  Provide Ensure Complete BID, each supplement provides 350 kcal and 13 grams of protein.  Recommend liberalizing diet to Regular Encourage PO intake  Pryor Ochoa RD, LDN Inpatient Clinical Dietitian Pager: [phone] After Hours Pager: [phone]

## 2014-07-22 NOTE — Discharge Summary (Addendum)
[facility]  Physician Discharge Summary   Patient ID: Tanya White MRN: [id number] DOB/AGE: 1936/03/12 78 y.o.  Admit date: 07/16/2014 Discharge date: 07/22/2014  PCP: [name], MD  DISCHARGE DIAGNOSES:  Principal Problem:   Status epilepticus Active Problems:   Seizure   CVA (cerebral infarction)   Stroke   Pulmonary embolism   Gastrostomy tube in place   Dysphasia   RECOMMENDATIONS FOR OUTPATIENT FOLLOW UP: 1. PLEASE NOTE THAT DNR HAS BEEN REVOKED BY [facility]. PATIENT IS FULL CODE 2. Please do calorie count at SNF in a few days. 3. Please assist with feeds as much as possible 4. Continue PEG care.  DISCHARGE CONDITION: fair  Diet recommendation: Heart Healthy  Filed Weights   07/19/14 1500 07/20/14 0500 07/21/14 0711  Weight: 48.3 kg (106 lb 7.7 oz) 48.127 kg (106 lb 1.6 oz) 46.857 kg (103 lb 4.8 oz)    INITIAL HISTORY: This is a 78 year old African-American female who was brought in due to seizures. She has a past medical history of stroke with a history of dysphagia with PEG tube, history of pulmonary embolism on anticoagulation. Patient was seen by neurology. Her medications were adjusted. She has slowly improved.  Consultants: Neurology  Procedures:  EEG Report 12/2 Clinical Interpretation: This EEG is most consistent with a mild generalized encephalopathy. There was no seizure or seizure predisposition recorded on this study.   HOSPITAL COURSE:   Seizure disorder  Her antiepileptic medication doses were adjusted. EEG was done, as discussed above. Other evaluation has been unremarkable so far including CT head, MRI. Infection could have lowered her seizure threshold. She has remained without seizures for the last 4 days.  Urinary tract infection. Urine culture reports multiple bacteria. She has finished a course of antibiotics.  History of pulmonary embolism She remains on Eliquis. No bleeding tendency.   Dysphagia This is secondary to  previous stroke. She has a PEG tube. But apparently has been eating by mouth at the skilled nursing facility for the last several weeks. She had a swallow eval 12/4. She was cleared for a regular diet. I had a long discussion with the patient's nephew, who is also the healthcare power of attorney. Apparently, plan was for the PEG tube to be removed this Wednesday due to infection issues. Especially because she has been tolerating orally. All her medications were changed to oral. Calorie count was done this weekend. Unfortunately, patient has shown very inconsistent oral intake. Hence, she is not meeting her for nutritional requirements at this time. Ensure between meals may provide the additional calories that she needs. However, at this time, I cannot recommend taking the tube out without knowing that she will be able to provide sufficient calories by eating orally. So the plan is to leave the PEG tube in. Repeat calorie count at the skilled nursing facility will be recommended. She should be assisted with her meals. Ensure in between meals. If she demonstrates adequate nutritional intake then at that time, the PEG tube can be removed.   Infection of PEG tube insertion site This has improved. Local antibiotic ointment. She was also started with Flagyl, rifampin and doxycycline on 11/28 in SNF. Apparently had MRSA infection. She's completed course of antibiotics.  History of stroke. MRI done during this hospitalization did not show acute stroke. Continue with anticoagulation. Continue with physical therapy. She will go back to skilled nursing facility at discharge.  Code Status: Healthcare POA, Mr. Skeet Simmer, after discussions with the patient's other family members has revoked the patient's  DNR status. SHE IS A FULL CODE NOW.   She is stable for discharge. She can return to skilled nursing facility.   PERTINENT LABS:  The results of significant diagnostics from this hospitalization (including imaging,  microbiology, ancillary and laboratory) are listed below for reference.    Microbiology: Recent Results (from the past 240 hour(s))  MRSA PCR Screening     Status: Abnormal   Collection Time: 07/17/14  6:50 AM  Result Value Ref Range Status   MRSA by PCR POSITIVE (A) NEGATIVE Final    Comment:        The GeneXpert MRSA Assay (FDA approved for NASAL specimens only), is one component of a comprehensive MRSA colonization surveillance program. It is not intended to diagnose MRSA infection nor to guide or monitor treatment for MRSA infections. RESULT CALLED TO, READ BACK BY AND VERIFIED WITH: Amedeo Plenty AT 7672 07/17/14 BY K BARR   Culture, Urine     Status: None   Collection Time: 07/18/14  8:25 AM  Result Value Ref Range Status   Specimen Description URINE, CATHETERIZED  Final   Special Requests NONE  Final   Culture  Setup Time   Final    07/18/2014 15:53 Performed at [place]   Final    >=100,000 COLONIES/ML Performed at [facility]    Culture   Final    Multiple bacterial morphotypes present, none predominant. Suggest appropriate recollection if clinically indicated. Performed at [facility]    Report Status 07/20/2014 FINAL  Final     Labs: Basic Metabolic Panel:  Recent Labs Lab 07/16/14 2130 07/16/14 2143 07/17/14 0430 07/18/14 0345 07/21/14 0611  NA 137 139 142 139 142  K 4.0 3.9 4.6 3.9 4.4  CL 101 103 110 107 107  CO2 24  --  22 22 27   GLUCOSE 140* 147* 94 94 96  BUN 24* 25* 17 8 8   CREATININE 0.59 0.60 0.64 0.54 0.70  CALCIUM 8.4  --  8.2* 8.3* 8.6   Liver Function Tests:  Recent Labs Lab 07/16/14 2130 07/17/14 0430  AST 12 13  ALT 7 7  ALKPHOS 104 94  BILITOT 0.2* <0.2*  PROT 6.9 6.2  ALBUMIN 3.0* 2.8*   CBC:  Recent Labs Lab 07/16/14 2130 07/16/14 2143 07/17/14 0430 07/18/14 0345 07/21/14 0611  WBC 9.0  --  7.4 6.0 4.4  NEUTROABS 7.3  --   --   --   --   HGB 11.4* 12.9 10.9*  10.6* 11.4*  HCT 32.9* 38.0 31.6* 30.9* 32.1*  MCV 81.6  --  81.9 80.9 82.1  PLT 287  --  278 286 277   CBG:  Recent Labs Lab 07/21/14 1148 07/21/14 1619 07/21/14 2141 07/22/14 0705 07/22/14 1155  GLUCAP 100* 98 101* 114* 36     IMAGING STUDIES Ct Head Wo Contrast  07/16/2014   CLINICAL DATA:  Right-sided weakness, code stroke  EXAM: CT HEAD WITHOUT CONTRAST  TECHNIQUE: Contiguous axial images were obtained from the base of the skull through the vertex without contrast.  COMPARISON:  05/13/2014  FINDINGS: Posterior left parietal encephalomalacia again evident from a remote left MCA territory infarct. Diffuse brain atrophy also evident with periventricular white matter microvascular ischemic changes. No acute intracranial hemorrhage, definite mass lesion, acute infarction, midline shift, herniation, hydrocephalus, or extra-axial fluid collection. No focal mass effect or edema. Cisterns are patent. No cerebellar abnormality. Orbits are symmetric. Mastoids and sinuses are clear. Exam is  limited with motion artifact.  IMPRESSION: Remote posterior left MCA territory infarct with encephalomalacia.  Mild brain atrophy and chronic white matter microvascular change.  No acute intracranial finding by noncontrast CT  These results were called by telephone at the time of interpretation on 07/16/2014 at 9:54 pm to Dr. Doy Mince, who verbally acknowledged these results.   Electronically Signed   By: Daryll Brod M.D.   On: 07/16/2014 21:54   Mr Brain Wo Contrast  07/17/2014   CLINICAL DATA:  Seizure  EXAM: MRI HEAD WITHOUT CONTRAST  TECHNIQUE: Multiplanar, multiecho pulse sequences of the brain and surrounding structures were obtained without intravenous contrast.  COMPARISON:  CT head 07/16/2014  FINDINGS: Chronic hemorrhagic infarct in the left temporoparietal lobe with volume loss and encephalomalacia. Small area of chronic ischemia in the left thalamus. Mild chronic microvascular ischemic changes in the  white matter.  Negative for acute infarct.  Mild atrophy. Negative for mass or edema. Negative for hydrocephalus. Small area of chronic hemorrhage in the right parietal white matter.  Paranasal sinuses are clear.  IMPRESSION: Chronic hemorrhagic infarct left temporoparietal lobe. Mild chronic microvascular ischemic changes in the white matter. No acute abnormality.   Electronically Signed   By: Franchot Gallo M.D.   On: 07/17/2014 14:30   Dg Chest Port 1 View  07/17/2014   CLINICAL DATA:  Initial evaluation for pneumonia cough confusion  EXAM: PORTABLE CHEST - 1 VIEW  COMPARISON:  05/18/1949  FINDINGS: Mild to moderate cardiac enlargement stable. Uncoiling of the aorta. Vascular pattern normal. No consolidation or effusion. 5 mm nodular opacity left apex new from prior studies.  IMPRESSION: 1. No evidence of pneumonia 2. Consider CT thorax for 5 mm new nodular opacity left lung apex   Electronically Signed   By: Skipper Cliche M.D.   On: 07/17/2014 08:29    DISCHARGE EXAMINATION: Filed Vitals:   07/21/14 2141 07/22/14 0254 07/22/14 0519 07/22/14 1026  BP: 134/69 132/67 156/73 141/52  Pulse: 81 77 89 73  Temp: 98.2 F (36.8 C) 97.8 F (36.6 C) 98.2 F (36.8 C)   TempSrc: Oral Oral Axillary   Resp: 18 16 16    Height:      Weight:      SpO2: 100% 94% 98%    General appearance: appears stated age, distracted and no distress Resp: clear to auscultation bilaterally Cardio: regular rate and rhythm, S1, S2 normal, no murmur, click, rub or gallop GI: soft, non-tender; bowel sounds normal; no masses,  no organomegaly and PEG noted. No drainage noted.  DISPOSITION: SNF  Discharge Instructions    Call MD for:  difficulty breathing, headache or visual disturbances    Complete by:  As directed      Call MD for:  extreme fatigue    Complete by:  As directed      Call MD for:  persistant dizziness or light-headedness    Complete by:  As directed      Call MD for:  persistant nausea and vomiting     Complete by:  As directed      Call MD for:  severe uncontrolled pain    Complete by:  As directed      Call MD for:  temperature >100.4    Complete by:  As directed      Diet - low sodium heart healthy    Complete by:  As directed      Discharge instructions    Complete by:  As directed   A  calorie count was performed this weekend. I discussed it with the nutritionist. Her intake was not optimal. I spoke to the patient's power of attorney who is her nephew. It is quite possible that with assistance and encouragement her oral intake might improve. Use of Ensure in-between meals may also provide additional nutritional supplementation. But at this time and I am recommend leaving the PEG tube in, in case her ability to eat orally does not improve and she starts losing weight. The infection around the PEG tube is much improved. I would recommend another calorie count to be done at the skilled nursing facility and if she demonstrates consistent oral intake with appropriate caloric intake the PEG can be removed at that point.     Discharge wound care:    Complete by:  As directed   Antibiotic ointment to PEG site.     Increase activity slowly    Complete by:  As directed            ALLERGIES:  Allergies  Allergen Reactions  . Aspirin Other (See Comments) and Diarrhea    Only ASA 325mg  ; pt can tolerate low dose asa 81mg    Reaction : GI upset REACTION: unknown  . Codeine Other (See Comments)    Other reaction(s): SHORTNESS OF BREATH REACTION: unknown  . Hydralazine Other (See Comments)    REACTION: unknown  . Lipitor [Atorvastatin] Other (See Comments) and Diarrhea    GI upset, rhabdomyalosis REACTION: unknown  . Reserpine Other (See Comments)    REACTION: unknown  . Tramadol Other (See Comments)    REACTION: unknown  . Hctz [Hydrochlorothiazide] Other (See Comments) and Rash    REACTION: unknown    Current Discharge Medication List    START taking these medications   Details   neomycin-bacitracin-polymyxin (NEOSPORIN) OINT Apply 1 application topically 2 (two) times daily. Apply to skin around PEG tube insertion site.    potassium chloride SA (KLOR-CON M20) 20 MEQ tablet Take 1 tablet (20 mEq total) by mouth daily as needed (whenever she is given Lasix).      CONTINUE these medications which have CHANGED   Details  acetaminophen (TYLENOL) 325 MG tablet Take 2 tablets (650 mg total) by mouth every 6 (six) hours as needed for mild pain or fever.    apixaban (ELIQUIS) 5 MG TABS tablet Take 1 tablet (5 mg total) by mouth 2 (two) times daily. Qty: 60 tablet    feeding supplement, ENSURE COMPLETE, (ENSURE COMPLETE) LIQD Take 237 mLs by mouth 3 (three) times daily between meals.    ferrous sulfate 325 (65 FE) MG tablet Take 1 tablet (325 mg total) by mouth daily. Refills: 3    furosemide (LASIX) 40 MG tablet Take 1 tablet (40 mg total) by mouth daily as needed (for leg swelling.  Give PRN potassium when Lasix is given). Qty: 30 tablet    lacosamide (VIMPAT) 200 MG TABS tablet Take 1 tablet (200 mg total) by mouth 2 (two) times daily. Qty: 60 tablet    levETIRAcetam (KEPPRA) 750 MG tablet Take 1 tablet (750 mg total) by mouth 2 (two) times daily.    LORazepam (ATIVAN) 0.5 MG tablet Take 1 tablet (0.5 mg total) by mouth every 6 (six) hours as needed (for agitation). Qty: 15 tablet, Refills: 0    magnesium oxide (MAG-OX) 400 MG tablet Take 1 tablet (400 mg total) by mouth 3 (three) times daily.    metoprolol tartrate (LOPRESSOR) 12.5 mg TABS tablet Take 1.5 tablets (37.5 mg  total) by mouth 2 (two) times daily.    Multiple Vitamin (MULTIVITAMIN) LIQD Take 5 mLs by mouth daily.    ondansetron (ZOFRAN) 4 MG tablet Take 1 tablet (4 mg total) by mouth every 4 (four) hours as needed for nausea. Qty: 20 tablet, Refills: 0      CONTINUE these medications which have NOT CHANGED   Details  albuterol (PROVENTIL) (2.5 MG/3ML) 0.083% nebulizer solution Take 2.5 mg by  nebulization every 2 (two) hours as needed for wheezing or shortness of breath.    famotidine (PEPCID) 40 MG/5ML suspension Take 20 mg by mouth 2 (two) times daily.    PRESCRIPTION MEDICATION Apply 1 mg topically every 6 (six) hours as needed (ATIVAN GEL for agitation).    Water For Irrigation, Sterile (FREE WATER) SOLN Place 150 mLs into feeding tube every 6 (six) hours.      STOP taking these medications     Amino Acids-Protein Hydrolys (FEEDING SUPPLEMENT, PRO-STAT SUGAR FREE 64,) LIQD      doxycycline (VIBRA-TABS) 100 MG tablet      levETIRAcetam (KEPPRA) 100 MG/ML solution      metroNIDAZOLE (FLAGYL) 500 MG tablet      rifampin (RIFADIN) 300 MG capsule      potassium chloride 20 MEQ/15ML (10%) solution      Amino Acids-Protein Hydrolys (FEEDING SUPPLEMENT, PRO-STAT SUGAR FREE 64,) LIQD        Follow-up Information    Follow up with GIBSON,DAVID, MD. Schedule an appointment as soon as possible for a visit in 1 week.   Specialty:  Family Medicine   Why:  post hospitalization follow up   Contact information:   [address] [place] [address] [phone]       TOTAL DISCHARGE TIME: 35 mins.  [place]  [facility] Pager [phone]  07/22/2014, 12:21 PM

## 2014-07-22 NOTE — Clinical Social Work Note (Signed)
Clinical Social Worker facilitated patient discharge including contacting patient family and facility to confirm patient discharge plans.  Clinical information faxed to facility and family agreeable with plan.  CSW arranged ambulance transport via [facility] to [facility].  RN to call report prior to discharge.  Clinical Social Worker will sign off for now as social work intervention is no longer needed. Please consult [place] again if new need arises.  Glendon Axe, MSW, LCSWA [phone] 07/22/2014 2:14 PM

## 2014-07-22 NOTE — Progress Notes (Signed)
Physical Therapy Treatment Patient Details Name: Tanya White MRN: [id number] DOB: Oct 23, 1935 Today's Date: 07/22/2014    History of Present Illness 78 yo female admitted from [facility] with LOC, slurred speech, R gaze. Pt (+) seizure on arrival. MRI(+) remote chronic hemorrhagic infarct left temporoparietal lobePMH: seizures, HTN, PNA, CVA, GIB, PE, memory loss, gastrostomy tube in place    PT Comments    Patient able to progress with ambulation this session. Still unsafe and requiring A due to decreased cognition and awareness. Continue to recommend return to SNF for ongoing therapy. Will continue with current POC  Follow Up Recommendations  SNF     Equipment Recommendations  None recommended by PT    Recommendations for Other Services       Precautions / Restrictions Precautions Precautions: Fall    Mobility  Bed Mobility Overal bed mobility: Needs Assistance Bed Mobility: Supine to Sit     Supine to sit: Min assist     General bed mobility comments: Min A for facilitation out of bed. Once started patient able to complete on her own  Transfers Overall transfer level: Needs assistance Equipment used: 1 person hand held assist   Sit to Stand: Min assist         General transfer comment: Min A to facilitate stand and to ensure balance.   Ambulation/Gait Ambulation/Gait assistance: Min assist Ambulation Distance (Feet): 210 Feet Assistive device: 1 person hand held assist Gait Pattern/deviations: Step-through pattern;Drifts right/left;Narrow base of support;Shuffle Gait velocity: reduced   General Gait Details: Continues with shuffle gait but able to walk outside in hallway this session. Patient appeared somewhat cautious about being out in hall. No advert unawareness of R side   Stairs            Wheelchair Mobility    Modified Rankin (Stroke Patients Only)       Balance     Sitting balance-Leahy Scale: Fair       Standing balance-Leahy  Scale: Fair Standing balance comment: Able to stand with support of sink for ADL                    Cognition Arousal/Alertness: Awake/alert Behavior During Therapy: Restless Overall Cognitive Status: History of cognitive impairments - at baseline       Memory: Decreased recall of precautions;Decreased short-term memory              Exercises      General Comments        Pertinent Vitals/Pain Pain Assessment: No/denies pain    Home Living                      Prior Function            PT Goals (current goals can now be found in the care plan section) Progress towards PT goals: Progressing toward goals    Frequency  Min 2X/week    PT Plan Current plan remains appropriate    Co-evaluation             End of Session Equipment Utilized During Treatment: Gait belt Activity Tolerance: Patient tolerated treatment well Patient left: in chair;with call bell/phone within reach;with chair alarm set     Time: 4782-9562 PT Time Calculation (min) (ACUTE ONLY): 25 min  Charges:  $Gait Training: 8-22 mins $Therapeutic Activity: 8-22 mins                    G Codes:  Jacqualyn Posey 07/22/2014, 8:26 AM 07/22/2014 Jacqualyn Posey PTA [phone] pager [phone] office

## 2014-07-22 NOTE — Discharge Instructions (Signed)

## 2014-08-12 ENCOUNTER — Other Ambulatory Visit (HOSPITAL_COMMUNITY): Payer: Self-pay | Admitting: Internal Medicine

## 2014-08-12 DIAGNOSIS — W19XXXA Unspecified fall, initial encounter: Secondary | ICD-10-CM

## 2014-08-12 DIAGNOSIS — Y92129 Unspecified place in nursing home as the place of occurrence of the external cause: Principal | ICD-10-CM

## 2014-08-13 ENCOUNTER — Ambulatory Visit (HOSPITAL_COMMUNITY): Admission: RE | Admit: 2014-08-13 | Payer: PRIVATE HEALTH INSURANCE | Source: Ambulatory Visit

## 2014-08-14 ENCOUNTER — Ambulatory Visit (HOSPITAL_COMMUNITY): Payer: PRIVATE HEALTH INSURANCE

## 2014-09-11 ENCOUNTER — Encounter: Payer: Self-pay | Admitting: Neurology

## 2014-09-11 ENCOUNTER — Ambulatory Visit (INDEPENDENT_AMBULATORY_CARE_PROVIDER_SITE_OTHER): Payer: 59 | Admitting: Neurology

## 2014-09-11 VITALS — BP 158/82 | HR 78 | Ht 64.0 in | Wt 97.0 lb

## 2014-09-11 DIAGNOSIS — R569 Unspecified convulsions: Secondary | ICD-10-CM

## 2014-09-11 NOTE — Progress Notes (Signed)
PATIENT: Tanya White DOB: April 03, 1936  HISTORICAL  Tanya White is a 79 years old right-handed African-American female, is accompanied by her Nephew Tanya White for evaluation of worsening functional status, she is currently nursing home resident  She had a past medical history of stroke in 2012, with residual right-sided weakness, MRI of the brain at that time showed smaller foci of restricted diffusion are seen in the superior right frontal lobe at the level of the centrum semiovale,  Multiple infarcts are also seen in the cerebellum, and bilateral frontal lobes. Suggestive of embolic event,  Even after that stroke, she lives alone, driving, independent of her living untill June 2015, she suffered acute pancreatitis rupture, with pelvic abscess, she was treated with multiple hospital admission at [facility] prolonged antibiotics,  During that period of time, she also developed complex partial seizure, sounds like partial status, was treated with titrating dose of antiseptic medications, initially with Dilantin, Depakote, later also developed Dilantin toxicity, dizziness, confusion, sleepiness,  She was eventually discharged to rehabilitation center at [facility], [place] center, in September 2015 [place] evaluations, Dilantin level was 35, Depakote was less than 10, she was switched over to [place], 500 mg twice a day, Vimpat 150 mg twice a day, she has no recurrent seizure, gradually recovered, much less drowsy  She was noted continue have worsening memory trouble, much worse than her baseline, gait difficulty,  She is now taking Eliquis, long term anticoagulation, had left thoracentesis for pleural effusion  Most recent CAT scan in 2015 has demonstrated no acute lesions, left parietal area chronic stroke,  [facility] Video EEG monitoring: This study is consist with moderate cerebral dysfunction and encephalopathy and ongoing left posterior quadrant cortical  irritability. Lateralizing epileptiform discharges may be indicative of an underlying structural abnormality.  UPDATE Jan 27th 2016: She was admitted to [facility] in December first 2015 for recurrent seizure, concurrent with her PEG tube infection, receiving antibiotics, repeat MRI of the brain chronic hemorrhagic infarct left temporoparietal lobe. Mild chronic microvascular ischemic changes in the white matter. No acute abnormality EEG showed mild  generalized encephalopathy. There was no seizure or seizure predisposition recorded on this study.  Peg tube was taken out in December 2015, she is now higher dose of  Vimpat 200 mg twice a day, Keppra 750mg  bid, doing well, no recurrent seizure, continue has profound aphasia, memory trouble, mild gait difficulty, bowel and bladder incontinence, falling sometimes,  REVIEW OF SYSTEMS: Full 14 system review of systems performed and notable only for confusion, gait difficulties, seizure, blurry vision, hearing loss ALLERGIES: Allergies  Allergen Reactions  . Aspirin Other (See Comments) and Diarrhea    Only ASA 325mg  ; pt can tolerate low dose asa 81mg    Reaction : GI upset REACTION: unknown  . Codeine Other (See Comments)    Other reaction(s): SHORTNESS OF BREATH REACTION: unknown  . Hydralazine Other (See Comments)    REACTION: unknown  . Lipitor [Atorvastatin] Other (See Comments) and Diarrhea    GI upset, rhabdomyalosis REACTION: unknown  . Reserpine Other (See Comments)    REACTION: unknown  . Tramadol Other (See Comments)    REACTION: unknown  . Hctz [Hydrochlorothiazide] Other (See Comments) and Rash    REACTION: unknown    HOME MEDICATIONS: Current Outpatient Prescriptions on File Prior to Visit  Medication Sig Dispense Refill  . acetaminophen (TYLENOL) 325 MG tablet Take 2 tablets (650 mg total) by mouth every 6 (six) hours as needed for mild  pain or fever.    Marland Kitchen albuterol (PROVENTIL) (2.5 MG/3ML) 0.083% nebulizer  solution Take 2.5 mg by nebulization every 2 (two) hours as needed for wheezing or shortness of breath.    Marland Kitchen apixaban (ELIQUIS) 5 MG TABS tablet Take 1 tablet (5 mg total) by mouth 2 (two) times daily. 60 tablet   . famotidine (PEPCID) 40 MG/5ML suspension Take 20 mg by mouth 2 (two) times daily.    . feeding supplement, ENSURE COMPLETE, (ENSURE COMPLETE) LIQD Take 237 mLs by mouth 3 (three) times daily between meals.    . ferrous sulfate 325 (65 FE) MG tablet Take 1 tablet (325 mg total) by mouth daily.  3  . furosemide (LASIX) 40 MG tablet Take 1 tablet (40 mg total) by mouth daily as needed (for leg swelling.  Give PRN potassium when Lasix is given). 30 tablet   . lacosamide (VIMPAT) 200 MG TABS tablet Take 1 tablet (200 mg total) by mouth 2 (two) times daily. 60 tablet   . levETIRAcetam (KEPPRA) 750 MG tablet Take 1 tablet (750 mg total) by mouth 2 (two) times daily.    Marland Kitchen LORazepam (ATIVAN) 0.5 MG tablet Take 1 tablet (0.5 mg total) by mouth every 6 (six) hours as needed (for agitation). 15 tablet 0  . magnesium oxide (MAG-OX) 400 MG tablet Take 1 tablet (400 mg total) by mouth 3 (three) times daily.    . metoprolol tartrate (LOPRESSOR) 12.5 mg TABS tablet Take 1.5 tablets (37.5 mg total) by mouth 2 (two) times daily.    . Multiple Vitamin (MULTIVITAMIN) LIQD Take 5 mLs by mouth daily.    Marland Kitchen neomycin-bacitracin-polymyxin (NEOSPORIN) OINT Apply 1 application topically 2 (two) times daily. Apply to skin around PEG tube insertion site.    . ondansetron (ZOFRAN) 4 MG tablet Take 1 tablet (4 mg total) by mouth every 4 (four) hours as needed for nausea. 20 tablet 0  . potassium chloride SA (KLOR-CON M20) 20 MEQ tablet Take 1 tablet (20 mEq total) by mouth daily as needed (whenever she is given Lasix).    Marland Kitchen PRESCRIPTION MEDICATION Apply 1 mg topically every 6 (six) hours as needed (ATIVAN GEL for agitation).    . Water For Irrigation, Sterile (FREE WATER) SOLN Place 150 mLs into feeding tube every 6  (six) hours.     No current facility-administered medications on file prior to visit.    PAST MEDICAL HISTORY: Past Medical History  Diagnosis Date  . Seizures   . Hypotension   . Pneumonia   . Stroke   . GI bleed   . Pulmonary embolism   . Gastrostomy tube in place   . Memory loss     PAST SURGICAL HISTORY: Past Surgical History  Procedure Laterality Date  . Appendectomy    . Jejunostomy feeding tube      FAMILY HISTORY: Family History  Problem Relation Age of Onset  . High blood pressure Mother   . Cancer    . Stroke      SOCIAL HISTORY:  History   Social History  . Marital Status: Single    Spouse Name: N/A    Number of Children: 2  . Years of Education: N/A   Occupational History    Retired   Social History Main Topics  . Smoking status: Former Smoker -- 0.50 packs/day for 15 years  . Smokeless tobacco: Never Used  . Alcohol Use: No  . Drug Use: No  . Sexual Activity: Not on file   Other  Topics Concern  . Not on file   Social History Narrative   Patient lives in [place] .   Patient is retired.   Caffeine one cup daily.   PHYSICAL EXAM   Filed Vitals:   09/11/14 1103  BP: 158/82  Pulse: 78  Height: 5\' 4"  (1.626 m)  Weight: 97 lb (43.999 kg)    Not recorded      Body mass index is 16.64 kg/(m^2).   Generalized: In no acute distress  Neck: Supple, no carotid bruits   Cardiac: Regular rate rhythm  Pulmonary: Clear to auscultation bilaterally  Musculoskeletal: No deformity  Neurological examination  Mentation: She sees in wheelchair, depend on her nephew to provide history, Mini-Mental Status examination is 6 out of 30 today, she also has comprehension, and expressive aphasia Cranial nerve II-XII: Pupils were equal round reactive to light. Extraocular movements were full.  Visual field were full on confrontational test. Bilateral fundi were sharp.  Facial sensation and strength were normal. Hearing was intact to  finger rubbing bilaterally. Uvula tongue midline.  Head turning and shoulder shrug and were normal and symmetric.Tongue protrusion into cheek strength was normal.  Motor: She has mild right arm weakness, fixation on rapid rotating movement, mild right proximal, and distal leg weakness.  Sensory: Not reliable.  Coordination: Normal finger to nose, heel-to-shin bilaterally there was no truncal ataxia  Gait: She needs assistance to get up from a seated position,, dragging her right leg, unsteady,  Deep tendon reflexes: Brachioradialis 2/2, biceps 2/2, triceps 2/2, patellar 2/2, Achilles 2/2, plantar responses were flexor bilaterally.   DIAGNOSTIC DATA (LABS, IMAGING, TESTING) - I reviewed patient records, labs, notes, testing and imaging myself where available.  Lab Results  Component Value Date   WBC 4.4 07/21/2014   HGB 11.4* 07/21/2014   HCT 32.1* 07/21/2014   MCV 82.1 07/21/2014   PLT 277 07/21/2014      Component Value Date/Time   NA 142 07/21/2014 0611   K 4.4 07/21/2014 0611   CL 107 07/21/2014 0611   CO2 27 07/21/2014 0611   GLUCOSE 96 07/21/2014 0611   BUN 8 07/21/2014 0611   CREATININE 0.70 07/21/2014 0611   CALCIUM 8.6 07/21/2014 0611   PROT 6.2 07/17/2014 0430   ALBUMIN 2.8* 07/17/2014 0430   AST 13 07/17/2014 0430   ALT 7 07/17/2014 0430   ALKPHOS 94 07/17/2014 0430   BILITOT <0.2* 07/17/2014 0430   GFRNONAA 82* 07/21/2014 0611   GFRAA >90 07/21/2014 0611    ASSESSMENT AND PLAN  Fletcher Rathbun is a 79 y.o. female with history of embolic stroke, now on long-term anticoagulation Eliquis, since June 2015, she had multiple hospital admission for ruptured pancreatitis, abdominal infection, pelvic abscess, prolonged antibiotic treatment, since July 2015, she also developed frequent complex partial seizure, now taking Keppra 500 mg twice a day, Vimpat 150 mg twice a day since September 2015, tolerating it well, overall improvement, but remained confused, not back to  her baseline, Mini-Mental Status examinations are 6 out of 30, currently nursing home resident,  1, most likely she suffered some central nervous system degenerative disorder, with vascular component from her previous multiple strokes, 2. Complex partial seizure continue current Keppra 750 mg twice a day, Vimpat 200 mg twice a day.  3. Return to clinic with Hoyle White in 6 months  No orders of the defined types were placed in this encounter.    Return in about 6 months (around 03/12/2015).   Marcial Pacas, M.D. Ph.D.  [facility] [address], [address] [place], [place] [address] [phone] 

## 2015-01-06 ENCOUNTER — Encounter: Payer: Self-pay | Admitting: Nurse Practitioner

## 2015-01-06 ENCOUNTER — Ambulatory Visit (INDEPENDENT_AMBULATORY_CARE_PROVIDER_SITE_OTHER): Payer: Medicare Other | Admitting: Nurse Practitioner

## 2015-01-06 VITALS — BP 142/87 | HR 60 | Ht 59.75 in | Wt 95.5 lb

## 2015-01-06 DIAGNOSIS — R569 Unspecified convulsions: Secondary | ICD-10-CM | POA: Diagnosis not present

## 2015-01-06 DIAGNOSIS — R413 Other amnesia: Secondary | ICD-10-CM | POA: Diagnosis not present

## 2015-01-06 DIAGNOSIS — G9340 Encephalopathy, unspecified: Secondary | ICD-10-CM | POA: Diagnosis not present

## 2015-01-06 DIAGNOSIS — I639 Cerebral infarction, unspecified: Secondary | ICD-10-CM

## 2015-01-06 NOTE — Progress Notes (Signed)
[facility]  PATIENT: Jacqualine Mau DOB: 1936-02-28   REASON FOR VISIT: Follow-up for history of multiple strokes, complex partial seizure disorder, memory loss  HISTORY FROM: [place] ILLNESS:Cherilyn Lasky is a 79 years old right-handed African-American female, is accompanied by her Nephew Cathlean Sauer for evaluation of worsening functional status, she is currently nursing home resident She had a past medical history of stroke in 2012, with residual right-sided weakness, MRI of the brain at that time showed smaller foci of restricted diffusion are seen in the superior right frontal lobe at the level of the centrum semiovale, Multiple infarcts are also seen in the cerebellum, and bilateral frontal lobes. Suggestive of embolic event, Even after that stroke, she lives alone, driving, independent of her living untill June 2015, she suffered acute pancreatitis rupture, with pelvic abscess, she was treated with multiple hospital admission at [facility] prolonged antibiotics, During that period of time, she also developed complex partial seizure, sounds like partial status, was treated with titrating dose of antiseptic medications, initially with Dilantin, Depakote, later also developed Dilantin toxicity, dizziness, confusion, sleepiness, She was eventually discharged to rehabilitation center at [facility], [place] [facility], in September 2015  evaluations, Dilantin level was 35, Depakote was less than 10, she was switched over to [place], 500 mg twice a day, Vimpat 150 mg twice a day, she has no recurrent seizure, gradually recovered, much less drowsy She was noted continue have worsening memory trouble, much worse than her baseline, gait difficulty, She is now taking Eliquis, long term anticoagulation, had left thoracentesis for pleural effusion Most recent CAT scan in 2015 has demonstrated no acute lesions, left parietal area chronic  stroke, [facility] Video EEG monitoring: This study is consist with moderate cerebral dysfunction and encephalopathy and ongoing left posterior quadrant cortical irritability. Lateralizing epileptiform discharges may be indicative of an underlying structural abnormality.  UPDATE Jan 27th 2016: She was admitted to [facility] in December first 2015 for recurrent seizure, concurrent with her PEG tube infection, receiving antibiotics, repeat MRI of the brain chronic hemorrhagic infarct left temporoparietal lobe. Mild chronic microvascular ischemic changes in the white matter. No acute abnormality EEG showed mild generalized encephalopathy. There was no seizure or seizure predisposition recorded on this study.Peg tube was taken out in December 2015, she is now higher dose of Vimpat 200 mg twice a day, Keppra 750mg  bid, doing well, no recurrent seizure, continue has profound aphasia, memory trouble, mild gait difficulty, bowel and bladder incontinence, falling sometimes. Update 01/06/2015, Ms. 77, 79 year old female returns for follow-up with her nephew. He says she had a seizure about 2 weeks ago and was given IV Dilantin in the nursing home ordered by the medical director. I see no indication OF that on her MAR. she is currently on Vimpat 200 twice daily and Keppra 750 twice daily. She has history of strokes and profound aphasia, she also has weakness on the right side particularly the right upper extremity. She has significant memory loss. She has bowel and bladder incontinence, he denies that she is had any falls since last seen. She returns for reevaluation   REVIEW OF SYSTEMS: Full 14 system review of systems performed and notable only for those listed, all others are neg:  Constitutional: neg  Cardiovascular: neg Ear/Nose/Throat: neg  Skin: neg Eyes: neg Respiratory: neg Gastroitestinal: neg  Hematology/Lymphatic: neg  Endocrine: neg Musculoskeletal:neg Allergy/Immunology:  neg Neurological: Seizure Psychiatric: neg Sleep : neg   ALLERGIES:  Allergies  Allergen Reactions  . Aspirin Other (See Comments) and Diarrhea    Only ASA 325mg  ; pt can tolerate low dose asa 81mg    Reaction : GI upset REACTION: unknown  . Codeine Other (See Comments)    Other reaction(s): SHORTNESS OF BREATH REACTION: unknown  . Hydralazine Other (See Comments)    REACTION: unknown  . Lipitor [Atorvastatin] Other (See Comments) and Diarrhea    GI upset, rhabdomyalosis REACTION: unknown  . Reserpine Other (See Comments)    REACTION: unknown  . Tramadol Other (See Comments)    REACTION: unknown  . Hctz [Hydrochlorothiazide] Other (See Comments) and Rash    REACTION: unknown    HOME MEDICATIONS: Outpatient Prescriptions Prior to Visit  Medication Sig Dispense Refill  . acetaminophen (TYLENOL) 325 MG tablet Take 2 tablets (650 mg total) by mouth every 6 (six) hours as needed for mild pain or fever.    Marland Kitchen albuterol (PROVENTIL) (2.5 MG/3ML) 0.083% nebulizer solution Take 2.5 mg by nebulization every 2 (two) hours as needed for wheezing or shortness of breath.    Marland Kitchen apixaban (ELIQUIS) 5 MG TABS tablet Take 1 tablet (5 mg total) by mouth 2 (two) times daily. 60 tablet   . famotidine (PEPCID) 40 MG/5ML suspension Take 20 mg by mouth 2 (two) times daily.    . feeding supplement, ENSURE COMPLETE, (ENSURE COMPLETE) LIQD Take 237 mLs by mouth 3 (three) times daily between meals.    . ferrous sulfate 325 (65 FE) MG tablet Take 1 tablet (325 mg total) by mouth daily.  3  . furosemide (LASIX) 40 MG tablet Take 1 tablet (40 mg total) by mouth daily as needed (for leg swelling.  Give PRN potassium when Lasix is given). 30 tablet   . lacosamide (VIMPAT) 200 MG TABS tablet Take 1 tablet (200 mg total) by mouth 2 (two) times daily. 60 tablet   . levETIRAcetam (KEPPRA) 750 MG tablet Take 1 tablet (750 mg total) by mouth 2 (two) times daily.    Marland Kitchen LORazepam (ATIVAN) 0.5 MG tablet Take 1 tablet (0.5  mg total) by mouth every 6 (six) hours as needed (for agitation). 15 tablet 0  . magnesium oxide (MAG-OX) 400 MG tablet Take 1 tablet (400 mg total) by mouth 3 (three) times daily.    . metoprolol tartrate (LOPRESSOR) 12.5 mg TABS tablet Take 1.5 tablets (37.5 mg total) by mouth 2 (two) times daily.    . Multiple Vitamin (MULTIVITAMIN) LIQD Take 5 mLs by mouth daily.    Marland Kitchen neomycin-bacitracin-polymyxin (NEOSPORIN) OINT Apply 1 application topically 2 (two) times daily. Apply to skin around PEG tube insertion site.    . ondansetron (ZOFRAN) 4 MG tablet Take 1 tablet (4 mg total) by mouth every 4 (four) hours as needed for nausea. 20 tablet 0  . potassium chloride SA (KLOR-CON M20) 20 MEQ tablet Take 1 tablet (20 mEq total) by mouth daily as needed (whenever she is given Lasix).    Marland Kitchen PRESCRIPTION MEDICATION Apply 1 mg topically every 6 (six) hours as needed (ATIVAN GEL for agitation).    . Water For Irrigation, Sterile (FREE WATER) SOLN Place 150 mLs into feeding tube every 6 (six) hours.     No facility-administered medications prior to visit.    PAST MEDICAL HISTORY: Past Medical History  Diagnosis Date  . Seizures   . Hypotension   . Pneumonia   . Stroke   . GI bleed   . Pulmonary embolism   . Gastrostomy tube in place   .  Memory loss     PAST SURGICAL HISTORY: Past Surgical History  Procedure Laterality Date  . Appendectomy    . Jejunostomy feeding tube      FAMILY HISTORY: Family History  Problem Relation Age of Onset  . High blood pressure Mother   . Cancer    . Stroke      SOCIAL HISTORY: History   Social History  . Marital Status: Single    Spouse Name: N/A  . Number of Children: 2  . Years of Education: N/A   Occupational History  .      Retired   Social History Main Topics  . Smoking status: Former Smoker -- 0.50 packs/day for 15 years  . Smokeless tobacco: Never Used  . Alcohol Use: No  . Drug Use: No  . Sexual Activity: Not on file   Other Topics  Concern  . Not on file   Social History Narrative   Patient lives in [place] .   Patient is retired.   Caffeine one cup daily.     PHYSICAL EXAM  Filed Vitals:   01/06/15 1305  BP: 142/87  Pulse: 60  Height: 4' 11.75" (1.518 m)  Weight: 95 lb 8 oz (43.319 kg)   Body mass index is 18.8 kg/(m^2). Generalized: In no acute distress Neck: Supple, no carotid bruits  Cardiac: Regular rate rhythm Pulmonary: Clear to auscultation bilaterally Musculoskeletal: No deformity  Neurological examination  Mentation: She is seated provides no history.  Mini-Mental Status examination is 5 out of 30 today, she also has comprehension, and expressive aphasia. Cranial nerve II-XII: Pupils were equal round reactive to light. Extraocular movements were full. Visual field were full on confrontational test. Bilateral fundi were sharp. Facial sensation and strength were normal. Hearing was intact to finger rubbing bilaterally. Uvula tongue midline. Head turning and shoulder shrug and were normal and symmetric.Tongue protrusion into cheek strength was normal. Motor: She has mild right arm weakness, fixation on rapid rotating movement, mild right proximal, and distal leg weakness.  Sensory: Not reliable. Coordination: Unable to perform  Gait: She needs assistance to get up from a seated position,, dragging her right leg, unsteady, no assistive device Deep tendon reflexes: Brachioradialis 2/2, biceps 2/2, triceps 2/2, patellar 2/2, Achilles 2/2, plantar responses were flexor bilaterally.    DIAGNOSTIC DATA (LABS, IMAGING, TESTING) - I reviewed patient records, labs, notes, testing and imaging myself where available.  Lab Results  Component Value Date   WBC 4.4 07/21/2014   HGB 11.4* 07/21/2014   HCT 32.1* 07/21/2014   MCV 82.1 07/21/2014   PLT 277 07/21/2014      Component Value Date/Time   NA 142 07/21/2014 0611   K 4.4 07/21/2014 0611   CL 107 07/21/2014 0611   CO2 27  07/21/2014 0611   GLUCOSE 96 07/21/2014 0611   BUN 8 07/21/2014 0611   CREATININE 0.70 07/21/2014 0611   CALCIUM 8.6 07/21/2014 0611   PROT 6.2 07/17/2014 0430   ALBUMIN 2.8* 07/17/2014 0430   AST 13 07/17/2014 0430   ALT 7 07/17/2014 0430   ALKPHOS 94 07/17/2014 0430   BILITOT <0.2* 07/17/2014 0430   GFRNONAA 82* 07/21/2014 0611   GFRAA >90 07/21/2014 2505    ASSESSMENT AND PLAN  79 y.o. year old female  has a past medical history of Seizures;  Stroke;  and Memory loss. here to follow-up. Recent seizure activity 2 weeks ago per nephew.  Continue Vimpat 200 mg by mouth twice daily Increase Keppra  thousand milligrams twice daily Memory score 5 out of 30 Follow-up in 6 months Dennie Bible, [facility], [facility], [facility] [address] [place], [place] [address] [phone]

## 2015-01-06 NOTE — Patient Instructions (Signed)
Per skilled nsg sheet 

## 2015-01-07 NOTE — Progress Notes (Signed)
I have reviewed and agreed above plan. 

## 2015-03-09 ENCOUNTER — Encounter (HOSPITAL_COMMUNITY): Payer: Self-pay | Admitting: Radiology

## 2015-03-09 ENCOUNTER — Emergency Department (HOSPITAL_COMMUNITY): Payer: Medicare Other

## 2015-03-09 ENCOUNTER — Inpatient Hospital Stay (HOSPITAL_COMMUNITY): Payer: Medicare Other

## 2015-03-09 ENCOUNTER — Inpatient Hospital Stay (HOSPITAL_COMMUNITY)
Admission: EM | Admit: 2015-03-09 | Discharge: 2015-03-12 | DRG: 100 | Disposition: A | Discharge status: Skilled Nursing Facility | Payer: Medicare Other | Attending: Emergency Medicine | Admitting: Emergency Medicine

## 2015-03-09 DIAGNOSIS — R531 Weakness: Secondary | ICD-10-CM | POA: Diagnosis present

## 2015-03-09 DIAGNOSIS — R569 Unspecified convulsions: Secondary | ICD-10-CM | POA: Diagnosis not present

## 2015-03-09 DIAGNOSIS — M311 Thrombotic microangiopathy: Secondary | ICD-10-CM | POA: Insufficient documentation

## 2015-03-09 DIAGNOSIS — D509 Iron deficiency anemia, unspecified: Secondary | ICD-10-CM | POA: Diagnosis present

## 2015-03-09 DIAGNOSIS — N390 Urinary tract infection, site not specified: Secondary | ICD-10-CM | POA: Diagnosis present

## 2015-03-09 DIAGNOSIS — G40901 Epilepsy, unspecified, not intractable, with status epilepticus: Secondary | ICD-10-CM

## 2015-03-09 DIAGNOSIS — Z823 Family history of stroke: Secondary | ICD-10-CM | POA: Diagnosis not present

## 2015-03-09 DIAGNOSIS — J9600 Acute respiratory failure, unspecified whether with hypoxia or hypercapnia: Secondary | ICD-10-CM | POA: Diagnosis present

## 2015-03-09 DIAGNOSIS — Z87891 Personal history of nicotine dependence: Secondary | ICD-10-CM

## 2015-03-09 DIAGNOSIS — I69320 Aphasia following cerebral infarction: Secondary | ICD-10-CM | POA: Diagnosis not present

## 2015-03-09 DIAGNOSIS — E876 Hypokalemia: Secondary | ICD-10-CM | POA: Diagnosis present

## 2015-03-09 DIAGNOSIS — G9340 Encephalopathy, unspecified: Secondary | ICD-10-CM | POA: Diagnosis not present

## 2015-03-09 DIAGNOSIS — J9690 Respiratory failure, unspecified, unspecified whether with hypoxia or hypercapnia: Secondary | ICD-10-CM

## 2015-03-09 DIAGNOSIS — G40301 Generalized idiopathic epilepsy and epileptic syndromes, not intractable, with status epilepticus: Secondary | ICD-10-CM

## 2015-03-09 DIAGNOSIS — K219 Gastro-esophageal reflux disease without esophagitis: Secondary | ICD-10-CM | POA: Diagnosis present

## 2015-03-09 DIAGNOSIS — R1310 Dysphagia, unspecified: Secondary | ICD-10-CM | POA: Diagnosis present

## 2015-03-09 DIAGNOSIS — Z87440 Personal history of urinary (tract) infections: Secondary | ICD-10-CM

## 2015-03-09 DIAGNOSIS — Z22322 Carrier or suspected carrier of Methicillin resistant Staphylococcus aureus: Secondary | ICD-10-CM | POA: Diagnosis not present

## 2015-03-09 DIAGNOSIS — Z9114 Patient's other noncompliance with medication regimen: Secondary | ICD-10-CM | POA: Diagnosis present

## 2015-03-09 DIAGNOSIS — Z0189 Encounter for other specified special examinations: Secondary | ICD-10-CM | POA: Insufficient documentation

## 2015-03-09 DIAGNOSIS — G40201 Localization-related (focal) (partial) symptomatic epilepsy and epileptic syndromes with complex partial seizures, not intractable, with status epilepticus: Principal | ICD-10-CM | POA: Diagnosis present

## 2015-03-09 DIAGNOSIS — R413 Other amnesia: Secondary | ICD-10-CM | POA: Diagnosis present

## 2015-03-09 DIAGNOSIS — R4701 Aphasia: Secondary | ICD-10-CM | POA: Diagnosis present

## 2015-03-09 DIAGNOSIS — I69351 Hemiplegia and hemiparesis following cerebral infarction affecting right dominant side: Secondary | ICD-10-CM | POA: Diagnosis not present

## 2015-03-09 DIAGNOSIS — I10 Essential (primary) hypertension: Secondary | ICD-10-CM | POA: Diagnosis present

## 2015-03-09 DIAGNOSIS — Z86711 Personal history of pulmonary embolism: Secondary | ICD-10-CM | POA: Diagnosis not present

## 2015-03-09 DIAGNOSIS — B9620 Unspecified Escherichia coli [E. coli] as the cause of diseases classified elsewhere: Secondary | ICD-10-CM | POA: Diagnosis present

## 2015-03-09 DIAGNOSIS — Z931 Gastrostomy status: Secondary | ICD-10-CM

## 2015-03-09 DIAGNOSIS — I639 Cerebral infarction, unspecified: Secondary | ICD-10-CM

## 2015-03-09 DIAGNOSIS — M3119 Other thrombotic microangiopathy: Secondary | ICD-10-CM | POA: Insufficient documentation

## 2015-03-09 DIAGNOSIS — Z7901 Long term (current) use of anticoagulants: Secondary | ICD-10-CM

## 2015-03-09 HISTORY — DX: Acute respiratory failure, unspecified whether with hypoxia or hypercapnia: J96.00

## 2015-03-09 HISTORY — DX: Thrombotic microangiopathy: M31.1

## 2015-03-09 HISTORY — DX: Other thrombotic microangiopathy: M31.19

## 2015-03-09 LAB — CBC
HCT: 37.6 % (ref 36.0–46.0)
Hemoglobin: 13.3 g/dL (ref 12.0–15.0)
MCH: 29.3 pg (ref 26.0–34.0)
MCHC: 35.4 g/dL (ref 30.0–36.0)
MCV: 82.8 fL (ref 78.0–100.0)
Platelets: 162 10*3/uL (ref 150–400)
RBC: 4.54 MIL/uL (ref 3.87–5.11)
RDW: 14.7 % (ref 11.5–15.5)
WBC: 7.6 10*3/uL (ref 4.0–10.5)

## 2015-03-09 LAB — VALPROIC ACID LEVEL: Valproic Acid Lvl: 36 ug/mL — ABNORMAL LOW (ref 50.0–100.0)

## 2015-03-09 LAB — I-STAT CHEM 8, ED
BUN: 30 mg/dL — AB (ref 6–20)
CALCIUM ION: 1.21 mmol/L (ref 1.13–1.30)
Chloride: 104 mmol/L (ref 101–111)
Creatinine, Ser: 1 mg/dL (ref 0.44–1.00)
Glucose, Bld: 119 mg/dL — ABNORMAL HIGH (ref 65–99)
HCT: 41 % (ref 36.0–46.0)
HEMOGLOBIN: 13.9 g/dL (ref 12.0–15.0)
Potassium: 4.9 mmol/L (ref 3.5–5.1)
Sodium: 141 mmol/L (ref 135–145)
TCO2: 28 mmol/L (ref 0–100)

## 2015-03-09 LAB — URINALYSIS, ROUTINE W REFLEX MICROSCOPIC
Bilirubin Urine: NEGATIVE
Glucose, UA: 250 mg/dL — AB
KETONES UR: 15 mg/dL — AB
NITRITE: NEGATIVE
PH: 7.5 (ref 5.0–8.0)
Protein, ur: NEGATIVE mg/dL
Specific Gravity, Urine: 1.017 (ref 1.005–1.030)
Urobilinogen, UA: 0.2 mg/dL (ref 0.0–1.0)

## 2015-03-09 LAB — COMPREHENSIVE METABOLIC PANEL
ALT: 12 U/L — ABNORMAL LOW (ref 14–54)
ANION GAP: 9 (ref 5–15)
AST: 20 U/L (ref 15–41)
Albumin: 3.7 g/dL (ref 3.5–5.0)
Alkaline Phosphatase: 67 U/L (ref 38–126)
BUN: 21 mg/dL — AB (ref 6–20)
CO2: 26 mmol/L (ref 22–32)
CREATININE: 0.95 mg/dL (ref 0.44–1.00)
Calcium: 9.2 mg/dL (ref 8.9–10.3)
Chloride: 103 mmol/L (ref 101–111)
GFR calc Af Amer: >60 mL/min (ref >60)
GFR calc non Af Amer: 56 mL/min — ABNORMAL LOW (ref >60)
GLUCOSE: 115 mg/dL — AB (ref 65–99)
Potassium: 4.5 mmol/L (ref 3.5–5.1)
Sodium: 138 mmol/L (ref 135–145)
Total Bilirubin: 0.7 mg/dL (ref 0.3–1.2)
Total Protein: 7.1 g/dL (ref 6.5–8.1)

## 2015-03-09 LAB — POCT I-STAT 3, ART BLOOD GAS (G3+)
Acid-Base Excess: 3 mmol/L — ABNORMAL HIGH (ref 0.0–2.0)
Bicarbonate: 27.4 mEq/L — ABNORMAL HIGH (ref 20.0–24.0)
O2 Saturation: 100 %
PH ART: 7.443 (ref 7.350–7.450)
Patient temperature: 98.6
TCO2: 29 mmol/L (ref 0–100)
pCO2 arterial: 40.1 mmHg (ref 35.0–45.0)
pO2, Arterial: 185 mmHg — ABNORMAL HIGH (ref 80.0–100.0)

## 2015-03-09 LAB — RAPID URINE DRUG SCREEN, HOSP PERFORMED
Amphetamines: NOT DETECTED
BARBITURATES: NOT DETECTED
BENZODIAZEPINES: NOT DETECTED
Cocaine: NOT DETECTED
Opiates: NOT DETECTED
TETRAHYDROCANNABINOL: NOT DETECTED

## 2015-03-09 LAB — DIFFERENTIAL
Basophils Absolute: 0 10*3/uL (ref 0.0–0.1)
Basophils Relative: 0 % (ref 0–1)
Eosinophils Absolute: 0 10*3/uL (ref 0.0–0.7)
Eosinophils Relative: 1 % (ref 0–5)
LYMPHS PCT: 13 % (ref 12–46)
Lymphs Abs: 1 10*3/uL (ref 0.7–4.0)
MONO ABS: 0.4 10*3/uL (ref 0.1–1.0)
MONOS PCT: 5 % (ref 3–12)
NEUTROS ABS: 6.3 10*3/uL (ref 1.7–7.7)
Neutrophils Relative %: 81 % — ABNORMAL HIGH (ref 43–77)

## 2015-03-09 LAB — APTT: aPTT: 30 seconds (ref 24–37)

## 2015-03-09 LAB — PROTIME-INR
INR: 1.43 (ref 0.00–1.49)
Prothrombin Time: 17.6 seconds — ABNORMAL HIGH (ref 11.6–15.2)

## 2015-03-09 LAB — I-STAT TROPONIN, ED: Troponin i, poc: 0.01 ng/mL (ref 0.00–0.08)

## 2015-03-09 LAB — URINE MICROSCOPIC-ADD ON

## 2015-03-09 LAB — ETHANOL

## 2015-03-09 LAB — MRSA PCR SCREENING: MRSA by PCR: POSITIVE — AB

## 2015-03-09 LAB — GLUCOSE, CAPILLARY: Glucose-Capillary: 97 mg/dL (ref 65–99)

## 2015-03-09 LAB — CBG MONITORING, ED: GLUCOSE-CAPILLARY: 124 mg/dL — AB (ref 65–99)

## 2015-03-09 MED ORDER — PROPOFOL 1000 MG/100ML IV EMUL
INTRAVENOUS | Status: AC
  Filled 2015-03-09: qty 100

## 2015-03-09 MED ORDER — ROCURONIUM BROMIDE 50 MG/5ML IV SOLN
INTRAVENOUS | Status: AC
  Filled 2015-03-09: qty 2

## 2015-03-09 MED ORDER — LIDOCAINE HCL (CARDIAC) 20 MG/ML IV SOLN
INTRAVENOUS | Status: AC
  Filled 2015-03-09: qty 5

## 2015-03-09 MED ORDER — LORAZEPAM 2 MG/ML IJ SOLN
INTRAMUSCULAR | Status: AC
  Administered 2015-03-09: 1 mg via INTRAMUSCULAR
  Filled 2015-03-09: qty 1

## 2015-03-09 MED ORDER — SUCCINYLCHOLINE CHLORIDE 20 MG/ML IJ SOLN
INTRAMUSCULAR | Status: AC | PRN
  Administered 2015-03-09: 100 mg via INTRAVENOUS

## 2015-03-09 MED ORDER — CHLORHEXIDINE GLUCONATE 0.12 % MT SOLN
15.0000 mL | Freq: Two times a day (BID) | OROMUCOSAL | Status: DC
  Administered 2015-03-09 – 2015-03-12 (×6): 15 mL via OROMUCOSAL
  Filled 2015-03-09 (×6): qty 15

## 2015-03-09 MED ORDER — SODIUM CHLORIDE 0.9 % IV SOLN
200.0000 mg | Freq: Two times a day (BID) | INTRAVENOUS | Status: DC
  Administered 2015-03-09 – 2015-03-11 (×4): 200 mg via INTRAVENOUS
  Filled 2015-03-09 (×8): qty 20

## 2015-03-09 MED ORDER — GABAPENTIN 100 MG PO CAPS
100.0000 mg | ORAL_CAPSULE | Freq: Two times a day (BID) | ORAL | Status: DC
  Administered 2015-03-09 – 2015-03-11 (×3): 100 mg via ORAL
  Filled 2015-03-09 (×8): qty 1

## 2015-03-09 MED ORDER — BACITRACIN-NEOMYCIN-POLYMYXIN OINTMENT TUBE
1.0000 "application " | TOPICAL_OINTMENT | Freq: Two times a day (BID) | CUTANEOUS | Status: DC
  Administered 2015-03-09 – 2015-03-12 (×2): 1 via TOPICAL
  Filled 2015-03-09: qty 15

## 2015-03-09 MED ORDER — VALPROATE SODIUM 500 MG/5ML IV SOLN
250.0000 mg | Freq: Once | INTRAVENOUS | Status: AC
  Administered 2015-03-09: 250 mg via INTRAVENOUS
  Filled 2015-03-09: qty 2.5

## 2015-03-09 MED ORDER — LEVETIRACETAM 500 MG/5ML IV SOLN
1000.0000 mg | Freq: Two times a day (BID) | INTRAVENOUS | Status: DC
  Administered 2015-03-09 – 2015-03-11 (×4): 1000 mg via INTRAVENOUS
  Filled 2015-03-09 (×6): qty 10

## 2015-03-09 MED ORDER — SODIUM CHLORIDE 0.9 % IV SOLN
800.0000 mg | Freq: Once | INTRAVENOUS | Status: AC
  Administered 2015-03-09: 800 mg via INTRAVENOUS
  Filled 2015-03-09: qty 16

## 2015-03-09 MED ORDER — ADULT MULTIVITAMIN LIQUID CH
5.0000 mL | Freq: Every day | ORAL | Status: DC
  Administered 2015-03-09 – 2015-03-10 (×2): 5 mL via ORAL
  Filled 2015-03-09 (×4): qty 5

## 2015-03-09 MED ORDER — FREE WATER
150.0000 mL | Freq: Four times a day (QID) | Status: DC
  Administered 2015-03-10 (×3): 150 mL

## 2015-03-09 MED ORDER — CETYLPYRIDINIUM CHLORIDE 0.05 % MT LIQD
7.0000 mL | Freq: Four times a day (QID) | OROMUCOSAL | Status: DC
  Administered 2015-03-10 – 2015-03-12 (×10): 7 mL via OROMUCOSAL

## 2015-03-09 MED ORDER — VALPROATE SODIUM 500 MG/5ML IV SOLN
125.0000 mg | Freq: Three times a day (TID) | INTRAVENOUS | Status: DC
  Administered 2015-03-09 – 2015-03-11 (×5): 125 mg via INTRAVENOUS
  Filled 2015-03-09 (×7): qty 1.25

## 2015-03-09 MED ORDER — PROPOFOL 1000 MG/100ML IV EMUL
5.0000 ug/kg/min | INTRAVENOUS | Status: DC
  Administered 2015-03-09: 5 ug/kg/min via INTRAVENOUS
  Administered 2015-03-10: 15 ug/kg/min via INTRAVENOUS
  Filled 2015-03-09 (×2): qty 100

## 2015-03-09 MED ORDER — PROPOFOL 10 MG/ML IV BOLUS
20.0000 mg | Freq: Once | INTRAVENOUS | Status: AC
  Administered 2015-03-09: 20 mg via INTRAVENOUS

## 2015-03-09 MED ORDER — IOHEXOL 350 MG/ML SOLN
50.0000 mL | Freq: Once | INTRAVENOUS | Status: AC | PRN
  Administered 2015-03-09: 50 mL via INTRAVENOUS

## 2015-03-09 MED ORDER — SODIUM CHLORIDE 0.9 % IV SOLN
INTRAVENOUS | Status: DC
  Administered 2015-03-11: 22:00:00 via INTRAVENOUS

## 2015-03-09 MED ORDER — HEPARIN SODIUM (PORCINE) 5000 UNIT/ML IJ SOLN
5000.0000 [IU] | Freq: Three times a day (TID) | INTRAMUSCULAR | Status: DC
  Administered 2015-03-09 – 2015-03-11 (×5): 5000 [IU] via SUBCUTANEOUS
  Filled 2015-03-09 (×8): qty 1

## 2015-03-09 MED ORDER — PROPOFOL 1000 MG/100ML IV EMUL
5.0000 ug/kg/min | INTRAVENOUS | Status: DC
  Administered 2015-03-09: 5 ug/kg/min via INTRAVENOUS

## 2015-03-09 MED ORDER — SUCCINYLCHOLINE CHLORIDE 20 MG/ML IJ SOLN
INTRAMUSCULAR | Status: AC
  Filled 2015-03-09: qty 1

## 2015-03-09 MED ORDER — FAMOTIDINE 20 MG PO TABS
20.0000 mg | ORAL_TABLET | Freq: Two times a day (BID) | ORAL | Status: DC
  Administered 2015-03-09 – 2015-03-11 (×3): 20 mg via ORAL
  Filled 2015-03-09 (×8): qty 1

## 2015-03-09 MED ORDER — ETOMIDATE 2 MG/ML IV SOLN
INTRAVENOUS | Status: AC | PRN
  Administered 2015-03-09: 20 mg via INTRAVENOUS

## 2015-03-09 MED ORDER — PROPOFOL BOLUS VIA INFUSION
20.0000 mg | Freq: Once | INTRAVENOUS | Status: AC
  Administered 2015-03-09: 20 mg via INTRAVENOUS

## 2015-03-09 MED ORDER — ETOMIDATE 2 MG/ML IV SOLN
INTRAVENOUS | Status: AC
  Filled 2015-03-09: qty 20

## 2015-03-09 NOTE — ED Provider Notes (Signed)
INTUBATION Performed by: Geronimo Boot  Required items: required blood products, implants, devices, and special equipment available Patient identity confirmed: provided demographic data and hospital-assigned identification number Time out: Immediately prior to procedure a "time out" was called to verify the correct patient, procedure, equipment, support staff and site/side marked as required.  Indications: Airway protection with AMS  Intubation method: Direct Laryngoscopy   Preoxygenation: BVM  Sedatives: 20 Etomidate Paralytic: 100Succinylcholine  Tube Size: 7.5 cuffed  Post-procedure assessment: chest rise and ETCO2 monitor Breath sounds: equal and absent over the epigastrium Tube secured with: ETT holder Chest x-ray interpreted by radiologist and me.  Chest x-ray findings: endotracheal tube in appropriate position  Patient tolerated the procedure well with no immediate complications.     Geronimo Boot, MD 03/10/15 [place], MD 03/10/15 2225

## 2015-03-09 NOTE — Consult Note (Signed)
Neurology Consultation Reason for Consult: Code stroke Referring Physician: Rancour, S  CC: Code stroke  History is obtained from: Patient  HPI: Lovetta Condie is a 79 y.o. female with a history of seizures as well as multiple strokes who presents with sudden onset right-sided weakness and aphasia. She has a history of partial complex seizures as well as multiple previous strokes. She was in her normal state of health which does include a significant aphasia, though she is able to be understood to some degree and she understands things readily. She normally walks around on her own, and when the nurses went to get her for lunch they found that she was not able to stand or move her leg and therefore she was brought to the emergency room. Once here, she was taken for a head CT which was negative for changes. She then had a CT angiogram which did not show any large vessel occlusion. During the CT angiogram, she began to have clonic activity of the right arm and therefore she was given 1 mg IM as the IV infiltrated during the CT angiogram and therefore she did not have access.  After obtaining IV access, she continued to have hemiparesis and she was given another milligram IV Ativan and loaded with Dilantin.   Of note she has a history of refractory partial seizures in July 2015 that was felt to be secondary to a pneumonia causing lower seizure threshold.  Of note, she was given ativan for a spell of agitation this morning 0.5 mg    also of note, she was denying her medications yesterday evening and did not take them, she did take her medicines this morning after being prompted to.   LKW: 12 PM tpa given?: no, on eliquis    ROS:  Unable to obtain due to altered mental status.   Past Medical History  Diagnosis Date  . Seizures   . Hypotension   . Pneumonia   . Stroke   . GI bleed   . Pulmonary embolism   . Gastrostomy tube in place   . Memory loss     Family History:  hypertension    Social History: Tob:  former smoker   Exam: Current vital signs: BP 148/98 mmHg  Pulse 74  Resp 16  SpO2 100% Vital signs in last 24 hours: Pulse Rate:  [74] 74 (07/24 1430) Resp:  [16] 16 (07/24 1430) BP: (148)/(98) 148/98 mmHg (07/24 1430) SpO2:  [100 %] 100 % (07/24 1430)   Physical Exam  Constitutional: Appears well-developed and well-nourished.  Psych: Affect appropriate to situation Eyes: No scleral injection HENT: No OP obstrucion Head: Normocephalic.  Cardiovascular: Normal rate and regular rhythm.  Respiratory: Effort normal and breath sounds normal to anterior ascultation GI: Soft.  No distension. There is no tenderness.  Skin: WDI  Neuro: Mental Status: Patient is awake, alert, she has a very dense receptive aphasia  Cranial Nerves: II:  she does not blink to threat from the right Pupils are equal, round, and reactive to light.   III,IV, VI:  initially she appeared to have a left gaze preference, but subsequently this was no longer seen  V: she responds to stimuli bilaterally VII: Facial movement is right facial weakness VIII, X, XI, XII: Unable to assess secondary to patient's altered mental status.  Motor: Tone is flaccid in the right  Bulk is normal. 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 given that she is on rather large doses of all of her medications with the exception of Depakote, but levels were not available I decided to use phenytoin. Given that further information, however, of medication noncompliance I do not think this will need  to be a long-term medication.  She has had marked improvement in her right-sided weakness which I suspect means that she is no longer having ongoing status. however, given that she has not returned to her normal ability to speak and understand we'll obtain a stat EEG to rule out ongoing partial status epilepticus.  Recommendations: 1)  Resume home medications given the seizures are occurring in the setting of medication noncompliance Keppra 1 g twice a day  2) gabapentin 100 mg twice a day  3) Depakote 125 mg twice a day 4) vimpat 200mg  BID 5) stat EEG   This patient is critically ill and at significant risk of neurological worsening, death and care requires constant monitoring of vital signs, hemodynamics,respiratory and cardiac monitoring, neurological assessment, discussion with family, other specialists and medical decision making of high complexity. I spent 60 minutes of neurocritical care time  in the care of  this patient.  Roland Rack, MD [facility] [phone]  If 7pm- 7am, please page neurology on call as listed in [place]. 03/09/2015  5:00 PM

## 2015-03-09 NOTE — ED Notes (Signed)
Per EMS - pt was seen by family at 3 and was normal and at 12:30 pt had increased right sided weakness and worsening aphasia from previous CVA. Per EMS pt was given ativan at facility at 9 am for cursing at staff and being aggressive, which is out of her ordinary. On arrival to ED pt cleared by EDP to head to CT, pt aphasic and unable to answer questions. Pt is making crowing noises at staff. Pt also not following commands.

## 2015-03-09 NOTE — ED Notes (Signed)
Pt attempting to pull EJ line out of neck with left hand, right hand not strong enough to move towards neck. MD Leonel Ramsay at bedside with pt and giving this RN verbal order to place restraints. ED does not have soft wrist so gurney cuff placed to pt left wrist. Circulation intact and no skin issues present.

## 2015-03-09 NOTE — ED Notes (Signed)
EEG at bedside.

## 2015-03-09 NOTE — ED Notes (Addendum)
MD Rancour at bedside preparing to intubate

## 2015-03-09 NOTE — ED Provider Notes (Signed)
CSN: [id number]     Arrival date & time 03/09/15  1343 History   First MD Initiated Contact with Patient 03/09/15 1345     Chief Complaint  Patient presents with  . Code Stroke     (Consider location/radiation/quality/duration/timing/severity/associated sxs/prior Treatment) HPI Comments: Code stroke via EMS.  Sudden onset of worsening R sided weakness, slurred speech, aphasia, that onset 30 minutes ago.  Hx stroke with R sided deficits, on eliquis.  Patient is not able to answer questions appropriately. She is speaking gibberish. She is following some commands. She is moving the left side of her body more than right. Protecting her airway and taken to CT at this time.  The history is provided by the patient, a relative and the EMS personnel. The history is limited by the condition of the patient.    Past Medical History  Diagnosis Date  . Seizures   . Hypotension   . Pneumonia   . Stroke   . GI bleed   . Pulmonary embolism   . Gastrostomy tube in place   . Memory loss    Past Surgical History  Procedure Laterality Date  . Appendectomy    . Jejunostomy feeding tube     Family History  Problem Relation Age of Onset  . High blood pressure Mother   . Cancer    . Stroke     History  Substance Use Topics  . Smoking status: Former Smoker -- 0.50 packs/day for 15 years  . Smokeless tobacco: Never Used  . Alcohol Use: No   OB History    No data available     Review of Systems  Unable to perform ROS     Allergies  Codeine; Lipitor; Aspirin; Hctz; Hydralazine; Reserpine; and Tramadol  Home Medications   Prior to Admission medications   Medication Sig Start Date End Date Taking? Authorizing Provider  acetaminophen (TYLENOL) 325 MG tablet Take 2 tablets (650 mg total) by mouth every 6 (six) hours as needed for mild pain or fever. 07/22/14  Yes Bonnielee Haff, MD  albuterol (PROVENTIL) (2.5 MG/3ML) 0.083% nebulizer solution Take 2.5 mg by nebulization every 2 (two) hours  as needed for wheezing or shortness of breath.   Yes Historical Provider, MD  apixaban (ELIQUIS) 5 MG TABS tablet Take 1 tablet (5 mg total) by mouth 2 (two) times daily. 07/22/14  Yes Bonnielee Haff, MD  divalproex (DEPAKOTE SPRINKLE) 125 MG capsule Take 125 mg by mouth 2 (two) times daily.  04/01/14 04/01/15 Yes Historical Provider, MD  famotidine (PEPCID) 20 MG tablet Take 20 mg by mouth 2 (two) times daily.   Yes Historical Provider, MD  ferrous sulfate 325 (65 FE) MG tablet Take 1 tablet (325 mg total) by mouth daily. 07/22/14  Yes Bonnielee Haff, MD  furosemide (LASIX) 40 MG tablet Take 1 tablet (40 mg total) by mouth daily as needed (for leg swelling.  Give PRN potassium when Lasix is given). 07/22/14  Yes Bonnielee Haff, MD  gabapentin (NEURONTIN) 100 MG capsule Take 100 mg by mouth 2 (two) times daily.   Yes Historical Provider, MD  lacosamide (VIMPAT) 200 MG TABS tablet Take 1 tablet (200 mg total) by mouth 2 (two) times daily. 07/22/14  Yes Bonnielee Haff, MD  levETIRAcetam (KEPPRA) 1000 MG tablet Take 1,000 mg by mouth 2 (two) times daily.   Yes Historical Provider, MD  magnesium oxide (MAG-OX) 400 MG tablet Take 1 tablet (400 mg total) by mouth 3 (three) times daily. Patient taking differently:  Take 400 mg by mouth daily.  07/22/14  Yes Bonnielee Haff, MD  metoprolol tartrate (LOPRESSOR) 12.5 mg TABS tablet Take 1.5 tablets (37.5 mg total) by mouth 2 (two) times daily. 07/22/14  Yes Bonnielee Haff, MD  Multiple Vitamin (MULTIVITAMIN WITH MINERALS) TABS tablet Take 1 tablet by mouth daily.   Yes Historical Provider, MD  ondansetron (ZOFRAN) 4 MG tablet Take 1 tablet (4 mg total) by mouth every 4 (four) hours as needed for nausea. 07/22/14  Yes Bonnielee Haff, MD  PRESCRIPTION MEDICATION Apply 1 mg topically every 6 (six) hours as needed (ATIVAN GEL for agitation).   Yes Historical Provider, MD  feeding supplement, ENSURE COMPLETE, (ENSURE COMPLETE) LIQD Take 237 mLs by mouth 3 (three) times daily  between meals. 07/22/14   Bonnielee Haff, MD  levETIRAcetam (KEPPRA) 750 MG tablet Take 1 tablet (750 mg total) by mouth 2 (two) times daily. Patient taking differently: Take 1,000 mg by mouth 2 (two) times daily.  07/22/14   Bonnielee Haff, MD  LORazepam (ATIVAN) 0.5 MG tablet Take 1 tablet (0.5 mg total) by mouth every 6 (six) hours as needed (for agitation). 07/22/14   Bonnielee Haff, MD  Multiple Vitamin (MULTIVITAMIN) LIQD Take 5 mLs by mouth daily. 07/22/14   Bonnielee Haff, MD  neomycin-bacitracin-polymyxin (NEOSPORIN) OINT Apply 1 application topically 2 (two) times daily. Apply to skin around PEG tube insertion site. 07/22/14   Bonnielee Haff, MD  potassium chloride SA (KLOR-CON M20) 20 MEQ tablet Take 1 tablet (20 mEq total) by mouth daily as needed (whenever she is given Lasix). 07/22/14   Bonnielee Haff, MD  Water For Irrigation, Sterile (FREE WATER) SOLN Place 150 mLs into feeding tube every 6 (six) hours. 05/20/14   Hosie Poisson, MD   BP 112/66 mmHg  Pulse 72  Resp 14  Wt 93 lb 8 oz (42.411 kg)  SpO2 100% Physical Exam  Constitutional: She appears well-developed and well-nourished. No distress.  HENT:  Head: Normocephalic and atraumatic.  Mouth/Throat: Oropharynx is clear and moist. No oropharyngeal exudate.  L gaze preference  Eyes: Conjunctivae and EOM are normal. Pupils are equal, round, and reactive to light.  Neck: Normal range of motion.  Cardiovascular: Normal rate, regular rhythm and normal heart sounds.   No murmur heard. Pulmonary/Chest: Effort normal and breath sounds normal. No respiratory distress. She exhibits no tenderness.  Abdominal: Soft. There is no tenderness. There is no rebound and no guarding.  Musculoskeletal: Normal range of motion. She exhibits no edema or tenderness.  Neurological: She is alert. A cranial nerve deficit is present.  R sided weakness.  Unable to hold either leg off bed.  Receptive aphasia.  Not moving R side. Speaking a few words of  nonsense    ED Course  Procedures (including critical care time) Labs Review Labs Reviewed  PROTIME-INR - Abnormal; Notable for the following:    Prothrombin Time 17.6 (*)    All other components within normal limits  DIFFERENTIAL - Abnormal; Notable for the following:    Neutrophils Relative % 81 (*)    All other components within normal limits  COMPREHENSIVE METABOLIC PANEL - Abnormal; Notable for the following:    Glucose, Bld 115 (*)    BUN 21 (*)    ALT 12 (*)    GFR calc non Af Amer 56 (*)    All other components within normal limits  URINALYSIS, ROUTINE W REFLEX MICROSCOPIC (NOT AT [facility]) - Abnormal; Notable for the following:    APPearance CLOUDY (*)  Glucose, UA 250 (*)    Hgb urine dipstick TRACE (*)    Ketones, ur 15 (*)    Leukocytes, UA MODERATE (*)    All other components within normal limits  VALPROIC ACID LEVEL - Abnormal; Notable for the following:    Valproic Acid Lvl 36 (*)    All other components within normal limits  I-STAT CHEM 8, ED - Abnormal; Notable for the following:    BUN 30 (*)    Glucose, Bld 119 (*)    All other components within normal limits  CBG MONITORING, ED - Abnormal; Notable for the following:    Glucose-Capillary 124 (*)    All other components within normal limits  APTT  CBC  URINE RAPID DRUG SCREEN, HOSP PERFORMED  URINE MICROSCOPIC-ADD ON  ETHANOL  LEVETIRACETAM LEVEL  I-STAT TROPOININ, ED    Imaging Review Ct Angio Head W/cm &/or Wo Cm  03/09/2015   CLINICAL DATA:  79 year old female with right-sided weakness. History of seizures, memory loss and prior stroke. Subsequent encounter.  EXAM: CT ANGIOGRAPHY HEAD AND NECK  TECHNIQUE: Multidetector CT imaging of the head and neck was performed using the standard protocol during bolus administration of intravenous contrast. Multiplanar CT image reconstructions and MIPs were obtained to evaluate the vascular anatomy. Carotid stenosis measurements (when applicable) are obtained  utilizing NASCET criteria, using the distal internal carotid diameter as the denominator.  CONTRAST:  40mL OMNIPAQUE IOHEXOL 350 MG/ML SOLN  COMPARISON:  03/09/2015 head CT.  07/17/2004 brain MR.  FINDINGS: CT HEAD  Brain: Remote left posterior frontal -parietal lobe infarct with encephalomalacia.  No CT evidence of large acute infarct.  No intracranial hemorrhage.  No intracranial enhancing lesion.  No hydrocephalus.  Calvarium and skull base: Negative  Paranasal sinuses: Clear  Orbits: Post lens replacement otherwise negative.  CTA NECK  Aortic arch: 3 vessel arch with atherosclerotic type changes aortic arch and origin of great vessels without high-grade stenosis.  Right carotid system: Ectatic right common carotid artery. Plaque right carotid bifurcation with 72% diameter stenosis proximal right internal carotid artery. Markedly ectatic distal vertical cervical segment right internal carotid artery.  Left carotid system: Ectatic left common carotid artery and left internal carotid artery. Calcified plaque proximal left internal carotid artery with 44% maximal diameter stenosis.  Vertebral arteries:Fold/kink right subclavian artery with moderate narrowing. Right vertebral artery is dominant and ectatic with areas of narrowing without high-grade stenosis. Ectatic left vertebral artery with areas of mild moderate irregularity and mild narrowing.  Skeleton: Cervical spondylotic changes most notable C5-6 and C6-7.  Other neck: Motion degradation without worrisome neck mass. The ectatic common carotid arteries traversed posteriorly immediately adjacent to the cervical esophagus. No worrisome lung apical lesion.  CTA HEAD  Anterior circulation: Mild narrowing petrous segment internal carotid artery bilaterally. Calcified plaque cavernous segment internal carotid artery bilaterally with mild narrowing on the right and mild slightly moderate narrowing on the left.  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  Mild kink at the middle cerebral artery bifurcation bilaterally with mild narrowing greater on the left. No significant stenosis of the M1 segment of either middle cerebral artery. Decrease number branch vessels left middle cerebral artery consistent with patient's remote infarct.  Posterior circulation: Left vertebral artery ends in a posterior inferior cerebellar artery distribution. No high-grade stenosis of the right vertebral artery or basilar artery. Mild posterior cerebral artery distal branch vessel irregularity bilaterally.  Poor delineation anterior inferior cerebral arteries.  Venous sinuses: Negative  Anatomic variants: Negative  Delayed phase: Negative  IMPRESSION: CT HEAD  Remote left posterior frontal -parietal lobe infarct with encephalomalacia.  No CT evidence of large acute infarct.  CTA NECK  Plaque right carotid bifurcation with 72% diameter stenosis proximal right internal carotid artery.  Calcified plaque proximal left internal carotid artery with 44% maximal diameter stenosis.  Fold/kink right subclavian artery with moderate narrowing.  Right vertebral artery is dominant and ectatic with areas of narrowing without high-grade stenosis.  Ectatic left vertebral artery with areas of mild to moderate irregularity and mild narrowing.  CTA HEAD  Calcified plaque cavernous segment internal carotid artery bilaterally with mild narrowing on the right and mild slightly moderate narrowing on the left.  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  Mild kink at the middle cerebral artery bifurcation bilaterally with mild narrowing greater on the left. No significant stenosis of the M1 segment of either middle cerebral artery. Decrease number branch vessels left middle cerebral artery consistent with patient's remote infarct.  Left vertebra artery ends in a posterior inferior cerebellar artery distribution.  No high-grade stenosis of the right vertebral artery or basilar artery.  Mild  posterior cerebral artery distal branch vessel irregularity bilaterally.  Poor delineation anterior inferior cerebral arteries.  Images reviewed preliminarily at the time of imaging with Dr. Leonel Ramsay.  Patient with infiltration (saline) evaluated by Dr. Wyvonnia Dusky of emergency room staff.   Electronically Signed   By: Genia Del M.D.   On: 03/09/2015 14:54   Ct Head Wo Contrast  03/09/2015   CLINICAL DATA:  Increased right side weakness. Aphasia.  EXAM: CT HEAD WITHOUT CONTRAST  TECHNIQUE: Contiguous axial images were obtained from the base of the skull through the vertex without intravenous contrast.  COMPARISON:  Brain MRI 07/17/2014.  Head CT scan 07/16/2014.  FINDINGS: Atrophy and chronic microvascular ischemic change are again identified. Remote infarct in the posterior left MCA territory is again seen. There is no evidence of acute infarction, hemorrhage, mass lesion, mass effect, midline shift or abnormal extra-axial fluid collection. No hydrocephalus or pneumocephalus. The calvarium is intact. Imaged paranasal sinuses and mastoid air cells are clear. Atherosclerosis noted.  IMPRESSION: No acute abnormality.  Atrophy, chronic microvascular ischemic change remote posterior left MCA infarct.  Critical Value/emergent results were called by telephone at the time of interpretation on 03/09/2015 at 1:54 pm to Dr. Ezequiel Essex , who verbally acknowledged these results.   Electronically Signed   By: Inge Rise M.D.   On: 03/09/2015 13:55   Ct Angio Neck W/cm &/or Wo/cm  03/09/2015   CLINICAL DATA:  79 year old female with right-sided weakness. History of seizures, memory loss and prior stroke. Subsequent encounter.  EXAM: CT ANGIOGRAPHY HEAD AND NECK  TECHNIQUE: Multidetector CT imaging of the head and neck was performed using the standard protocol during bolus administration of intravenous contrast. Multiplanar CT image reconstructions and MIPs were obtained to evaluate the vascular anatomy. Carotid  stenosis measurements (when applicable) are obtained utilizing NASCET criteria, using the distal internal carotid diameter as the denominator.  CONTRAST:  37mL OMNIPAQUE IOHEXOL 350 MG/ML SOLN  COMPARISON:  03/09/2015 head CT.  07/17/2004 brain MR.  FINDINGS: CT HEAD  Brain: Remote left posterior frontal -parietal lobe infarct with encephalomalacia.  No CT evidence of large acute infarct.  No intracranial hemorrhage.  No intracranial enhancing lesion.  No hydrocephalus.  Calvarium and skull base: Negative  Paranasal sinuses: Clear  Orbits: Post lens replacement otherwise negative.  CTA NECK  Aortic  arch: 3 vessel arch with atherosclerotic type changes aortic arch and origin of great vessels without high-grade stenosis.  Right carotid system: Ectatic right common carotid artery. Plaque right carotid bifurcation with 72% diameter stenosis proximal right internal carotid artery. Markedly ectatic distal vertical cervical segment right internal carotid artery.  Left carotid system: Ectatic left common carotid artery and left internal carotid artery. Calcified plaque proximal left internal carotid artery with 44% maximal diameter stenosis.  Vertebral arteries:Fold/kink right subclavian artery with moderate narrowing. Right vertebral artery is dominant and ectatic with areas of narrowing without high-grade stenosis. Ectatic left vertebral artery with areas of mild moderate irregularity and mild narrowing.  Skeleton: Cervical spondylotic changes most notable C5-6 and C6-7.  Other neck: Motion degradation without worrisome neck mass. The ectatic common carotid arteries traversed posteriorly immediately adjacent to the cervical esophagus. No worrisome lung apical lesion.  CTA HEAD  Anterior circulation: Mild narrowing petrous segment internal carotid artery bilaterally. Calcified plaque cavernous segment internal carotid artery bilaterally with mild narrowing on the right and mild slightly moderate narrowing on the left.   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  Mild kink at the middle cerebral artery bifurcation bilaterally with mild narrowing greater on the left. No significant stenosis of the M1 segment of either middle cerebral artery. Decrease number branch vessels left middle cerebral artery consistent with patient's remote infarct.  Posterior circulation: Left vertebral artery ends in a posterior inferior cerebellar artery distribution. No high-grade stenosis of the right vertebral artery or basilar artery. Mild posterior cerebral artery distal branch vessel irregularity bilaterally.  Poor delineation anterior inferior cerebral arteries.  Venous sinuses: Negative  Anatomic variants: Negative  Delayed phase: Negative  IMPRESSION: CT HEAD  Remote left posterior frontal -parietal lobe infarct with encephalomalacia.  No CT evidence of large acute infarct.  CTA NECK  Plaque right carotid bifurcation with 72% diameter stenosis proximal right internal carotid artery.  Calcified plaque proximal left internal carotid artery with 44% maximal diameter stenosis.  Fold/kink right subclavian artery with moderate narrowing.  Right vertebral artery is dominant and ectatic with areas of narrowing without high-grade stenosis.  Ectatic left vertebral artery with areas of mild to moderate irregularity and mild narrowing.  CTA HEAD  Calcified plaque cavernous segment internal carotid artery bilaterally with mild narrowing on the right and mild slightly moderate narrowing on the left.  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  Mild kink at the middle cerebral artery bifurcation bilaterally with mild narrowing greater on the left. No significant stenosis of the M1 segment of either middle cerebral artery. Decrease number branch vessels left middle cerebral artery consistent with patient's remote infarct.  Left vertebra artery ends in a posterior inferior cerebellar artery distribution.  No high-grade stenosis of  the right vertebral artery or basilar artery.  Mild posterior cerebral artery distal branch vessel irregularity bilaterally.  Poor delineation anterior inferior cerebral arteries.  Images reviewed preliminarily at the time of imaging with Dr. Leonel Ramsay.  Patient with infiltration (saline) evaluated by Dr. Wyvonnia Dusky of emergency room staff.   Electronically Signed   By: Genia Del M.D.   On: 03/09/2015 14:54   Dg Chest Portable 1 View  03/09/2015   CLINICAL DATA:  Hypoxia  EXAM: PORTABLE CHEST - 1 VIEW  COMPARISON:  Study obtained earlier in the day  FINDINGS: Endotracheal tube tip is 5 mm above the carina currently. No pneumothorax. There is no edema or consolidation. Heart size and pulmonary vascular normal. No  adenopathy. No bone lesions. There is atherosclerotic change in aorta.  IMPRESSION: Endotracheal tube tip is 5 mm above carina. Advise withdrawing endotracheal tube approximately 3 cm. No pneumothorax. No lung edema or consolidation.   Electronically Signed   By: Lowella Grip III M.D.   On: 03/09/2015 17:03   Dg Chest Portable 1 View  03/09/2015   CLINICAL DATA:  Endotracheal tube placement  EXAM: PORTABLE CHEST - 1 VIEW  COMPARISON:  07/17/2014  FINDINGS: Endotracheal tube with the tip projecting over the right mainstem bronchus. Recommend retracting the endotracheal tube 5.5 cm.  There is no focal parenchymal opacity. There is no pleural effusion or pneumothorax. The heart and mediastinal contours are unremarkable.  The osseous structures are unremarkable.  IMPRESSION: Endotracheal tube with the tip projecting over the right mainstem bronchus. Recommend retracting the endotracheal tube 5.5 cm. These results were called by telephone at the time of interpretation on 03/09/2015 at 4:14 pm to Dr. Ezequiel Essex , who verbally acknowledged these results.   Electronically Signed   By: Kathreen Devoid   On: 03/09/2015 16:19     EKG Interpretation   Date/Time:  Sunday March 09 2015 14:27:30  EDT Ventricular Rate:  74 PR Interval:  184 QRS Duration: 102 QT Interval:  398 QTC Calculation: 442 R Axis:   50 Text Interpretation:  Sinus rhythm Right atrial enlargement S1,S2,S3  pattern No significant change was found Confirmed by [place]  [phone]) on 03/09/2015 3:13:20 PM      MDM   Final diagnoses:  Status epilepticus  Acute respiratory failure, unspecified whether with hypoxia or hypercapnia   Code stroke with worsening R sided weakness and aphasia.  Seen with Dr. Leonel Ramsay on arrival.   CT negative for hemorrhage or large infarct.  Per neuro, CTA to assess for large vessel occlusion.  No systemic tPA given eliquis use.  IV infiltrated during CTA.  40 cc of saline into R forearm per radiology.  Contrast reportedly did not extravasate.  Warm compress applied.  Forearm soft.  No intervenable lesions on CTA per neuro.  Patient with seizure activity, ativan given. Dilantin loading dose. Patient starting to move R side.  But more obtunded after ativan and dilantin.  Decision to intubate for airway protection. Performed by Dr. Alcide Evener with my supervision.  Propofol for sedation. EEG per neuro.  D/w PCCM who will admit.  Family updated.   Angiocath insertion Performed by: Ezequiel Essex  Consent: Verbal consent obtained. Risks and benefits: risks, benefits and alternatives were discussed Time out: Immediately prior to procedure a "time out" was called to verify the correct patient, procedure, equipment, support staff and site/side marked as required.  Preparation: Patient was prepped and draped in the usual sterile fashion.  Vein Location: L EJ  NOt Ultrasound Guided  Gauge: [place]  Normal blood return and flush without difficulty Patient tolerance: Patient tolerated the procedure well with no immediate complications.    CRITICAL CARE Performed by: Ezequiel Essex Total critical care time: 60 Critical care time was exclusive of separately billable  procedures and treating other patients. Critical care was necessary to treat or prevent imminent or life-threatening deterioration. Critical care was time spent personally by me on the following activities: development of treatment plan with patient and/or surrogate as well as nursing, discussions with consultants, evaluation of patient's response to treatment, examination of patient, obtaining history from patient or surrogate, ordering and performing treatments and interventions, ordering and review of laboratory studies, ordering and review of  radiographic studies, pulse oximetry and re-evaluation of patient's condition.   Ezequiel Essex, MD 03/09/15 1736

## 2015-03-09 NOTE — Procedures (Signed)
History: 79 yo F presenting with right sided weakness and aphasia, developed seizures in teh ER, r/o status epilepticus.   Sedation: Propofol  Technique: This is a 19 channel routine scalp EEG performed at the bedside with bipolar and monopolar montages arranged in accordance to the international 10/20 system of electrode placement. One channel was dedicated to EKG recording.    Background: The background consists of low voltage irregular delta activity with some superimposed structures resembling sleep structures. This is present throughout the recording. There is no posterior dominant rhythm visualized.   Photic stimulation: Physiologic driving is not performed  EEG Abnormalities: 1) Generalized irregular slow activity.   Clinical Interpretation: This EEG is consistent with a generalized non-specific cerebral dysfunction(encephalopathy). There was no seizure or seizure predisposition recorded on this study.   Roland Rack, MD [facility] [phone]  If 7pm- 7am, please page neurology on call as listed in [place].

## 2015-03-09 NOTE — Progress Notes (Signed)
Stat EEG completed; Dr Leonel Ramsay viewed; results pending.

## 2015-03-09 NOTE — ED Notes (Signed)
Pt waking up and attempting to pull ETT out. Per MD Rancour give 20 mg propofol bolus.

## 2015-03-09 NOTE — ED Notes (Addendum)
Pt becoming more lethargic and more difficult to arouse. MD Rancour at bedside to assess pt. Pt also snoring and drooling, not protecting airway well.

## 2015-03-09 NOTE — ED Notes (Signed)
Pt continues to attempt to pull ETT out. Per MD Rancour give a second 20 mg bolus of propofol.

## 2015-03-09 NOTE — ED Notes (Signed)
Verbal order for 1 mg ativan to be given IM due to clonic like activity by MD Rancour.

## 2015-03-09 NOTE — Progress Notes (Signed)
Patient had approx. 81ml of saline infiltrate by pressure injector at the end of their CTA scan on the right forearm. Dr. Irish Elders was notified. Dr. Leonel Ramsay saw the infiltrate. Dr. Wyvonnia Dusky is the attending Physician and was also notified to evaluate the patients infiltrate. Patient was a code stroke and went immediately back to the ER after the scan.

## 2015-03-09 NOTE — H&P (Signed)
PULMONARY / CRITICAL CARE MEDICINE   Name: Tanya White MRN: [id number] DOB: 02-17-1936    ADMISSION DATE:  03/09/2015 CONSULTATION DATE:  03/09/15  REFERRING MD :  Dr Tanya White, Neurology  CHIEF COMPLAINT:  VDRF  INITIAL PRESENTATION: 21 w hx CVA's and complex partial seizures. Admitted with R weakness and worsening aphasia, workup consistent with recurrent seizures and status epilepticus. She became obtunded after ativan and dilantin, required intubation. Admitted to ICU  STUDIES:  Head CT 7/24 >> no acute abnormality, atrophy, remote L MCA CVA CT angio Head and neck 7/24 >> no acute infarct, no large vessel occlusion  SIGNIFICANT EVENTS: Intubated 7/24   HISTORY OF PRESENT ILLNESS:  Obtained from notes as pt cannot participate Tanya White is a 79 y.o. female with a history of PE on Eliquis, seizures as well as multiple strokes who presented with sudden onset right-sided weakness and aphasia. She has a history of partial complex seizures as well as multiple previous strokes. She was in her normal state of health which does include a significant aphasia, though she is able to be understood to some degree and she understands things readily. She normally walks around on her own, and when the nurses went to get her for lunch they found that she was not able to stand or move her leg and therefore she was brought to the emergency room. Once here, she was taken for a head CT which was negative for changes. She then had a CT angiogram which did not show any large vessel occlusion. During the CT angiogram, she began to have clonic activity of the right arm and received ativan. Per notes she did not take her usual meds on evening of 7/23. She was intubated for airway protection in the ED. PCCM called to admit the patient.    PAST MEDICAL HISTORY :   has a past medical history of Seizures; Hypotension; Pneumonia; Stroke; GI bleed; Pulmonary embolism; Gastrostomy tube in place; and Memory  loss.  has past surgical history that includes Appendectomy and Jejunostomy feeding tube. Prior to Admission medications   Medication Sig Start Date End Date Taking? Authorizing Provider  acetaminophen (TYLENOL) 325 MG tablet Take 2 tablets (650 mg total) by mouth every 6 (six) hours as needed for mild pain or fever. 07/22/14  Yes Tanya Haff, MD  albuterol (PROVENTIL) (2.5 MG/3ML) 0.083% nebulizer solution Take 2.5 mg by nebulization every 2 (two) hours as needed for wheezing or shortness of breath.   Yes Historical Provider, MD  apixaban (ELIQUIS) 5 MG TABS tablet Take 1 tablet (5 mg total) by mouth 2 (two) times daily. 07/22/14  Yes Tanya Haff, MD  divalproex (DEPAKOTE SPRINKLE) 125 MG capsule Take 125 mg by mouth 2 (two) times daily.  04/01/14 04/01/15 Yes Historical Provider, MD  famotidine (PEPCID) 20 MG tablet Take 20 mg by mouth 2 (two) times daily.   Yes Historical Provider, MD  ferrous sulfate 325 (65 FE) MG tablet Take 1 tablet (325 mg total) by mouth daily. 07/22/14  Yes Tanya Haff, MD  furosemide (LASIX) 40 MG tablet Take 1 tablet (40 mg total) by mouth daily as needed (for leg swelling.  Give PRN potassium when Lasix is given). 07/22/14  Yes Tanya Haff, MD  gabapentin (NEURONTIN) 100 MG capsule Take 100 mg by mouth 2 (two) times daily.   Yes Historical Provider, MD  lacosamide (VIMPAT) 200 MG TABS tablet Take 1 tablet (200 mg total) by mouth 2 (two) times daily. 07/22/14  Yes Tanya Haff, MD  levETIRAcetam (KEPPRA) 1000 MG tablet Take 1,000 mg by mouth 2 (two) times daily.   Yes Historical Provider, MD  magnesium oxide (MAG-OX) 400 MG tablet Take 1 tablet (400 mg total) by mouth 3 (three) times daily. Patient taking differently: Take 400 mg by mouth daily.  07/22/14  Yes Tanya Haff, MD  metoprolol tartrate (LOPRESSOR) 12.5 mg TABS tablet Take 1.5 tablets (37.5 mg total) by mouth 2 (two) times daily. 07/22/14  Yes Tanya Haff, MD  Multiple Vitamin (MULTIVITAMIN WITH  MINERALS) TABS tablet Take 1 tablet by mouth daily.   Yes Historical Provider, MD  ondansetron (ZOFRAN) 4 MG tablet Take 1 tablet (4 mg total) by mouth every 4 (four) hours as needed for nausea. 07/22/14  Yes Tanya Haff, MD  PRESCRIPTION MEDICATION Apply 1 mg topically every 6 (six) hours as needed (ATIVAN GEL for agitation).   Yes Historical Provider, MD  feeding supplement, ENSURE COMPLETE, (ENSURE COMPLETE) LIQD Take 237 mLs by mouth 3 (three) times daily between meals. 07/22/14   Tanya Haff, MD  levETIRAcetam (KEPPRA) 750 MG tablet Take 1 tablet (750 mg total) by mouth 2 (two) times daily. Patient taking differently: Take 1,000 mg by mouth 2 (two) times daily.  07/22/14   Tanya Haff, MD  LORazepam (ATIVAN) 0.5 MG tablet Take 1 tablet (0.5 mg total) by mouth every 6 (six) hours as needed (for agitation). 07/22/14   Tanya Haff, MD  Multiple Vitamin (MULTIVITAMIN) LIQD Take 5 mLs by mouth daily. 07/22/14   Tanya Haff, MD  neomycin-bacitracin-polymyxin (NEOSPORIN) OINT Apply 1 application topically 2 (two) times daily. Apply to skin around PEG tube insertion site. 07/22/14   Tanya Haff, MD  potassium chloride SA (KLOR-CON M20) 20 MEQ tablet Take 1 tablet (20 mEq total) by mouth daily as needed (whenever she is given Lasix). 07/22/14   Tanya Haff, MD  Water For Irrigation, Sterile (FREE WATER) SOLN Place 150 mLs into feeding tube every 6 (six) hours. 05/20/14   Tanya Poisson, MD   Allergies  Allergen Reactions  . Codeine Shortness Of Breath    Other reaction(s): SHORTNESS OF BREATH   . Lipitor [Atorvastatin] Diarrhea and Other (See Comments)    GI upset, rhabdomyalosis   . Aspirin Diarrhea and Other (See Comments)    Only ASA 325mg  ; pt can tolerate low dose asa 81mg  Reaction : GI upset   . Hctz [Hydrochlorothiazide] Rash  . Hydralazine Other (See Comments)    REACTION: unknown  . Reserpine Other (See Comments)    REACTION: unknown  . Tramadol Other (See Comments)     REACTION: unknown    FAMILY HISTORY:  indicated that her mother is deceased. She indicated that her father is deceased.  SOCIAL HISTORY:  reports that she has quit smoking. She has never used smokeless tobacco. She reports that she does not drink alcohol or use illicit drugs.  REVIEW OF SYSTEMS:  Unable to obtain  SUBJECTIVE:  Pt is unable to give a history  VITAL SIGNS: Pulse Rate:  [71-83] 72 (07/24 1615) Resp:  [13-28] 16 (07/24 1615) BP: (116-175)/(64-94) 127/77 mmHg (07/24 1615) SpO2:  [98 %-100 %] 100 % (07/24 1615) FiO2 (%):  [100 %] 100 % (07/24 1600) Weight:  [42.411 kg (93 lb 8 oz)] 42.411 kg (93 lb 8 oz) (07/24 1603) HEMODYNAMICS:   VENTILATOR SETTINGS: Vent Mode:  [-] PRVC FiO2 (%):  [100 %] 100 % Set Rate:  [14 bmp] 14 bmp Vt Set:  [450 mL] 450 mL PEEP:  [5 cmH20]  5 cmH20 INTAKE / OUTPUT: No intake or output data in the 24 hours ending 03/09/15 1648  PHYSICAL EXAMINATION: General:  Ill appearing elderly woman Neuro:  Obtunded, no response to pain or voice HEENT:  ETT in place, OP moist, PERRL Cardiovascular:  Regular no M Lungs:  Clear B  Abdomen:  Soft, benign, PEG Musculoskeletal:  No edema Skin:  No rash  LABS:  CBC  Recent Labs Lab 03/09/15 1405 03/09/15 1406  WBC 7.6  --   HGB 13.3 13.9  HCT 37.6 41.0  PLT 162  --    Coag's  Recent Labs Lab 03/09/15 1450  APTT 30  INR 1.43   BMET  Recent Labs Lab 03/09/15 1406 03/09/15 1450  NA 141 138  K 4.9 4.5  CL 104 103  CO2  --  26  BUN 30* 21*  CREATININE 1.00 0.95  GLUCOSE 119* 115*   Electrolytes  Recent Labs Lab 03/09/15 1450  CALCIUM 9.2   Sepsis Markers No results for input(s): LATICACIDVEN, PROCALCITON, O2SATVEN in the last 168 hours. ABG No results for input(s): PHART, PCO2ART, PO2ART in the last 168 hours. Liver Enzymes  Recent Labs Lab 03/09/15 1450  AST 20  ALT 12*  ALKPHOS 67  BILITOT 0.7  ALBUMIN 3.7   Cardiac Enzymes No results for input(s):  TROPONINI, PROBNP in the last 168 hours. Glucose  Recent Labs Lab 03/09/15 1402  GLUCAP 124*    Imaging Ct Angio Head W/cm &/or Wo Cm  03/09/2015   CLINICAL DATA:  79 year old female with right-sided weakness. History of seizures, memory loss and prior stroke. Subsequent encounter.  EXAM: CT ANGIOGRAPHY HEAD AND NECK  TECHNIQUE: Multidetector CT imaging of the head and neck was performed using the standard protocol during bolus administration of intravenous contrast. Multiplanar CT image reconstructions and MIPs were obtained to evaluate the vascular anatomy. Carotid stenosis measurements (when applicable) are obtained utilizing NASCET criteria, using the distal internal carotid diameter as the denominator.  CONTRAST:  69mL OMNIPAQUE IOHEXOL 350 MG/ML SOLN  COMPARISON:  03/09/2015 head CT.  07/17/2004 brain MR.  FINDINGS: CT HEAD  Brain: Remote left posterior frontal -parietal lobe infarct with encephalomalacia.  No CT evidence of large acute infarct.  No intracranial hemorrhage.  No intracranial enhancing lesion.  No hydrocephalus.  Calvarium and skull base: Negative  Paranasal sinuses: Clear  Orbits: Post lens replacement otherwise negative.  CTA NECK  Aortic arch: 3 vessel arch with atherosclerotic type changes aortic arch and origin of great vessels without high-grade stenosis.  Right carotid system: Ectatic right common carotid artery. Plaque right carotid bifurcation with 72% diameter stenosis proximal right internal carotid artery. Markedly ectatic distal vertical cervical segment right internal carotid artery.  Left carotid system: Ectatic left common carotid artery and left internal carotid artery. Calcified plaque proximal left internal carotid artery with 44% maximal diameter stenosis.  Vertebral arteries:Fold/kink right subclavian artery with moderate narrowing. Right vertebral artery is dominant and ectatic with areas of narrowing without high-grade stenosis. Ectatic left vertebral artery  with areas of mild moderate irregularity and mild narrowing.  Skeleton: Cervical spondylotic changes most notable C5-6 and C6-7.  Other neck: Motion degradation without worrisome neck mass. The ectatic common carotid arteries traversed posteriorly immediately adjacent to the cervical esophagus. No worrisome lung apical lesion.  CTA HEAD  Anterior circulation: Mild narrowing petrous segment internal carotid artery bilaterally. Calcified plaque cavernous segment internal carotid artery bilaterally with mild narrowing on the right and mild slightly moderate narrowing on the left.  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  Mild kink at the middle cerebral artery bifurcation bilaterally with mild narrowing greater on the left. No significant stenosis of the M1 segment of either middle cerebral artery. Decrease number branch vessels left middle cerebral artery consistent with patient's remote infarct.  Posterior circulation: Left vertebral artery ends in a posterior inferior cerebellar artery distribution. No high-grade stenosis of the right vertebral artery or basilar artery. Mild posterior cerebral artery distal branch vessel irregularity bilaterally.  Poor delineation anterior inferior cerebral arteries.  Venous sinuses: Negative  Anatomic variants: Negative  Delayed phase: Negative  IMPRESSION: CT HEAD  Remote left posterior frontal -parietal lobe infarct with encephalomalacia.  No CT evidence of large acute infarct.  CTA NECK  Plaque right carotid bifurcation with 72% diameter stenosis proximal right internal carotid artery.  Calcified plaque proximal left internal carotid artery with 44% maximal diameter stenosis.  Fold/kink right subclavian artery with moderate narrowing.  Right vertebral artery is dominant and ectatic with areas of narrowing without high-grade stenosis.  Ectatic left vertebral artery with areas of mild to moderate irregularity and mild narrowing.  CTA HEAD  Calcified plaque  cavernous segment internal carotid artery bilaterally with mild narrowing on the right and mild slightly moderate narrowing on the left.  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  Mild kink at the middle cerebral artery bifurcation bilaterally with mild narrowing greater on the left. No significant stenosis of the M1 segment of either middle cerebral artery. Decrease number branch vessels left middle cerebral artery consistent with patient's remote infarct.  Left vertebra artery ends in a posterior inferior cerebellar artery distribution.  No high-grade stenosis of the right vertebral artery or basilar artery.  Mild posterior cerebral artery distal branch vessel irregularity bilaterally.  Poor delineation anterior inferior cerebral arteries.  Images reviewed preliminarily at the time of imaging with Dr. Leonel White.  Patient with infiltration (saline) evaluated by Dr. Wyvonnia Dusky of emergency room staff.   Electronically Signed   By: Genia Del M.D.   On: 03/09/2015 14:54   Ct Head Wo Contrast  03/09/2015   CLINICAL DATA:  Increased right side weakness. Aphasia.  EXAM: CT HEAD WITHOUT CONTRAST  TECHNIQUE: Contiguous axial images were obtained from the base of the skull through the vertex without intravenous contrast.  COMPARISON:  Brain MRI 07/17/2014.  Head CT scan 07/16/2014.  FINDINGS: Atrophy and chronic microvascular ischemic change are again identified. Remote infarct in the posterior left MCA territory is again seen. There is no evidence of acute infarction, hemorrhage, mass lesion, mass effect, midline shift or abnormal extra-axial fluid collection. No hydrocephalus or pneumocephalus. The calvarium is intact. Imaged paranasal sinuses and mastoid air cells are clear. Atherosclerosis noted.  IMPRESSION: No acute abnormality.  Atrophy, chronic microvascular ischemic change remote posterior left MCA infarct.  Critical Value/emergent results were called by telephone at the time of  interpretation on 03/09/2015 at 1:54 pm to Dr. Ezequiel Essex , who verbally acknowledged these results.   Electronically Signed   By: Inge Rise M.D.   On: 03/09/2015 13:55   Ct Angio Neck W/cm &/or Wo/cm  03/09/2015   CLINICAL DATA:  79 year old female with right-sided weakness. History of seizures, memory loss and prior stroke. Subsequent encounter.  EXAM: CT ANGIOGRAPHY HEAD AND NECK  TECHNIQUE: Multidetector CT imaging of the head and neck was performed using the standard protocol during bolus administration of intravenous contrast. Multiplanar CT image reconstructions and MIPs were obtained to evaluate the  vascular anatomy. Carotid stenosis measurements (when applicable) are obtained utilizing NASCET criteria, using the distal internal carotid diameter as the denominator.  CONTRAST:  33mL OMNIPAQUE IOHEXOL 350 MG/ML SOLN  COMPARISON:  03/09/2015 head CT.  07/17/2004 brain MR.  FINDINGS: CT HEAD  Brain: Remote left posterior frontal -parietal lobe infarct with encephalomalacia.  No CT evidence of large acute infarct.  No intracranial hemorrhage.  No intracranial enhancing lesion.  No hydrocephalus.  Calvarium and skull base: Negative  Paranasal sinuses: Clear  Orbits: Post lens replacement otherwise negative.  CTA NECK  Aortic arch: 3 vessel arch with atherosclerotic type changes aortic arch and origin of great vessels without high-grade stenosis.  Right carotid system: Ectatic right common carotid artery. Plaque right carotid bifurcation with 72% diameter stenosis proximal right internal carotid artery. Markedly ectatic distal vertical cervical segment right internal carotid artery.  Left carotid system: Ectatic left common carotid artery and left internal carotid artery. Calcified plaque proximal left internal carotid artery with 44% maximal diameter stenosis.  Vertebral arteries:Fold/kink right subclavian artery with moderate narrowing. Right vertebral artery is dominant and ectatic with areas of  narrowing without high-grade stenosis. Ectatic left vertebral artery with areas of mild moderate irregularity and mild narrowing.  Skeleton: Cervical spondylotic changes most notable C5-6 and C6-7.  Other neck: Motion degradation without worrisome neck mass. The ectatic common carotid arteries traversed posteriorly immediately adjacent to the cervical esophagus. No worrisome lung apical lesion.  CTA HEAD  Anterior circulation: Mild narrowing petrous segment internal carotid artery bilaterally. Calcified plaque cavernous segment internal carotid artery bilaterally with mild narrowing on the right and mild slightly moderate narrowing on the left.  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  Mild kink at the middle cerebral artery bifurcation bilaterally with mild narrowing greater on the left. No significant stenosis of the M1 segment of either middle cerebral artery. Decrease number branch vessels left middle cerebral artery consistent with patient's remote infarct.  Posterior circulation: Left vertebral artery ends in a posterior inferior cerebellar artery distribution. No high-grade stenosis of the right vertebral artery or basilar artery. Mild posterior cerebral artery distal branch vessel irregularity bilaterally.  Poor delineation anterior inferior cerebral arteries.  Venous sinuses: Negative  Anatomic variants: Negative  Delayed phase: Negative  IMPRESSION: CT HEAD  Remote left posterior frontal -parietal lobe infarct with encephalomalacia.  No CT evidence of large acute infarct.  CTA NECK  Plaque right carotid bifurcation with 72% diameter stenosis proximal right internal carotid artery.  Calcified plaque proximal left internal carotid artery with 44% maximal diameter stenosis.  Fold/kink right subclavian artery with moderate narrowing.  Right vertebral artery is dominant and ectatic with areas of narrowing without high-grade stenosis.  Ectatic left vertebral artery with areas of mild to  moderate irregularity and mild narrowing.  CTA HEAD  Calcified plaque cavernous segment internal carotid artery bilaterally with mild narrowing on the right and mild slightly moderate narrowing on the left.  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  Mild kink at the middle cerebral artery bifurcation bilaterally with mild narrowing greater on the left. No significant stenosis of the M1 segment of either middle cerebral artery. Decrease number branch vessels left middle cerebral artery consistent with patient's remote infarct.  Left vertebra artery ends in a posterior inferior cerebellar artery distribution.  No high-grade stenosis of the right vertebral artery or basilar artery.  Mild posterior cerebral artery distal branch vessel irregularity bilaterally.  Poor delineation anterior inferior cerebral arteries.  Images  reviewed preliminarily at the time of imaging with Dr. Leonel White.  Patient with infiltration (saline) evaluated by Dr. Wyvonnia Dusky of emergency room staff.   Electronically Signed   By: Genia Del M.D.   On: 03/09/2015 14:54   Dg Chest Portable 1 View  03/09/2015   CLINICAL DATA:  Endotracheal tube placement  EXAM: PORTABLE CHEST - 1 VIEW  COMPARISON:  07/17/2014  FINDINGS: Endotracheal tube with the tip projecting over the right mainstem bronchus. Recommend retracting the endotracheal tube 5.5 cm.  There is no focal parenchymal opacity. There is no pleural effusion or pneumothorax. The heart and mediastinal contours are unremarkable.  The osseous structures are unremarkable.  IMPRESSION: Endotracheal tube with the tip projecting over the right mainstem bronchus. Recommend retracting the endotracheal tube 5.5 cm. These results were called by telephone at the time of interpretation on 03/09/2015 at 4:14 pm to Dr. Ezequiel Essex , who verbally acknowledged these results.   Electronically Signed   By: Kathreen Devoid   On: 03/09/2015 16:19     ASSESSMENT / PLAN:  PULMONARY OETT  7/24 >>  A: VDRF due to altered MS Hx two small RLL PE's 04/2014 Hx L effusion 04/2014 P:   Wean sedation when OK with Neurology (ie if no active seizures on EEG) and assess for SBT, extubation VAP prevention Prn albuterol  Hold Eliquis for now, plan restart once stable   CARDIOVASCULAR A: HTN ? Hx R atrial thrombus; mentioned in d/c summary but not reported on TTE from [facility] 04/2014 or most recent TTE from [facility] 02/2014 P:  Hold lasix and metoprolol while on sedation, assess for restart 7/25 depending on BP  RENAL A:  No acute issues P:   Follow BMP intermittently Hold lasix, potassium and magnesium home meds for now  GASTROINTESTINAL A:  Nutrition GERD P:   Start TF if to remain intubated 7/25 Consider swallow eval post extubation to determine appropriate diet Home pepcid dose Neosporin to PEG site  HEMATOLOGIC A:  Fe deficiency anemia P:  Restart FeSulfate when able to take enteral meds  INFECTIOUS A:  No evidence active infection P:    No abx at this time Follow clinically, fever curve, WBC  ENDOCRINE A:    No acute issues P:   Follow glucose on VBMP  NEUROLOGIC A:  Status epilepticus, complex partial seizures, likely due to missed medications 7/23. No evidence for any other clear triggers, infxn etc Hx CVA with baseline debilitation including aphasia P:   RASS goal: 0 to -1 Anti-epileptics as ordered by Dr Tanya White EEG to be done now. If no further seizures seen then consider lightening propofol tonight and assessment for SBT Hold neurontin  FAMILY  - Updates: no family at bedside  - Inter-disciplinary family meet or Palliative Care meeting due by:      TODAY'S SUMMARY:  Status epilepticus, treated with ativan and dilantin. Became obtunded requiring airway protection. Goal WUA and SBT 7/25 if seizures have stopped.    Independent CC time 45 minutes   Baltazar Apo, MD, PhD 03/09/2015, 5:33 PM [facility] [phone] or if no  answer [phone]

## 2015-03-10 ENCOUNTER — Inpatient Hospital Stay (HOSPITAL_COMMUNITY): Payer: Medicare Other

## 2015-03-10 DIAGNOSIS — R569 Unspecified convulsions: Secondary | ICD-10-CM

## 2015-03-10 DIAGNOSIS — J9600 Acute respiratory failure, unspecified whether with hypoxia or hypercapnia: Secondary | ICD-10-CM | POA: Insufficient documentation

## 2015-03-10 LAB — URINALYSIS W MICROSCOPIC (NOT AT ARMC)
BILIRUBIN URINE: NEGATIVE
Glucose, UA: NEGATIVE mg/dL
Ketones, ur: NEGATIVE mg/dL
Nitrite: NEGATIVE
PROTEIN: 30 mg/dL — AB
Specific Gravity, Urine: 1.005 (ref 1.005–1.030)
Urobilinogen, UA: 0.2 mg/dL (ref 0.0–1.0)
pH: 7 (ref 5.0–8.0)

## 2015-03-10 LAB — CBC
HEMATOCRIT: 33.9 % — AB (ref 36.0–46.0)
HEMOGLOBIN: 12.1 g/dL (ref 12.0–15.0)
MCH: 29.3 pg (ref 26.0–34.0)
MCHC: 35.7 g/dL (ref 30.0–36.0)
MCV: 82.1 fL (ref 78.0–100.0)
Platelets: 152 10*3/uL (ref 150–400)
RBC: 4.13 MIL/uL (ref 3.87–5.11)
RDW: 14.8 % (ref 11.5–15.5)
WBC: 9.2 10*3/uL (ref 4.0–10.5)

## 2015-03-10 LAB — BASIC METABOLIC PANEL
ANION GAP: 9 (ref 5–15)
BUN: 17 mg/dL (ref 6–20)
CALCIUM: 8.5 mg/dL — AB (ref 8.9–10.3)
CHLORIDE: 101 mmol/L (ref 101–111)
CO2: 25 mmol/L (ref 22–32)
Creatinine, Ser: 0.93 mg/dL (ref 0.44–1.00)
GFR calc Af Amer: >60 mL/min (ref >60)
GFR calc non Af Amer: 58 mL/min — ABNORMAL LOW (ref >60)
Glucose, Bld: 92 mg/dL (ref 65–99)
Potassium: 5.1 mmol/L (ref 3.5–5.1)
Sodium: 135 mmol/L (ref 135–145)

## 2015-03-10 LAB — PHOSPHORUS: Phosphorus: 3.9 mg/dL (ref 2.5–4.6)

## 2015-03-10 LAB — MAGNESIUM: Magnesium: 1.9 mg/dL (ref 1.7–2.4)

## 2015-03-10 MED ORDER — LORAZEPAM 2 MG/ML IJ SOLN
0.5000 mg | INTRAMUSCULAR | Status: DC | PRN
  Administered 2015-03-10 – 2015-03-11 (×2): 1 mg via INTRAVENOUS
  Filled 2015-03-10: qty 1

## 2015-03-10 MED ORDER — CEFTRIAXONE SODIUM 1 G IJ SOLR: 1.0000 g | INTRAMUSCULAR | Status: DC

## 2015-03-10 MED ORDER — LORAZEPAM 2 MG/ML IJ SOLN
INTRAMUSCULAR | Status: AC
  Filled 2015-03-10: qty 1

## 2015-03-10 MED ORDER — CHLORHEXIDINE GLUCONATE CLOTH 2 % EX PADS: 6.0000 | MEDICATED_PAD | Freq: Every day | CUTANEOUS | Status: DC

## 2015-03-10 MED ORDER — MUPIROCIN 2 % EX OINT
1.0000 "application " | TOPICAL_OINTMENT | Freq: Two times a day (BID) | CUTANEOUS | Status: DC
  Administered 2015-03-10 – 2015-03-12 (×5): 1 via NASAL
  Filled 2015-03-10: qty 22

## 2015-03-10 MED ORDER — METOPROLOL TARTRATE 25 MG/10 ML ORAL SUSPENSION
12.5000 mg | Freq: Two times a day (BID) | ORAL | Status: DC
  Filled 2015-03-10 (×2): qty 5

## 2015-03-10 MED ORDER — CEFTRIAXONE SODIUM IN DEXTROSE 20 MG/ML IV SOLN
1.0000 g | INTRAVENOUS | Status: DC
  Administered 2015-03-10 – 2015-03-11 (×2): 1 g via INTRAVENOUS
  Filled 2015-03-10 (×2): qty 50

## 2015-03-10 MED ORDER — CHLORHEXIDINE GLUCONATE CLOTH 2 % EX PADS
6.0000 | MEDICATED_PAD | Freq: Every day | CUTANEOUS | Status: DC
  Administered 2015-03-10 – 2015-03-12 (×3): 6 via TOPICAL

## 2015-03-10 MED ORDER — METOPROLOL TARTRATE 1 MG/ML IV SOLN
5.0000 mg | Freq: Four times a day (QID) | INTRAVENOUS | Status: DC
  Administered 2015-03-11 (×2): 5 mg via INTRAVENOUS
  Filled 2015-03-10 (×6): qty 5

## 2015-03-10 NOTE — Progress Notes (Signed)
Spoke with Dr. Aram Beecham regarding patient's fluctuating agitation and attempts to pull lines, foley, and get out of bed.  According to patient's daughter, she is back to her baseline mental and physical self.  It is thought that there is a baseline dementia exacerbated by UTI and hospital admission.  Told Dr. Aram Beecham of constant occluding of EJ catheter and he agreed that Depacon infusion did not need to be completed. [place], [place] 03/10/2015 5:29 PM

## 2015-03-10 NOTE — Care Management Note (Signed)
Case Management Note  Patient Details  Name: Naiya Corral MRN: [id number] Date of Birth: 01/02/1936  Subjective/Objective:  Pt admitted on 03/09/15 with worsening weakness and aphasia, seizures, and VDRF.  PTA, pt resided at [facility].                    Action/Plan: CSW consulted to facilitate return to SNF when medically stable.  Will follow progress.  Expected Discharge Date:                  Expected Discharge Plan:  Skilled Nursing Facility  In-House Referral:  Clinical Social Work  Discharge planning Services  CM Consult  Post Acute Care Choice:    Choice offered to:     DME Arranged:    DME Agency:     HH Arranged:    [place] Agency:     Status of Service:  In process, will continue to follow  Medicare Important Message Given:    Date Medicare IM Given:    Medicare IM give by:    Date Additional Medicare IM Given:    Additional Medicare Important Message give by:     If discussed at [place] of Stay Meetings, dates discussed:    Additional Comments:  Reinaldo Raddle, RN, BSN  Trauma/Neuro ICU Case Manager [phone]

## 2015-03-10 NOTE — Progress Notes (Signed)
PULMONARY / CRITICAL CARE MEDICINE   Name: Tanya White MRN: [id number] DOB: May 30, 1936    ADMISSION DATE:  03/09/2015 CONSULTATION DATE:  03/09/15  REFERRING MD :  Dr Leonel Ramsay, Neurology  CHIEF COMPLAINT:  VDRF  INITIAL PRESENTATION: 56 w hx CVA's and complex partial seizures. Admitted with R weakness and worsening aphasia, workup consistent with recurrent seizures and status epilepticus. She became obtunded after ativan and dilantin, required intubation. Admitted to ICU  STUDIES:  Head CT 7/24 >> no acute abnormality, atrophy, remote L MCA CVA CT angio Head and neck 7/24 >> no acute infarct, no large vessel occlusion EEG 7/24 > generalized irregular slow activity, no seizure present  SIGNIFICANT EVENTS: Intubated 7/24  SUBJECTIVE: follows commands when off of sedation, remains vented, no witnessed seizure activity   VITAL SIGNS: Temp:  [98 F (36.7 C)-99.9 F (37.7 C)] 98.6 F (37 C) (07/25 0755) Pulse Rate:  [54-86] 71 (07/25 0930) Resp:  [13-32] 16 (07/25 1000) BP: (86-175)/(30-122) 141/122 mmHg (07/25 1000) SpO2:  [98 %-100 %] 100 % (07/25 0930) FiO2 (%):  [30 %-100 %] 30 % (07/25 0818) Weight:  [42.411 kg (93 lb 8 oz)-43.4 kg (95 lb 10.9 oz)] 43.4 kg (95 lb 10.9 oz) (07/25 0425) HEMODYNAMICS:   VENTILATOR SETTINGS: Vent Mode:  [-] PRVC FiO2 (%):  [30 %-100 %] 30 % Set Rate:  [14 bmp-16 bmp] 16 bmp Vt Set:  [310 mL-450 mL] 310 mL PEEP:  [5 cmH20] 5 cmH20 Plateau Pressure:  [12 cmH20-14 cmH20] 14 cmH20 INTAKE / OUTPUT:  Intake/Output Summary (Last 24 hours) at 03/10/15 1044 Last data filed at 03/10/15 0900  Gross per 24 hour  Intake 1293.6 ml  Output    775 ml  Net  518.6 ml    PHYSICAL EXAMINATION:  Gen: on vent HENT: NCAT ETT in place PULM: CTA B, normal percussion CV: RRR, no mgr, trace edema GI: BS+, soft, nontender Derm: no cyanosis or rash Neuro: sedated on vent but wakes to touch, pushes me away   LABS:  CBC  Recent Labs Lab  03/09/15 1405 03/09/15 1406 03/10/15 0210  WBC 7.6  --  9.2  HGB 13.3 13.9 12.1  HCT 37.6 41.0 33.9*  PLT 162  --  152   Coag's  Recent Labs Lab 03/09/15 1450  APTT 30  INR 1.43   BMET  Recent Labs Lab 03/09/15 1406 03/09/15 1450 03/10/15 0210  NA 141 138 135  K 4.9 4.5 5.1  CL 104 103 101  CO2  --  26 25  BUN 30* 21* 17  CREATININE 1.00 0.95 0.93  GLUCOSE 119* 115* 92   Electrolytes  Recent Labs Lab 03/09/15 1450 03/10/15 0210  CALCIUM 9.2 8.5*  MG  --  1.9  PHOS  --  3.9   Sepsis Markers No results for input(s): LATICACIDVEN, PROCALCITON, O2SATVEN in the last 168 hours. ABG  Recent Labs Lab 03/09/15 2111  PHART 7.443  PCO2ART 40.1  PO2ART 185.0*   Liver Enzymes  Recent Labs Lab 03/09/15 1450  AST 20  ALT 12*  ALKPHOS 67  BILITOT 0.7  ALBUMIN 3.7   Cardiac Enzymes No results for input(s): TROPONINI, PROBNP in the last 168 hours. Glucose  Recent Labs Lab 03/09/15 1402 03/09/15 2010  GLUCAP 124* 97    Imaging  7/24 CXR > normal lung fields, ETT in place  ASSESSMENT / PLAN:  PULMONARY OETT 7/24 >>  A: VDRF due to altered MS> no respiratory mechanic or oxygenation issues Hx two small  RLL PE's 04/2014 Hx L effusion 04/2014 P:   Would like to extubate today, will check with Neurology re timing/more meds? MRI? VAP prevention Prn albuterol  Hold Eliquis for now, plan restart once stable   CARDIOVASCULAR A: HTN ? Hx R atrial thrombus; mentioned in d/c summary but not reported on TTE from [facility] 04/2014 or most recent [facility]TE from [facility] 02/2014 P:  Hold lasix and metoprolol while on sedation,  Restart metoprolol today Tele monitoring  RENAL A:  No acute issues P:   Monitor BMET and UOP Replace electrolytes as needed  GASTROINTESTINAL A:  Nutrition GERD P:   Swallow eval post extubation to determine appropriate diet Home pepcid dose Neosporin to PEG site  HEMATOLOGIC A:  Fe deficiency anemia P:  Restart FeSulfate when able  to take enteral meds  INFECTIOUS A:  No evidence active infection P:    Monitor for fever  ENDOCRINE A:    No acute issues P:   Follow glucose on VBMP  NEUROLOGIC A:  Status epilepticus, complex partial seizures, likely due to missed medications 7/23. No evidence for any other clear triggers, infxn etc Hx CVA with baseline debilitation including aphasia P:   RASS goal: 0 to -1 Anti-epileptics as ordered by neurology Will check with neuro> do we need MRI?   FAMILY  - Updates: no family at bedside  - Inter-disciplinary family meet or Palliative Care meeting due by:  Day 7    TODAY'S SUMMARY:  Status epilepticus, treated with ativan and dilantin. Became obtunded requiring airway protection. Goal WUA and SBT today if OK by neurology, have discussed with them.   Independent CC time 40 minutes   Roselie Awkward, MD [place] [facility] Pager: [phone] Cell: [phone] After 3pm or if no response, call [phone]

## 2015-03-10 NOTE — Progress Notes (Signed)
Subjective: patient intubae and sedated on propofol.  No clinical seizures noted. Per nurse, when propofol turned off she is very combative  And agitated but follows some commands.   Objective: Current vital signs: BP 127/69 mmHg  Pulse 71  Temp(Src) 98.6 F (37 C) (Oral)  Resp 16  Ht 4\' 9"  (1.448 m)  Wt 95 lb 10.9 oz (43.4 kg)  BMI 20.70 kg/m2  SpO2 100% Vital signs in last 24 hours: Temp:  [98 F (36.7 C)-99.9 F (37.7 C)] 98.6 F (37 C) (07/25 0755) Pulse Rate:  [54-86] 71 (07/25 0930) Resp:  [13-32] 16 (07/25 0930) BP: (86-175)/(30-109) 127/69 mmHg (07/25 0930) SpO2:  [98 %-100 %] 100 % (07/25 0930) FiO2 (%):  [30 %-100 %] 30 % (07/25 0818) Weight:  [93 lb 8 oz (42.411 kg)-95 lb 10.9 oz (43.4 kg)] 95 lb 10.9 oz (43.4 kg) (07/25 0425)  Intake/Output from previous day: 07/24 0701 - 07/25 0700 In: 1132.6 [I.V.:427.6; NG/GT:380; IV Piggyback:325] Out: 550 [Urine:550] Intake/Output this shift: Total I/O In: 161 [I.V.:51; IV Piggyback:110] Out: 225 [Urine:225] Nutritional status: Diet NPO time specified  Neurologic Exam: General: Mental Status: Intubated and sedated,  Does not respond to verbal or tactile stimuli Cranial Nerves: II: pupils equal, round, reactive to light and accommodation III,IV, VI: ptosis not present, doll's reflexintact V,VII: face symmetric,corneal reflex intact   Motor: Flaccid extremities throughout Sensory: no response to pain bilaterally Deep Tendon Reflexes:  Right: Upper Extremity   Left: Upper extremity   biceps (C-5 to C-6) 2/4   biceps (C-5 to C-6) 2/4 tricep (C7) 2/4    triceps (C7) 2/4 Brachioradialis (C6) 2/4  Brachioradialis (C6) 2/4  Lower Extremity Lower Extremity  quadriceps (L-2 to L-4) 2/4   quadriceps (L-2 to L-4) 2/4 Achilles (S1) 1/4   Achilles (S1) 1/4  Plantars: Right: downgoing   Left: downgoing   Lab Results: Basic Metabolic Panel:  Recent Labs Lab 03/09/15 1406 03/09/15 1450 03/10/15 0210  NA 141 138  135  K 4.9 4.5 5.1  CL 104 103 101  CO2  --  26 25  GLUCOSE 119* 115* 92  BUN 30* 21* 17  CREATININE 1.00 0.95 0.93  CALCIUM  --  9.2 8.5*  MG  --   --  1.9  PHOS  --   --  3.9    Liver Function Tests:  Recent Labs Lab 03/09/15 1450  AST 20  ALT 12*  ALKPHOS 67  BILITOT 0.7  PROT 7.1  ALBUMIN 3.7   No results for input(s): LIPASE, AMYLASE in the last 168 hours. No results for input(s): AMMONIA in the last 168 hours.  CBC:  Recent Labs Lab 03/09/15 1405 03/09/15 1406 03/10/15 0210  WBC 7.6  --  9.2  NEUTROABS 6.3  --   --   HGB 13.3 13.9 12.1  HCT 37.6 41.0 33.9*  MCV 82.8  --  82.1  PLT 162  --  152    Cardiac Enzymes: No results for input(s): CKTOTAL, CKMB, CKMBINDEX, TROPONINI in the last 168 hours.  Lipid Panel: No results for input(s): CHOL, TRIG, HDL, CHOLHDL, VLDL, LDLCALC in the last 168 hours.  CBG:  Recent Labs Lab 03/09/15 1402 03/09/15 2010  GLUCAP 124* 97    Microbiology: Results for orders placed or performed during the hospital encounter of 03/09/15  MRSA PCR Screening     Status: Abnormal   Collection Time: 03/09/15  6:56 PM  Result Value Ref Range Status   MRSA by PCR POSITIVE (  A) NEGATIVE Final    Comment:        The GeneXpert MRSA Assay (FDA approved for NASAL specimens only), is one component of a comprehensive MRSA colonization surveillance program. It is not intended to diagnose MRSA infection nor to guide or monitor treatment for MRSA infections. RESULT CALLED TO, READ BACK BY AND VERIFIED WITH: RN Tawni Carnes [phone] @2117  THANEY     Coagulation Studies:  Recent Labs  03/09/15 1450  LABPROT 17.6*  INR 1.43    Imaging: Ct Angio Head W/cm &/or Wo Cm  03/09/2015   CLINICAL DATA:  79 year old female with right-sided weakness. History of seizures, memory loss and prior stroke. Subsequent encounter.  EXAM: CT ANGIOGRAPHY HEAD AND NECK  TECHNIQUE: Multidetector CT imaging of the head and neck was performed using  the standard protocol during bolus administration of intravenous contrast. Multiplanar CT image reconstructions and MIPs were obtained to evaluate the vascular anatomy. Carotid stenosis measurements (when applicable) are obtained utilizing NASCET criteria, using the distal internal carotid diameter as the denominator.  CONTRAST:  56mL OMNIPAQUE IOHEXOL 350 MG/ML SOLN  COMPARISON:  03/09/2015 head CT.  07/17/2004 brain MR.  FINDINGS: CT HEAD  Brain: Remote left posterior frontal -parietal lobe infarct with encephalomalacia.  No CT evidence of large acute infarct.  No intracranial hemorrhage.  No intracranial enhancing lesion.  No hydrocephalus.  Calvarium and skull base: Negative  Paranasal sinuses: Clear  Orbits: Post lens replacement otherwise negative.  CTA NECK  Aortic arch: 3 vessel arch with atherosclerotic type changes aortic arch and origin of great vessels without high-grade stenosis.  Right carotid system: Ectatic right common carotid artery. Plaque right carotid bifurcation with 72% diameter stenosis proximal right internal carotid artery. Markedly ectatic distal vertical cervical segment right internal carotid artery.  Left carotid system: Ectatic left common carotid artery and left internal carotid artery. Calcified plaque proximal left internal carotid artery with 44% maximal diameter stenosis.  Vertebral arteries:Fold/kink right subclavian artery with moderate narrowing. Right vertebral artery is dominant and ectatic with areas of narrowing without high-grade stenosis. Ectatic left vertebral artery with areas of mild moderate irregularity and mild narrowing.  Skeleton: Cervical spondylotic changes most notable C5-6 and C6-7.  Other neck: Motion degradation without worrisome neck mass. The ectatic common carotid arteries traversed posteriorly immediately adjacent to the cervical esophagus. No worrisome lung apical lesion.  CTA HEAD  Anterior circulation: Mild narrowing petrous segment internal carotid  artery bilaterally. Calcified plaque cavernous segment internal carotid artery bilaterally with mild narrowing on the right and mild slightly moderate narrowing on the left.  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  Mild kink at the middle cerebral artery bifurcation bilaterally with mild narrowing greater on the left. No significant stenosis of the M1 segment of either middle cerebral artery. Decrease number branch vessels left middle cerebral artery consistent with patient's remote infarct.  Posterior circulation: Left vertebral artery ends in a posterior inferior cerebellar artery distribution. No high-grade stenosis of the right vertebral artery or basilar artery. Mild posterior cerebral artery distal branch vessel irregularity bilaterally.  Poor delineation anterior inferior cerebral arteries.  Venous sinuses: Negative  Anatomic variants: Negative  Delayed phase: Negative  IMPRESSION: CT HEAD  Remote left posterior frontal -parietal lobe infarct with encephalomalacia.  No CT evidence of large acute infarct.  CTA NECK  Plaque right carotid bifurcation with 72% diameter stenosis proximal right internal carotid artery.  Calcified plaque proximal left internal carotid artery with 44% maximal diameter stenosis.  Fold/kink  right subclavian artery with moderate narrowing.  Right vertebral artery is dominant and ectatic with areas of narrowing without high-grade stenosis.  Ectatic left vertebral artery with areas of mild to moderate irregularity and mild narrowing.  CTA HEAD  Calcified plaque cavernous segment internal carotid artery bilaterally with mild narrowing on the right and mild slightly moderate narrowing on the left.  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  Mild kink at the middle cerebral artery bifurcation bilaterally with mild narrowing greater on the left. No significant stenosis of the M1 segment of either middle cerebral artery. Decrease number branch vessels left  middle cerebral artery consistent with patient's remote infarct.  Left vertebra artery ends in a posterior inferior cerebellar artery distribution.  No high-grade stenosis of the right vertebral artery or basilar artery.  Mild posterior cerebral artery distal branch vessel irregularity bilaterally.  Poor delineation anterior inferior cerebral arteries.  Images reviewed preliminarily at the time of imaging with Dr. Leonel Ramsay.  Patient with infiltration (saline) evaluated by Dr. Wyvonnia Dusky of emergency room staff.   Electronically Signed   By: Genia Del M.D.   On: 03/09/2015 14:54   Ct Head Wo Contrast  03/09/2015   CLINICAL DATA:  Increased right side weakness. Aphasia.  EXAM: CT HEAD WITHOUT CONTRAST  TECHNIQUE: Contiguous axial images were obtained from the base of the skull through the vertex without intravenous contrast.  COMPARISON:  Brain MRI 07/17/2014.  Head CT scan 07/16/2014.  FINDINGS: Atrophy and chronic microvascular ischemic change are again identified. Remote infarct in the posterior left MCA territory is again seen. There is no evidence of acute infarction, hemorrhage, mass lesion, mass effect, midline shift or abnormal extra-axial fluid collection. No hydrocephalus or pneumocephalus. The calvarium is intact. Imaged paranasal sinuses and mastoid air cells are clear. Atherosclerosis noted.  IMPRESSION: No acute abnormality.  Atrophy, chronic microvascular ischemic change remote posterior left MCA infarct.  Critical Value/emergent results were called by telephone at the time of interpretation on 03/09/2015 at 1:54 pm to Dr. Ezequiel Essex , who verbally acknowledged these results.   Electronically Signed   By: Inge Rise M.D.   On: 03/09/2015 13:55   Ct Angio Neck W/cm &/or Wo/cm  03/09/2015   CLINICAL DATA:  79 year old female with right-sided weakness. History of seizures, memory loss and prior stroke. Subsequent encounter.  EXAM: CT ANGIOGRAPHY HEAD AND NECK  TECHNIQUE: Multidetector  CT imaging of the head and neck was performed using the standard protocol during bolus administration of intravenous contrast. Multiplanar CT image reconstructions and MIPs were obtained to evaluate the vascular anatomy. Carotid stenosis measurements (when applicable) are obtained utilizing NASCET criteria, using the distal internal carotid diameter as the denominator.  CONTRAST:  67mL OMNIPAQUE IOHEXOL 350 MG/ML SOLN  COMPARISON:  03/09/2015 head CT.  07/17/2004 brain MR.  FINDINGS: CT HEAD  Brain: Remote left posterior frontal -parietal lobe infarct with encephalomalacia.  No CT evidence of large acute infarct.  No intracranial hemorrhage.  No intracranial enhancing lesion.  No hydrocephalus.  Calvarium and skull base: Negative  Paranasal sinuses: Clear  Orbits: Post lens replacement otherwise negative.  CTA NECK  Aortic arch: 3 vessel arch with atherosclerotic type changes aortic arch and origin of great vessels without high-grade stenosis.  Right carotid system: Ectatic right common carotid artery. Plaque right carotid bifurcation with 72% diameter stenosis proximal right internal carotid artery. Markedly ectatic distal vertical cervical segment right internal carotid artery.  Left carotid system: Ectatic left common carotid artery and left  internal carotid artery. Calcified plaque proximal left internal carotid artery with 44% maximal diameter stenosis.  Vertebral arteries:Fold/kink right subclavian artery with moderate narrowing. Right vertebral artery is dominant and ectatic with areas of narrowing without high-grade stenosis. Ectatic left vertebral artery with areas of mild moderate irregularity and mild narrowing.  Skeleton: Cervical spondylotic changes most notable C5-6 and C6-7.  Other neck: Motion degradation without worrisome neck mass. The ectatic common carotid arteries traversed posteriorly immediately adjacent to the cervical esophagus. No worrisome lung apical lesion.  CTA HEAD  Anterior  circulation: Mild narrowing petrous segment internal carotid artery bilaterally. Calcified plaque cavernous segment internal carotid artery bilaterally with mild narrowing on the right and mild slightly moderate narrowing on the left.  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  Mild kink at the middle cerebral artery bifurcation bilaterally with mild narrowing greater on the left. No significant stenosis of the M1 segment of either middle cerebral artery. Decrease number branch vessels left middle cerebral artery consistent with patient's remote infarct.  Posterior circulation: Left vertebral artery ends in a posterior inferior cerebellar artery distribution. No high-grade stenosis of the right vertebral artery or basilar artery. Mild posterior cerebral artery distal branch vessel irregularity bilaterally.  Poor delineation anterior inferior cerebral arteries.  Venous sinuses: Negative  Anatomic variants: Negative  Delayed phase: Negative  IMPRESSION: CT HEAD  Remote left posterior frontal -parietal lobe infarct with encephalomalacia.  No CT evidence of large acute infarct.  CTA NECK  Plaque right carotid bifurcation with 72% diameter stenosis proximal right internal carotid artery.  Calcified plaque proximal left internal carotid artery with 44% maximal diameter stenosis.  Fold/kink right subclavian artery with moderate narrowing.  Right vertebral artery is dominant and ectatic with areas of narrowing without high-grade stenosis.  Ectatic left vertebral artery with areas of mild to moderate irregularity and mild narrowing.  CTA HEAD  Calcified plaque cavernous segment internal carotid artery bilaterally with mild narrowing on the right and mild slightly moderate narrowing on the left.  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  Mild kink at the middle cerebral artery bifurcation bilaterally with mild narrowing greater on the left. No significant stenosis of the M1 segment of either  middle cerebral artery. Decrease number branch vessels left middle cerebral artery consistent with patient's remote infarct.  Left vertebra artery ends in a posterior inferior cerebellar artery distribution.  No high-grade stenosis of the right vertebral artery or basilar artery.  Mild posterior cerebral artery distal branch vessel irregularity bilaterally.  Poor delineation anterior inferior cerebral arteries.  Images reviewed preliminarily at the time of imaging with Dr. Leonel Ramsay.  Patient with infiltration (saline) evaluated by Dr. Wyvonnia Dusky of emergency room staff.   Electronically Signed   By: Genia Del M.D.   On: 03/09/2015 14:54   Dg Chest Port 1 View  03/10/2015   CLINICAL DATA:  Respiratory failure requiring intubation  EXAM: PORTABLE CHEST - 1 VIEW  COMPARISON:  03/09/2015  FINDINGS: Endotracheal tube remains in good position. NG tubes in place with the tip in the stomach. The tip is not visualized.  Lungs are hyperinflated and clear. No infiltrate or effusion. Negative for heart failure.  IMPRESSION: Endotracheal tube in good position.  NG tube now on the stomach.  Lungs remain clear.   Electronically Signed   By: Franchot Gallo M.D.   On: 03/10/2015 07:45   Dg Chest Portable 1 View  03/09/2015   CLINICAL DATA:  Hypoxia  EXAM: PORTABLE CHEST -  1 VIEW  COMPARISON:  Study obtained earlier in the day  FINDINGS: Endotracheal tube tip is 5 mm above the carina currently. No pneumothorax. There is no edema or consolidation. Heart size and pulmonary vascular normal. No adenopathy. No bone lesions. There is atherosclerotic change in aorta.  IMPRESSION: Endotracheal tube tip is 5 mm above carina. Advise withdrawing endotracheal tube approximately 3 cm. No pneumothorax. No lung edema or consolidation.   Electronically Signed   By: Lowella Grip III M.D.   On: 03/09/2015 17:03   Dg Chest Portable 1 View  03/09/2015   CLINICAL DATA:  Endotracheal tube placement  EXAM: PORTABLE CHEST - 1 VIEW   COMPARISON:  07/17/2014  FINDINGS: Endotracheal tube with the tip projecting over the right mainstem bronchus. Recommend retracting the endotracheal tube 5.5 cm.  There is no focal parenchymal opacity. There is no pleural effusion or pneumothorax. The heart and mediastinal contours are unremarkable.  The osseous structures are unremarkable.  IMPRESSION: Endotracheal tube with the tip projecting over the right mainstem bronchus. Recommend retracting the endotracheal tube 5.5 cm. These results were called by telephone at the time of interpretation on 03/09/2015 at 4:14 pm to Dr. Ezequiel Essex , who verbally acknowledged these results.   Electronically Signed   By: Kathreen Devoid   On: 03/09/2015 16:19   Dg Abd Portable 1v  03/09/2015   CLINICAL DATA:  Orogastric tube placement  EXAM: PORTABLE ABDOMEN - 1 VIEW  COMPARISON:  Chest radiograph obtained earlier in the day  FINDINGS: Orogastric tube tip and side port in stomach. Visualized bowel gas pattern normal.  Endotracheal tube tip is 1 cm above the carina. Visualized lungs are clear. No pneumothorax evident. Heart size and pulmonary vascularity are normal.  IMPRESSION: Nasogastric tube and endotracheal tube as described. Bowel gas pattern normal. Visualized lungs clear.   Electronically Signed   By: Lowella Grip III M.D.   On: 03/09/2015 21:39    Medications:  Scheduled: . antiseptic oral rinse  7 mL Mouth Rinse QID  . chlorhexidine  15 mL Mouth Rinse BID  . Chlorhexidine Gluconate Cloth  6 each Topical Q0600  . famotidine  20 mg Oral BID  . free water  150 mL Per Tube 4 times per day  . gabapentin  100 mg Oral BID  . heparin  5,000 Units Subcutaneous 3 times per day  . lacosamide (VIMPAT) IV  200 mg Intravenous Q12H  . levETIRAcetam  1,000 mg Intravenous Q12H  . multivitamin  5 mL Oral Daily  . mupirocin ointment  1 application Nasal BID  . neomycin-bacitracin-polymyxin  1 application Topical BID  . valproate sodium  125 mg Intravenous 3  times per day    Assessment/Plan:  79 year old female presenting with what is likely partial status epilepticus.EEG obtained and showed no further seizures. Currently intubated and sedated with no clinical seizures.   Recommend:  1) gabapentin 100 mg twice a day  2) Depakote 125 mg twice a day 3) vimpat 200mg  BID 4) Keppra 1G BID.      Etta Quill PA-C [facility] [phone]  03/10/2015, 10:03 AM

## 2015-03-10 NOTE — Progress Notes (Signed)
eLink Physician-Brief Progress Note Patient Name: Tanya White DOB: 14-Jun-1936 MRN: [id number]   Date of Service  03/10/2015  HPI/Events of Note  Agitated, h/o seizures Ativan on home med  eICU Interventions  Use IV ativan prn -low dose Consider precedex if more required Change lopressor to IV     Intervention Category Major Interventions: Delirium, psychosis, severe agitation - evaluation and management  ALVA,RAKESH V. 03/10/2015, 8:56 PM

## 2015-03-10 NOTE — Procedures (Signed)
Extubation Procedure Note  Patient Details:   Name: Tanya White DOB: 13-Sep-1935 MRN: [id number]   Airway Documentation:     Evaluation  O2 sats: stable throughout Complications: No apparent complications Patient did tolerate procedure well. Bilateral Breath Sounds: Diminished, Clear Suctioning: Airway Yes   Patient tolerated extubation well, but was VERY aggressive after extubation. Able to vocalize and clear secretions. Will continue to monitor.  Saunders Glance 03/10/2015, 1:35 PM

## 2015-03-10 NOTE — Progress Notes (Signed)
[facility]  U/A reviewed> has UTI, likely explains this picture Start ceftriaxone, send urine culture  Roselie Awkward, MD [place] [facility] Pager: [phone] Cell: [phone] After 3pm or if no response, call [phone]

## 2015-03-11 ENCOUNTER — Inpatient Hospital Stay (HOSPITAL_COMMUNITY): Payer: Medicare Other

## 2015-03-11 DIAGNOSIS — Z0189 Encounter for other specified special examinations: Secondary | ICD-10-CM | POA: Insufficient documentation

## 2015-03-11 DIAGNOSIS — J9600 Acute respiratory failure, unspecified whether with hypoxia or hypercapnia: Secondary | ICD-10-CM

## 2015-03-11 LAB — LEVETIRACETAM LEVEL: LEVETIRACETAM: 37.4 ug/mL (ref 10.0–40.0)

## 2015-03-11 LAB — BASIC METABOLIC PANEL
ANION GAP: 8 (ref 5–15)
BUN: 12 mg/dL (ref 6–20)
CALCIUM: 8.2 mg/dL — AB (ref 8.9–10.3)
CHLORIDE: 105 mmol/L (ref 101–111)
CO2: 25 mmol/L (ref 22–32)
CREATININE: 0.91 mg/dL (ref 0.44–1.00)
GFR calc non Af Amer: 59 mL/min — ABNORMAL LOW (ref >60)
GLUCOSE: 91 mg/dL (ref 65–99)
Potassium: 3.5 mmol/L (ref 3.5–5.1)
SODIUM: 138 mmol/L (ref 135–145)

## 2015-03-11 MED ORDER — LEVETIRACETAM 100 MG/ML PO SOLN
1000.0000 mg | Freq: Two times a day (BID) | ORAL | Status: DC
  Filled 2015-03-11: qty 10

## 2015-03-11 MED ORDER — VALPROIC ACID 250 MG/5ML PO SYRP
125.0000 mg | ORAL_SOLUTION | Freq: Two times a day (BID) | ORAL | Status: DC
  Filled 2015-03-11: qty 2.5

## 2015-03-11 MED ORDER — METOPROLOL TARTRATE 1 MG/ML IV SOLN
5.0000 mg | Freq: Four times a day (QID) | INTRAVENOUS | Status: DC
  Administered 2015-03-11 – 2015-03-12 (×3): 5 mg via INTRAVENOUS
  Filled 2015-03-11: qty 5

## 2015-03-11 MED ORDER — VALPROATE SODIUM 500 MG/5ML IV SOLN
125.0000 mg | Freq: Two times a day (BID) | INTRAVENOUS | Status: DC
  Administered 2015-03-11 – 2015-03-12 (×2): 125 mg via INTRAVENOUS
  Filled 2015-03-11 (×3): qty 1.25

## 2015-03-11 MED ORDER — VALPROATE SODIUM 500 MG/5ML IV SOLN
125.0000 mg | Freq: Two times a day (BID) | INTRAVENOUS | Status: DC
  Filled 2015-03-11: qty 1.25

## 2015-03-11 MED ORDER — CEFTRIAXONE SODIUM IN DEXTROSE 20 MG/ML IV SOLN
1.0000 g | INTRAVENOUS | Status: DC
  Administered 2015-03-12: 1 g via INTRAVENOUS
  Filled 2015-03-11: qty 50

## 2015-03-11 MED ORDER — LEVOFLOXACIN 750 MG PO TABS: 750.0000 mg | ORAL_TABLET | ORAL | Status: DC

## 2015-03-11 MED ORDER — LACOSAMIDE 10 MG/ML PO SOLN: 200.0000 mg | Freq: Two times a day (BID) | ORAL | Status: DC

## 2015-03-11 MED ORDER — APIXABAN 5 MG PO TABS
5.0000 mg | ORAL_TABLET | Freq: Two times a day (BID) | ORAL | Status: DC
  Administered 2015-03-11 – 2015-03-12 (×2): 5 mg via ORAL
  Filled 2015-03-11 (×6): qty 1

## 2015-03-11 MED ORDER — SODIUM CHLORIDE 0.9 % IV SOLN
200.0000 mg | Freq: Two times a day (BID) | INTRAVENOUS | Status: DC
  Administered 2015-03-11: 200 mg via INTRAVENOUS
  Filled 2015-03-11 (×3): qty 20

## 2015-03-11 MED ORDER — LEVETIRACETAM 500 MG PO TABS: 1000.0000 mg | ORAL_TABLET | Freq: Two times a day (BID) | ORAL | Status: DC

## 2015-03-11 MED ORDER — SODIUM CHLORIDE 0.9 % IV SOLN
1000.0000 mg | Freq: Two times a day (BID) | INTRAVENOUS | Status: DC
  Administered 2015-03-11 – 2015-03-12 (×2): 1000 mg via INTRAVENOUS
  Filled 2015-03-11 (×4): qty 10

## 2015-03-11 MED ORDER — METOPROLOL TARTRATE 25 MG/10 ML ORAL SUSPENSION
12.5000 mg | Freq: Two times a day (BID) | ORAL | Status: DC
  Filled 2015-03-11: qty 5

## 2015-03-11 NOTE — Progress Notes (Signed)
eLink Physician-Brief Progress Note Patient Name: Maylea Soria DOB: 15-Apr-1936 MRN: [id number]   Date of Service  03/11/2015  HPI/Events of Note  Patient pulled out IV access. Bedside nurse nor IV team able to replace.   eICU Interventions  Will order PICC line.      Intervention Category Minor Interventions: Routine modifications to care plan (e.g. PRN medications for pain, fever)  Okie Jansson Eugene 03/11/2015, 3:25 PM

## 2015-03-11 NOTE — Significant Event (Signed)
Patient transferred to 4N-10.  Report called to [facility] receiving RN.  All patients belongings gathered and transfered with patient.  Daughter notifed of transfer.  Patient remains on monitor for transfer Vital signs stable.

## 2015-03-11 NOTE — Evaluation (Signed)
Clinical/Bedside Swallow Evaluation Patient Details  Name: Tanya White MRN: [id number] Date of Birth: [birth year]  Today's Date: 03/11/2015 Time: SLP Start Time (ACUTE ONLY): 1121 SLP Stop Time (ACUTE ONLY): 1142 SLP Time Calculation (min) (ACUTE ONLY): 21 min  Past Medical History:  Past Medical History  Diagnosis Date  . Seizures   . Hypotension   . Pneumonia   . Stroke   . GI bleed   . Pulmonary embolism   . Gastrostomy tube in place   . Memory loss   . TTP (thrombotic thrombocytopenic purpura)     Treated with PLEX in 2012 at [facility]   Past Surgical History:  Past Surgical History  Procedure Laterality Date  . Appendectomy    . Jejunostomy feeding tube     HPI:  61 w hx CVA's and complex partial seizures. Admitted with R weakness and worsening aphasia, workup consistent with recurrent seizures and status epilepticus. She became obtunded after ativan and dilantin, required intubation. Pt has previously required PEG despite recommendation for regular textures and thin liquids due to cognitively-based dysphagia impacting ability to adequately get nutrition.   Assessment / Plan / Recommendation Clinical Impression  Pt's cognitive-linguistic status significantly impacts her ability to consume POs at this time, despite Max multimodal cueing from SLP for redirection to task and increased awareness of POs. She did not take any solids by mouth, and expectorated the majority of thin liquids offered. Small amounts of thin liquid that were swallowed were consumed without incidence. Will initiate a thin liquid diet, however highly doubt that pt will be able to take medicines and adequate amounts of nutrition given current mentation.    Aspiration Risk  Moderate    Diet Recommendation Thin   Medication Administration: Via alternative means Compensations: Slow rate;Small sips/bites    Other  Recommendations Oral Care Recommendations: Oral care QID   Follow Up Recommendations    SNF    Frequency and Duration min 2x/week  2 weeks   Pertinent Vitals/Pain n/a    SLP Swallow Goals     Swallow Study Prior Functional Status       General Other Pertinent Information: 26 w hx CVA's and complex partial seizures. Admitted with R weakness and worsening aphasia, workup consistent with recurrent seizures and status epilepticus. She became obtunded after ativan and dilantin, required intubation. Pt has previously required PEG despite recommendation for regular textures and thin liquids due to cognitively-based dysphagia impacting ability to adequately get nutrition. Type of Study: Bedside swallow evaluation Previous Swallow Assessment: see HPI Diet Prior to this Study: NPO Temperature Spikes Noted: Yes (99.3) Respiratory Status: Room air History of Recent Intubation: Yes Length of Intubations (days): 1 days Date extubated: 03/10/15 Behavior/Cognition: Alert;Confused;Fusing/Irritable;Doesn't follow directions Self-Feeding Abilities: Needs assist Patient Positioning: Upright in bed Baseline Vocal Quality: Normal Volitional Cough: Cognitively unable to elicit Volitional Swallow: Unable to elicit    Oral/Motor/Sensory Function Overall Oral Motor/Sensory Function: Other (comment) (does not follow commands to assess)   Ice Chips Ice chips: Impaired Presentation: Spoon Oral Phase Impairments: Other (comment) (no oral acceptance`)   Thin Liquid Thin Liquid: Impaired Presentation: Cup;Spoon;Straw Oral Phase Impairments: Poor awareness of bolus;Other (comment) (very limited intake)    Nectar Thick Nectar Thick Liquid: Not tested   Honey Thick Honey Thick Liquid: Not tested   Puree Puree: Impaired Presentation: Spoon Oral Phase Impairments: Other (comment) (no bolus acceptance)   Solid    Solid: Impaired Oral Phase Impairments: Other (comment) (no oral acceptance)      Mickel Baas  Paiewonsky, M.A. CCC-SLP [phone]  Germain Osgood 03/11/2015,11:50 AM

## 2015-03-11 NOTE — Progress Notes (Signed)
eLink Physician-Brief Progress Note Patient Name: Alashia Brownfield DOB: [birth year] MRN: [id number]   Date of Service  03/11/2015  HPI/Events of Note  Patient without IV access and presently NPO. PICC team unable to place PICC today for some reason.   eICU Interventions  Will order: 1. Place NGT.  2. Portable KUB to check NGT placement.  3. Change Rocephin to Levaquin PO. 4. Change Keppra, Vimpat, Depakote and Metoprolol from IV to PO.     Intervention Category Major Interventions: Seizures - evaluation and management;Infection - evaluation and management  Bairon Klemann Cornelia Copa 03/11/2015, 4:27 PM

## 2015-03-11 NOTE — Evaluation (Signed)
Physical Therapy Evaluation Patient Details Name: Tanya White MRN: [id number] DOB: April 20, 1936 Today's Date: 03/11/2015   History of Present Illness  31 w hx CVA's and complex partial seizures. Admitted with R weakness and worsening aphasia, workup consistent with recurrent seizures and status epilepticus. She became obtunded after ativan and dilantin, required intubation.  Clinical Impression  Pt presents with above.  Note per family pt is close to baseline for mobility and requires HHA for ambulation.  Will continue to see pt acutely to continue to increase mobility and address balance.  Recommend return to SNF when able.      Follow Up Recommendations SNF;Supervision/Assistance - 24 hour    Equipment Recommendations  None recommended by PT    Recommendations for Other Services       Precautions / Restrictions Precautions Precautions: Fall Precaution Comments: hx of CVAs with R weakness, aphasia Restrictions Weight Bearing Restrictions: No      Mobility  Bed Mobility Overal bed mobility: Needs Assistance Bed Mobility: Supine to Sit     Supine to sit: Min assist     General bed mobility comments: Min A to initiate bringing LEs out of bed.   Transfers Overall transfer level: Needs assistance Equipment used: 1 person hand held assist Transfers: Sit to/from Stand Sit to Stand: Min assist         General transfer comment: Min A to initiate task but pt able to elevate with increased time  Ambulation/Gait Ambulation/Gait assistance: Min assist (daughter assisted to motivate pt to ambulate) Ambulation Distance (Feet): 200 Feet Assistive device: 2 person hand held assist Gait Pattern/deviations: Step-through pattern;Decreased stride length;Shuffle;Trunk flexed     General Gait Details: PT and daughter provided HHA throughout gait.  Pt somewhat unsteady but per daughter is about at baseline for mobility.   Stairs            Wheelchair Mobility     Modified Rankin (Stroke Patients Only)       Balance Overall balance assessment: Needs assistance Sitting-balance support: Feet supported Sitting balance-Leahy Scale: Fair     Standing balance support: During functional activity;Bilateral upper extremity supported Standing balance-Leahy Scale: Poor Standing balance comment: Requires UE support to maintain balance                             Pertinent Vitals/Pain Pain Assessment: No/denies pain    Home Living Family/patient expects to be discharged to:: Skilled nursing facility                      Prior Function Level of Independence: Needs assistance   Gait / Transfers Assistance Needed: ambulatory with use of gait belt and staff  ADL's / Homemaking Assistance Needed: assisted        Hand Dominance        Extremity/Trunk Assessment               Lower Extremity Assessment: Generalized weakness      Cervical / Trunk Assessment: Kyphotic  Communication   Communication: Expressive difficulties;HOH  Cognition Arousal/Alertness: Awake/alert Behavior During Therapy: Restless Overall Cognitive Status: History of cognitive impairments - at baseline (per family)                      General Comments      Exercises        Assessment/Plan    PT Assessment Patient needs continued PT services  PT Diagnosis  Generalized weakness   PT Problem List Decreased strength;Decreased activity tolerance;Decreased balance;Decreased mobility  PT Treatment Interventions Gait training;Functional mobility training;Therapeutic activities;Therapeutic exercise;Balance training;Patient/family education   PT Goals (Current goals can be found in the Care Plan section) Acute Rehab PT Goals Patient Stated Goal: none stated PT Goal Formulation: With patient/family Time For Goal Achievement: 03/18/15 Potential to Achieve Goals: Good    Frequency Min 2X/week   Barriers to discharge         Co-evaluation               End of Session Equipment Utilized During Treatment: Gait belt Activity Tolerance: Patient tolerated treatment well Patient left: in chair;with call bell/phone within reach;with family/visitor present Nurse Communication: Mobility status (family aware they need to notify RN if leaving)         Time: 1716-1735 PT Time Calculation (min) (ACUTE ONLY): 19 min   Charges:   PT Evaluation $Initial PT Evaluation Tier I: 1 Procedure     PT G CodesDenice Bors 03/11/2015, 5:46 PM

## 2015-03-11 NOTE — Progress Notes (Signed)
PULMONARY / CRITICAL CARE MEDICINE   Name: Tanya White MRN: [id number] DOB: 01-23-1936    ADMISSION DATE:  03/09/2015 CONSULTATION DATE:  03/09/15  REFERRING MD :  Dr Leonel Ramsay, Neurology  CHIEF COMPLAINT:  VDRF  INITIAL PRESENTATION: 16 w hx CVA's and complex partial seizures. Admitted with R weakness and worsening aphasia, workup consistent with recurrent seizures and status epilepticus. She became obtunded after ativan and dilantin, required intubation. Admitted to ICU  STUDIES:  Head CT 7/24 >> no acute abnormality, atrophy, remote L MCA CVA CT angio Head and neck 7/24 >> no acute infarct, no large vessel occlusion EEG 7/24 > generalized irregular slow activity, no seizure present  SIGNIFICANT EVENTS: Intubated 7/24  SUBJECTIVE: extubated, no distress   VITAL SIGNS: Temp:  [97 F (36.1 C)-99.3 F (37.4 C)] 97.9 F (36.6 C) (07/26 0814) Pulse Rate:  [62-148] 62 (07/26 0700) Resp:  [12-30] 12 (07/26 0700) BP: (95-177)/(50-132) 110/68 mmHg (07/26 0700) SpO2:  [59 %-100 %] 100 % (07/26 0700) FiO2 (%):  [30 %] 30 % (07/25 1213) Weight:  [43.6 kg (96 lb 1.9 oz)] 43.6 kg (96 lb 1.9 oz) (07/26 0500) HEMODYNAMICS:   VENTILATOR SETTINGS: Vent Mode:  [-] PRVC FiO2 (%):  [30 %] 30 % Set Rate:  [16 bmp] 16 bmp Vt Set:  [310 mL] 310 mL PEEP:  [5 cmH20] 5 cmH20 Plateau Pressure:  [12 cmH20] 12 cmH20 INTAKE / OUTPUT:  Intake/Output Summary (Last 24 hours) at 03/11/15 0825 Last data filed at 03/11/15 0700  Gross per 24 hour  Intake 1100.38 ml  Output    855 ml  Net 245.38 ml    PHYSICAL EXAMINATION:  Gen: no distress HENT: jvd wnl , good cough PULM: CTA, good air entry CV: RRR, no mgr, trace edema GI: BS+, soft, nontender Derm: no cyanosis or rash Neuro: awake, follows commands   LABS:  CBC  Recent Labs Lab 03/09/15 1405 03/09/15 1406 03/10/15 0210  WBC 7.6  --  9.2  HGB 13.3 13.9 12.1  HCT 37.6 41.0 33.9*  PLT 162  --  152   Coag's  Recent  Labs Lab 03/09/15 1450  APTT 30  INR 1.43   BMET  Recent Labs Lab 03/09/15 1450 03/10/15 0210 03/11/15 0230  NA 138 135 138  K 4.5 5.1 3.5  CL 103 101 105  CO2 26 25 25   BUN 21* 17 12  CREATININE 0.95 0.93 0.91  GLUCOSE 115* 92 91   Electrolytes  Recent Labs Lab 03/09/15 1450 03/10/15 0210 03/11/15 0230  CALCIUM 9.2 8.5* 8.2*  MG  --  1.9  --   PHOS  --  3.9  --    Sepsis Markers No results for input(s): LATICACIDVEN, PROCALCITON, O2SATVEN in the last 168 hours. ABG  Recent Labs Lab 03/09/15 2111  PHART 7.443  PCO2ART 40.1  PO2ART 185.0*   Liver Enzymes  Recent Labs Lab 03/09/15 1450  AST 20  ALT 12*  ALKPHOS 67  BILITOT 0.7  ALBUMIN 3.7   Cardiac Enzymes No results for input(s): TROPONINI, PROBNP in the last 168 hours. Glucose  Recent Labs Lab 03/09/15 1402 03/09/15 2010  GLUCAP 124* 97    Imaging  7/24 CXR > normal lung fields, ETT in place  ASSESSMENT / PLAN:  PULMONARY OETT 7/24 >>  A: VDRF due to altered MS> no respiratory mechanic or oxygenation issues Hx two small RLL PE's 04/2014 Hx L effusion 04/2014 P:   IS Prn albuterol  Consider restart Eliquis  CARDIOVASCULAR  A: HTN ? Hx R atrial thrombus; mentioned in d/c summary but not reported on TTE from [facility] 04/2014 or most recent TTE from [facility] 02/2014 P:  pcxr without edema, lasix held, tolerated metoprolol Tele monitoring  RENAL A:  No acute issues P:   Once lasix restart, will  Need lytes  GASTROINTESTINAL A:  Nutrition GERD P:   Swallow eval post extubation to determine appropriate diet - awaited Home pepcid dose- remain Neosporin to PEG site  HEMATOLOGIC A:  Fe deficiency anemia P:  Restart FeSulfate when able to take enteral meds- if remains in icu, dc Limit iron in icu  INFECTIOUS A:  UTI P:   Ceftriaxone consider 7 days Has foley now  ENDOCRINE A:    No acute issues P:   Follow glucose on VBMP  NEUROLOGIC A:  Status epilepticus, complex  partial seizures, likely due to missed medications 7/23. No evidence for any other clear triggers, infxn etc Hx CVA with baseline debilitation including aphasia P:   PT consult Per neuro, keppra   FAMILY  - Updates: no family at bedside  - Inter-disciplinary family meet or Palliative Care meeting due by:  Day 7  toi traid, floor   Lavon Paganini. Titus Mould, MD, [place] Pgr: [facility]

## 2015-03-11 NOTE — Clinical Social Work Note (Signed)
Clinical Social Work Assessment  Patient Details  Name: Tanya White MRN: [id number] Date of Birth: 18-Aug-1935  Date of referral:  03/11/15               Reason for consult:  Facility Placement                Permission sought to share information with:  Family Supports Permission granted to share information::  Yes, Verbal Permission Granted  Name::     Cathlean Sauer  Relationship::  Nephew  Contact Information:  [phone]  Housing/Transportation Living arrangements for the past 2 months:  [place] ([facility]) Source of Information:  Other (Comment Required) (Family - Nephew) Patient Interpreter Needed:  None Criminal Activity/Legal Involvement Pertinent to Current Situation/Hospitalization:  No - Comment as needed Significant Relationships:  Other Family Members, Adult Children Lives with:  Facility Resident Do you feel safe going back to the place where you live?  Yes Need for family participation in patient care:  Yes (Comment)  Care giving concerns:  Patient nephew states that they have placed a bed hold at [facility] to ensure patient return.  Patient nephew requested confirmation with facility - CSW confirmed with admissions coordinator that facility does have a bed hold and patient may return at discharge.   Social Worker assessment / plan:  Holiday representative spoke with patient nephew over the phone to offer support and discuss patient needs at discharge.  Patient nephew states that patient has been a long term resident at [facility] and will return at discharge.  Bed hold has been placed and confirmed.  CSW updated facility and family of patient potential discharge plans this week.  CSW remains available for support and to facilitate patient discharge needs once medically ready.  Employment status:  Retired Nurse, adult, Medicaid In [place] PT Recommendations:  [place] /  Referral to community resources:  [place]  Patient/Family's Response to care:  Patient nephew appreciative of medical team support and aware of CSW role.  Patient nephew would like an update once medically appropriate for discharge.   Patient/Family's Understanding of and Emotional Response to Diagnosis, Current Treatment, and Prognosis:  Patient family understanding of patient current condition and commented that patient is near baseline.    Emotional Assessment Appearance:  Appears stated age Attitude/Demeanor/Rapport:  Unable to Assess Affect (typically observed):  Unable to Assess Orientation:  Oriented to Self Alcohol / Substance use:  Not Applicable Psych involvement (Current and /or in the community):  No (Comment)  Discharge Needs  Concerns to be addressed:  Discharge Planning Concerns Readmission within the last 30 days:  No Current discharge risk:  None Barriers to Discharge:  Continued Medical Work up  [employer], [place]

## 2015-03-11 NOTE — Progress Notes (Signed)
Patients dentures was delivered to family

## 2015-03-11 NOTE — Progress Notes (Signed)
NEURO HOSPITALIST PROGRESS NOTE   SUBJECTIVE:                                                                                                                        Successfully extubated. She is alert and awake but seems to be confused. As per family, she is at her baseline. On vimpat, keppra, depacon, and neurontin.    OBJECTIVE:                                                                                                                           Vital signs in last 24 hours: Temp:  [97 F (36.1 C)-99.3 F (37.4 C)] 97.9 F (36.6 C) (07/26 0814) Pulse Rate:  [62-148] 62 (07/26 0700) Resp:  [12-30] 12 (07/26 0700) BP: (95-177)/(50-132) 110/68 mmHg (07/26 0700) SpO2:  [59 %-100 %] 100 % (07/26 0700) FiO2 (%):  [30 %] 30 % (07/25 1213) Weight:  [43.6 kg (96 lb 1.9 oz)] 43.6 kg (96 lb 1.9 oz) (07/26 0500)  Intake/Output from previous day: 07/25 0701 - 07/26 0700 In: 1245.5 [I.V.:883; IV Piggyback:362.5] Out: 1080 [Urine:1080] Intake/Output this shift:   Nutritional status:    Past Medical History  Diagnosis Date  . Seizures   . Hypotension   . Pneumonia   . Stroke   . GI bleed   . Pulmonary embolism   . Gastrostomy tube in place   . Memory loss   . TTP (thrombotic thrombocytopenic purpura)     Treated with PLEX in 2012 at [facility]   Neurologic Exam:  General: Mental Status: Alert and awake, speaks few words but doesn't follow commands Cranial Nerves: II: Discs not assessed; Visual fields grossly normal, pupils equal, round, reactive to light and accommodation III,IV, VI: ptosis not present, extra-ocular motions intact bilaterally V,VII: smile symmetric, facial light touch sensation normal bilaterally VIII: hearing normal bilaterally IX,X: uvula rises symmetrically XI: bilateral shoulder shrug XII: midline tongue extension without atrophy or fasciculations  Motor: On physical restrains, move the left side better than the  right (at baseline has right sided weakness) Tone and bulk:normal tone throughout; no atrophy noted Sensory: moves all limbs to painful stimuli Deep Tendon Reflexes:  1+ all over Plantars: No tested Cerebellar: No tested  Gait: Unable to test due to multiple leads, safety reasons    Lab Results: No results found for: CHOL Lipid Panel No results for input(s): CHOL, TRIG, HDL, CHOLHDL, VLDL, LDLCALC in the last 72 hours.  Studies/Results: Ct Angio Head W/cm &/or Wo Cm  03/09/2015   CLINICAL DATA:  79 year old female with right-sided weakness. History of seizures, memory loss and prior stroke. Subsequent encounter.  EXAM: CT ANGIOGRAPHY HEAD AND NECK  TECHNIQUE: Multidetector CT imaging of the head and neck was performed using the standard protocol during bolus administration of intravenous contrast. Multiplanar CT image reconstructions and MIPs were obtained to evaluate the vascular anatomy. Carotid stenosis measurements (when applicable) are obtained utilizing NASCET criteria, using the distal internal carotid diameter as the denominator.  CONTRAST:  59mL OMNIPAQUE IOHEXOL 350 MG/ML SOLN  COMPARISON:  03/09/2015 head CT.  07/17/2004 brain MR.  FINDINGS: CT HEAD  Brain: Remote left posterior frontal -parietal lobe infarct with encephalomalacia.  No CT evidence of large acute infarct.  No intracranial hemorrhage.  No intracranial enhancing lesion.  No hydrocephalus.  Calvarium and skull base: Negative  Paranasal sinuses: Clear  Orbits: Post lens replacement otherwise negative.  CTA NECK  Aortic arch: 3 vessel arch with atherosclerotic type changes aortic arch and origin of great vessels without high-grade stenosis.  Right carotid system: Ectatic right common carotid artery. Plaque right carotid bifurcation with 72% diameter stenosis proximal right internal carotid artery. Markedly ectatic distal vertical cervical segment right internal carotid artery.  Left carotid system: Ectatic left common  carotid artery and left internal carotid artery. Calcified plaque proximal left internal carotid artery with 44% maximal diameter stenosis.  Vertebral arteries:Fold/kink right subclavian artery with moderate narrowing. Right vertebral artery is dominant and ectatic with areas of narrowing without high-grade stenosis. Ectatic left vertebral artery with areas of mild moderate irregularity and mild narrowing.  Skeleton: Cervical spondylotic changes most notable C5-6 and C6-7.  Other neck: Motion degradation without worrisome neck mass. The ectatic common carotid arteries traversed posteriorly immediately adjacent to the cervical esophagus. No worrisome lung apical lesion.  CTA HEAD  Anterior circulation: Mild narrowing petrous segment internal carotid artery bilaterally. Calcified plaque cavernous segment internal carotid artery bilaterally with mild narrowing on the right and mild slightly moderate narrowing on the left.  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  Mild kink at the middle cerebral artery bifurcation bilaterally with mild narrowing greater on the left. No significant stenosis of the M1 segment of either middle cerebral artery. Decrease number branch vessels left middle cerebral artery consistent with patient's remote infarct.  Posterior circulation: Left vertebral artery ends in a posterior inferior cerebellar artery distribution. No high-grade stenosis of the right vertebral artery or basilar artery. Mild posterior cerebral artery distal branch vessel irregularity bilaterally.  Poor delineation anterior inferior cerebral arteries.  Venous sinuses: Negative  Anatomic variants: Negative  Delayed phase: Negative  IMPRESSION: CT HEAD  Remote left posterior frontal -parietal lobe infarct with encephalomalacia.  No CT evidence of large acute infarct.  CTA NECK  Plaque right carotid bifurcation with 72% diameter stenosis proximal right internal carotid artery.  Calcified plaque proximal left  internal carotid artery with 44% maximal diameter stenosis.  Fold/kink right subclavian artery with moderate narrowing.  Right vertebral artery is dominant and ectatic with areas of narrowing without high-grade stenosis.  Ectatic left vertebral artery with areas of mild to moderate irregularity and mild narrowing.  CTA HEAD  Calcified plaque cavernous segment internal carotid artery bilaterally with  mild narrowing on the right and mild slightly moderate narrowing on the left.  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  Mild kink at the middle cerebral artery bifurcation bilaterally with mild narrowing greater on the left. No significant stenosis of the M1 segment of either middle cerebral artery. Decrease number branch vessels left middle cerebral artery consistent with patient's remote infarct.  Left vertebra artery ends in a posterior inferior cerebellar artery distribution.  No high-grade stenosis of the right vertebral artery or basilar artery.  Mild posterior cerebral artery distal branch vessel irregularity bilaterally.  Poor delineation anterior inferior cerebral arteries.  Images reviewed preliminarily at the time of imaging with Dr. Leonel Ramsay.  Patient with infiltration (saline) evaluated by Dr. Wyvonnia Dusky of emergency room staff.   Electronically Signed   By: Genia Del M.D.   On: 03/09/2015 14:54   Ct Head Wo Contrast  03/09/2015   CLINICAL DATA:  Increased right side weakness. Aphasia.  EXAM: CT HEAD WITHOUT CONTRAST  TECHNIQUE: Contiguous axial images were obtained from the base of the skull through the vertex without intravenous contrast.  COMPARISON:  Brain MRI 07/17/2014.  Head CT scan 07/16/2014.  FINDINGS: Atrophy and chronic microvascular ischemic change are again identified. Remote infarct in the posterior left MCA territory is again seen. There is no evidence of acute infarction, hemorrhage, mass lesion, mass effect, midline shift or abnormal extra-axial fluid collection.  No hydrocephalus or pneumocephalus. The calvarium is intact. Imaged paranasal sinuses and mastoid air cells are clear. Atherosclerosis noted.  IMPRESSION: No acute abnormality.  Atrophy, chronic microvascular ischemic change remote posterior left MCA infarct.  Critical Value/emergent results were called by telephone at the time of interpretation on 03/09/2015 at 1:54 pm to Dr. Ezequiel Essex , who verbally acknowledged these results.   Electronically Signed   By: Inge Rise M.D.   On: 03/09/2015 13:55   Ct Angio Neck W/cm &/or Wo/cm  03/09/2015   CLINICAL DATA:  79 year old female with right-sided weakness. History of seizures, memory loss and prior stroke. Subsequent encounter.  EXAM: CT ANGIOGRAPHY HEAD AND NECK  TECHNIQUE: Multidetector CT imaging of the head and neck was performed using the standard protocol during bolus administration of intravenous contrast. Multiplanar CT image reconstructions and MIPs were obtained to evaluate the vascular anatomy. Carotid stenosis measurements (when applicable) are obtained utilizing NASCET criteria, using the distal internal carotid diameter as the denominator.  CONTRAST:  76mL OMNIPAQUE IOHEXOL 350 MG/ML SOLN  COMPARISON:  03/09/2015 head CT.  07/17/2004 brain MR.  FINDINGS: CT HEAD  Brain: Remote left posterior frontal -parietal lobe infarct with encephalomalacia.  No CT evidence of large acute infarct.  No intracranial hemorrhage.  No intracranial enhancing lesion.  No hydrocephalus.  Calvarium and skull base: Negative  Paranasal sinuses: Clear  Orbits: Post lens replacement otherwise negative.  CTA NECK  Aortic arch: 3 vessel arch with atherosclerotic type changes aortic arch and origin of great vessels without high-grade stenosis.  Right carotid system: Ectatic right common carotid artery. Plaque right carotid bifurcation with 72% diameter stenosis proximal right internal carotid artery. Markedly ectatic distal vertical cervical segment right internal carotid  artery.  Left carotid system: Ectatic left common carotid artery and left internal carotid artery. Calcified plaque proximal left internal carotid artery with 44% maximal diameter stenosis.  Vertebral arteries:Fold/kink right subclavian artery with moderate narrowing. Right vertebral artery is dominant and ectatic with areas of narrowing without high-grade stenosis. Ectatic left vertebral artery with areas of mild moderate irregularity and  mild narrowing.  Skeleton: Cervical spondylotic changes most notable C5-6 and C6-7.  Other neck: Motion degradation without worrisome neck mass. The ectatic common carotid arteries traversed posteriorly immediately adjacent to the cervical esophagus. No worrisome lung apical lesion.  CTA HEAD  Anterior circulation: Mild narrowing petrous segment internal carotid artery bilaterally. Calcified plaque cavernous segment internal carotid artery bilaterally with mild narrowing on the right and mild slightly moderate narrowing on the left.  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  Mild kink at the middle cerebral artery bifurcation bilaterally with mild narrowing greater on the left. No significant stenosis of the M1 segment of either middle cerebral artery. Decrease number branch vessels left middle cerebral artery consistent with patient's remote infarct.  Posterior circulation: Left vertebral artery ends in a posterior inferior cerebellar artery distribution. No high-grade stenosis of the right vertebral artery or basilar artery. Mild posterior cerebral artery distal branch vessel irregularity bilaterally.  Poor delineation anterior inferior cerebral arteries.  Venous sinuses: Negative  Anatomic variants: Negative  Delayed phase: Negative  IMPRESSION: CT HEAD  Remote left posterior frontal -parietal lobe infarct with encephalomalacia.  No CT evidence of large acute infarct.  CTA NECK  Plaque right carotid bifurcation with 72% diameter stenosis proximal right  internal carotid artery.  Calcified plaque proximal left internal carotid artery with 44% maximal diameter stenosis.  Fold/kink right subclavian artery with moderate narrowing.  Right vertebral artery is dominant and ectatic with areas of narrowing without high-grade stenosis.  Ectatic left vertebral artery with areas of mild to moderate irregularity and mild narrowing.  CTA HEAD  Calcified plaque cavernous segment internal carotid artery bilaterally with mild narrowing on the right and mild slightly moderate narrowing on the left.  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  Mild kink at the middle cerebral artery bifurcation bilaterally with mild narrowing greater on the left. No significant stenosis of the M1 segment of either middle cerebral artery. Decrease number branch vessels left middle cerebral artery consistent with patient's remote infarct.  Left vertebra artery ends in a posterior inferior cerebellar artery distribution.  No high-grade stenosis of the right vertebral artery or basilar artery.  Mild posterior cerebral artery distal branch vessel irregularity bilaterally.  Poor delineation anterior inferior cerebral arteries.  Images reviewed preliminarily at the time of imaging with Dr. Leonel Ramsay.  Patient with infiltration (saline) evaluated by Dr. Wyvonnia Dusky of emergency room staff.   Electronically Signed   By: Genia Del M.D.   On: 03/09/2015 14:54   Dg Chest Port 1 View  03/10/2015   CLINICAL DATA:  Respiratory failure requiring intubation  EXAM: PORTABLE CHEST - 1 VIEW  COMPARISON:  03/09/2015  FINDINGS: Endotracheal tube remains in good position. NG tubes in place with the tip in the stomach. The tip is not visualized.  Lungs are hyperinflated and clear. No infiltrate or effusion. Negative for heart failure.  IMPRESSION: Endotracheal tube in good position.  NG tube now on the stomach.  Lungs remain clear.   Electronically Signed   By: Franchot Gallo M.D.   On: 03/10/2015 07:45    Dg Chest Portable 1 View  03/09/2015   CLINICAL DATA:  Hypoxia  EXAM: PORTABLE CHEST - 1 VIEW  COMPARISON:  Study obtained earlier in the day  FINDINGS: Endotracheal tube tip is 5 mm above the carina currently. No pneumothorax. There is no edema or consolidation. Heart size and pulmonary vascular normal. No adenopathy. No bone lesions. There is atherosclerotic change in aorta.  IMPRESSION: Endotracheal tube tip is 5 mm above carina. Advise withdrawing endotracheal tube approximately 3 cm. No pneumothorax. No lung edema or consolidation.   Electronically Signed   By: Lowella Grip III M.D.   On: 03/09/2015 17:03   Dg Chest Portable 1 View  03/09/2015   CLINICAL DATA:  Endotracheal tube placement  EXAM: PORTABLE CHEST - 1 VIEW  COMPARISON:  07/17/2014  FINDINGS: Endotracheal tube with the tip projecting over the right mainstem bronchus. Recommend retracting the endotracheal tube 5.5 cm.  There is no focal parenchymal opacity. There is no pleural effusion or pneumothorax. The heart and mediastinal contours are unremarkable.  The osseous structures are unremarkable.  IMPRESSION: Endotracheal tube with the tip projecting over the right mainstem bronchus. Recommend retracting the endotracheal tube 5.5 cm. These results were called by telephone at the time of interpretation on 03/09/2015 at 4:14 pm to Dr. Ezequiel Essex , who verbally acknowledged these results.   Electronically Signed   By: Kathreen Devoid   On: 03/09/2015 16:19   Dg Abd Portable 1v  03/09/2015   CLINICAL DATA:  Orogastric tube placement  EXAM: PORTABLE ABDOMEN - 1 VIEW  COMPARISON:  Chest radiograph obtained earlier in the day  FINDINGS: Orogastric tube tip and side port in stomach. Visualized bowel gas pattern normal.  Endotracheal tube tip is 1 cm above the carina. Visualized lungs are clear. No pneumothorax evident. Heart size and pulmonary vascularity are normal.  IMPRESSION: Nasogastric tube and endotracheal tube as described. Bowel gas  pattern normal. Visualized lungs clear.   Electronically Signed   By: Lowella Grip III M.D.   On: 03/09/2015 21:39    MEDICATIONS                                                                                                                        Scheduled: . antiseptic oral rinse  7 mL Mouth Rinse QID  . apixaban  5 mg Oral BID  . cefTRIAXone (ROCEPHIN)  IV  1 g Intravenous Q24H  . chlorhexidine  15 mL Mouth Rinse BID  . Chlorhexidine Gluconate Cloth  6 each Topical Q0600  . famotidine  20 mg Oral BID  . gabapentin  100 mg Oral BID  . heparin  5,000 Units Subcutaneous 3 times per day  . lacosamide (VIMPAT) IV  200 mg Intravenous Q12H  . levETIRAcetam  1,000 mg Intravenous Q12H  . metoprolol  5 mg Intravenous 4 times per day  . multivitamin  5 mL Oral Daily  . mupirocin ointment  1 application Nasal BID  . neomycin-bacitracin-polymyxin  1 application Topical BID  . valproate sodium  125 mg Intravenous 3 times per day    ASSESSMENT/PLAN:  79 year old female presenting with what is likely partial status epilepticus resulting from non complaince. EEG obtained and showed no further seizures. Currently extubated and without further clinical seizures noted.  Patient has underlying UTI that could also contributed to her presentation with partial SE. Continue current AED regimen As per family, patient has similar presentation with prior UTI's and she hasn't had further seizures. Neurology will sign off.  Dorian Pod, MD [facility] [phone]  03/11/2015, 8:38 AM

## 2015-03-12 ENCOUNTER — Ambulatory Visit: Payer: 59 | Admitting: Nurse Practitioner

## 2015-03-12 DIAGNOSIS — N390 Urinary tract infection, site not specified: Secondary | ICD-10-CM

## 2015-03-12 LAB — BASIC METABOLIC PANEL
ANION GAP: 5 (ref 5–15)
BUN: 7 mg/dL (ref 6–20)
CHLORIDE: 107 mmol/L (ref 101–111)
CO2: 27 mmol/L (ref 22–32)
Calcium: 8.5 mg/dL — ABNORMAL LOW (ref 8.9–10.3)
Creatinine, Ser: 0.79 mg/dL (ref 0.44–1.00)
GLUCOSE: 88 mg/dL (ref 65–99)
Potassium: 3.4 mmol/L — ABNORMAL LOW (ref 3.5–5.1)
Sodium: 139 mmol/L (ref 135–145)

## 2015-03-12 MED ORDER — BOOST / RESOURCE BREEZE PO LIQD
1.0000 | Freq: Two times a day (BID) | ORAL | Status: DC
  Administered 2015-03-12: 1 via ORAL

## 2015-03-12 MED ORDER — MAGNESIUM OXIDE 400 MG PO TABS: 400.0000 mg | ORAL_TABLET | Freq: Every day | ORAL | Status: AC

## 2015-03-12 MED ORDER — CEPHALEXIN 500 MG PO CAPS: 500.0000 mg | ORAL_CAPSULE | Freq: Two times a day (BID) | ORAL | Status: DC

## 2015-03-12 MED ORDER — CEPHALEXIN 500 MG PO CAPS: 500.0000 mg | ORAL_CAPSULE | Freq: Two times a day (BID) | ORAL | Status: AC

## 2015-03-12 MED ORDER — METOPROLOL TARTRATE 1 MG/ML IV SOLN
2.5000 mg | Freq: Four times a day (QID) | INTRAVENOUS | Status: DC
  Administered 2015-03-12 (×2): 2.5 mg via INTRAVENOUS
  Filled 2015-03-12: qty 5

## 2015-03-12 MED ORDER — POTASSIUM CHLORIDE 10 MEQ/100ML IV SOLN
10.0000 meq | INTRAVENOUS | Status: AC
  Administered 2015-03-12 (×2): 10 meq via INTRAVENOUS
  Filled 2015-03-12 (×3): qty 100

## 2015-03-12 MED ORDER — POTASSIUM CHLORIDE 20 MEQ/15ML (10%) PO SOLN: 40.0000 meq | Freq: Once | ORAL | Status: DC

## 2015-03-12 NOTE — Clinical Social Work Note (Signed)
Clinical Social Worker informed that patient's potassium has been low and will require potassium through IV which will take about 3 hours which may prevent patient from discharging today. CSW has paged NP twice within 2 hours to notify her of potential barriers of discharge.   CSW will update facility. CSW remains available as needed.   Glendon Axe, MSW, LCSWA [phone] 03/12/2015 4:00 PM

## 2015-03-12 NOTE — Progress Notes (Signed)
PA came to sign FL2 form for patient discharge.  Explained discharge info to family including what medication were still to be given, and follow up dates.  Removed patients iv and rechecked vitals signs which were WNL.  Patient nor family has any further question.  NT to wheel the patient out to vehicle for family to transport.

## 2015-03-12 NOTE — Clinical Social Work Note (Signed)
Clinical Social Worker has paged [phone]) on-call MD to sign FL-2 on chart. RN notified.  Glendon Axe, MSW, [place] [phone] 03/12/2015 5:01 PM

## 2015-03-12 NOTE — Clinical Social Work Note (Signed)
Clinical Social Worker contacted facility, [facility] which confirmed that patient can return once IV potassium is completed. Patient cannot return to facility with IV.   CSW will contact evening CSW to facilitate discharge and transportation. DC packet prepared and on chart for transport.   Clinical Social Worker facilitated patient discharge including contacting patient family (left message) and facility to confirm patient discharge plans.  Clinical information faxed to facility and family agreeable with plan.  CSW arranged ambulance transport via [facility] to .  RN to call report prior to discharge.  Clinical Social Worker will sign off for now as social work intervention is no longer needed. Please consult [place] again if new need arises.  Glendon Axe, MSW, LCSWA [phone] 03/12/2015 4:06 PM

## 2015-03-12 NOTE — Discharge Summary (Signed)
Physician Discharge Summary  Patient ID: Tanya White MRN: [id number] DOB/AGE: [birth year] 77 y.o.  Admit date: 03/09/2015 Discharge date: 03/12/2015    Discharge Diagnoses:  Active Problems:   Acute encephalopathy   Seizures   Acute respiratory failure   Acute respiratory failure, unspecified whether with hypoxia or hypercapnia   Encounter for imaging study to confirm orogastric (OG) tube placement    Brief Summary: Tanya White is a 79 y.o. y/o female with a PMH of CVA's, HTN, partial complex seizures initially admitted 7/24 with R sided weakness and worsening aphasia.  Seen by neurology with workup c/w recurrent seizures and status epilepticus likely precipitated by UTI +/- a missed medication.  After ativan she became obtunded requiring intubation.  She was ventilated overnight, treated with propofol, vimpat, keppra, valproate.  After propofol EEG revealed no seizure activity.  Propofol was weaned with improved mental status and she was extubated 7/25.  Found to have [facility] UTI, treated with IV abx.  She remained confused which cleared slowly.  She slowly returned to her baseline mental status and was tx to floor.  She was followed by speech therapy and initially did not pass swallow eval, but on 7/27 passed swallow eval and ok for PO intake including meds.  Some difficulty from a behavior standpoint with refusing meds at times but, per family, this is her baseline and family and staff at SNF know to be patient with her and keep trying meds intermittently throughout the day. Pt has had no further seizure activity, is at baseline mental status and ready for d/c to SNF.    STUDIES:  Head CT 7/24 >> no acute abnormality, atrophy, remote L MCA CVA CT angio Head and neck 7/24 >> no acute infarct, no large vessel occlusion EEG 7/24 > generalized irregular slow activity, no seizure present  SIGNIFICANT EVENTS: Intubated 7/24>>>7/25   Microbiology/Sepsis markers: Urine 7/25>>> Ecoli>>>  pan sens                                                                      D/c plan by Discharge Diagnosis  OETT 7/24 >> 7/25 VDRF due to altered MS in setting seizure> resolved  Hx PE 2015 P:  Mobilize  Prn albuterol  Continue home eliquis    Hx HTN ? Hx R atrial thrombus; mentioned in d/c summary but not reported on TTE from [facility] 04/2014 or most recent TTE from [facility] 02/2014 P:  Continue home metoprolol, eliquis  eliquis as above  PRN lasix as prior    Hypokalemia - mild  P:  Replete K prior to d/c  F/u chem  K supplement if requires PRN lasix   Nutrition/dysphagia - having difficulty taking PO's, not able to take PO meds  GERD P:  Passed speech eval - will need close monitoring and persistence from staff to ensure she gets her seizure meds    Fe deficiency anemia P:  Cont FeSulfate    UTI P:  Urine 7/25>>> Ecoli>>> pan sens  Ceftriaxone 7/25>>>7/27 Keflex 7/27>>>planned 7/31 - 7 days   Status epilepticus, complex partial seizures, likely due to missed medications.  Hx CVA with baseline debilitation including aphasia P:  PT/OT Cont keppra, vimpat, valproate  Will need prompting and persistence from SNF staff  as mentioned above to ensure she takes all PO meds     Filed Vitals:   03/12/15 0500 03/12/15 0636 03/12/15 0923 03/12/15 1309  BP:  124/65 109/56 96/80  Pulse:  66 72 82  Temp:  97.6 F (36.4 C) 98.2 F (36.8 C) 98.2 F (36.8 C)  TempSrc:  Axillary Axillary Oral  Resp:  _0 Height:      Weight: 99 lb 8 oz (45.133 kg)     SpO2:  100% 93% 96%     Discharge Labs  BMET  Recent Labs Lab 03/09/15 1406 03/09/15 1450 03/10/15 0210 03/11/15 0230 03/12/15 0527  NA 141 138 135 138 139  K 4.9 4.5 5.1 3.5 3.4*  CL 104 103 101 105 107  CO2  --  _1 GLUCOSE 119* 115* 92 91 88  BUN 30* 21* _2 CREATININE 1.00 0.95 0.93 0.91 0.79  CALCIUM  --  9.2 8.5* 8.2* 8.5*  MG  --   --  1.9  --   --   PHOS  --    --  3.9  --   --      CBC   Recent Labs Lab 03/09/15 1405 03/09/15 1406 03/10/15 0210  HGB 13.3 13.9 12.1  HCT 37.6 41.0 33.9*  WBC 7.6  --  9.2  PLT 162  --  152   Anti-Coagulation  Recent Labs Lab 03/09/15 1450  INR 1.43            Follow-up Information    Follow up with Marcial Pacas, MD On 03/19/2015.   Specialty:  Neurology   Why:  1:45pm    Contact information:   [place] [address] [phone]       Follow up with GIBSON,DAVID, MD. Schedule an appointment as soon as possible for a visit in 1 week.   Specialty:  Family Medicine   Contact information:   [address] [facility] [place] [address] [phone]          Medication List    STOP taking these medications        free water Soln     LORazepam 0.5 MG tablet  Commonly known as:  ATIVAN     multivitamin Liqd     PRESCRIPTION MEDICATION      TAKE these medications        acetaminophen 325 MG tablet  Commonly known as:  TYLENOL  Take 2 tablets (650 mg total) by mouth every 6 (six) hours as needed for mild pain or fever.     albuterol (2.5 MG/3ML) 0.083% nebulizer solution  Commonly known as:  PROVENTIL  Take 2.5 mg by nebulization every 2 (two) hours as needed for wheezing or shortness of breath.     apixaban 5 MG Tabs tablet  Commonly known as:  ELIQUIS  Take 1 tablet (5 mg total) by mouth 2 (two) times daily.     cephALEXin 500 MG capsule  Commonly known as:  KEFLEX  Take 1 capsule (500 mg total) by mouth 2 (two) times daily.     divalproex 125 MG capsule  Commonly known as:  DEPAKOTE SPRINKLE  Take 125 mg by mouth 2 (two) times daily.     famotidine 20 MG tablet  Commonly known as:  PEPCID  Take 20 mg by mouth 2 (two) times daily.     feeding supplement (ENSURE COMPLETE) Liqd  Take  237 mLs by mouth 3 (three) times daily between meals.     ferrous sulfate 325 (65 FE) MG tablet  Take 1 tablet (325 mg total)  by mouth daily.     furosemide 40 MG tablet  Commonly known as:  LASIX  Take 1 tablet (40 mg total) by mouth daily as needed (for leg swelling.  Give PRN potassium when Lasix is given).     gabapentin 100 MG capsule  Commonly known as:  NEURONTIN  Take 100 mg by mouth 2 (two) times daily.     lacosamide 200 MG Tabs tablet  Commonly known as:  VIMPAT  Take 1 tablet (200 mg total) by mouth 2 (two) times daily.     levETIRAcetam 1000 MG tablet  Commonly known as:  KEPPRA  Take 1,000 mg by mouth 2 (two) times daily.     magnesium oxide 400 MG tablet  Commonly known as:  MAG-OX  Take 1 tablet (400 mg total) by mouth daily.     metoprolol tartrate 12.5 mg Tabs tablet  Commonly known as:  LOPRESSOR  Take 1.5 tablets (37.5 mg total) by mouth 2 (two) times daily.     multivitamin with minerals Tabs tablet  Take 1 tablet by mouth daily.     neomycin-bacitracin-polymyxin Oint  Commonly known as:  NEOSPORIN  Apply 1 application topically 2 (two) times daily. Apply to skin around PEG tube insertion site.     ondansetron 4 MG tablet  Commonly known as:  ZOFRAN  Take 1 tablet (4 mg total) by mouth every 4 (four) hours as needed for nausea.     potassium chloride SA 20 MEQ tablet  Commonly known as:  KLOR-CON M20  Take 1 tablet (20 mEq total) by mouth daily as needed (whenever she is given Lasix).          Disposition: 03-Skilled Nursing Facility   Discharged Condition: Tanya White has met maximum benefit of inpatient care and is medically stable and cleared for discharge.  Patient is pending follow up as above.      Time spent on disposition:  Greater than 35 minutes.   SignedDarlina Sicilian, NP 03/12/2015  3:16 PM Pager: [phone] or [phone]  *Care during the described time interval was provided by me and/or other providers on the critical care team. I have reviewed this patient's available data, including medical history, events of note, physical  examination and test results as part of my evaluation.

## 2015-03-12 NOTE — Progress Notes (Signed)
PULMONARY / CRITICAL CARE MEDICINE   Name: Tanya White MRN: [id number] DOB: September 17, 1935    ADMISSION DATE:  03/09/2015 CONSULTATION DATE:  03/09/15  REFERRING MD :  Dr Leonel Ramsay, Neurology  CHIEF COMPLAINT:  VDRF  INITIAL PRESENTATION: 77 w hx CVA's and complex partial seizures. Admitted with R weakness and worsening aphasia, workup consistent with recurrent seizures and status epilepticus. She became obtunded after ativan and dilantin, required intubation. Admitted to ICU  STUDIES:  Head CT 7/24 >> no acute abnormality, atrophy, remote L MCA CVA CT angio Head and neck 7/24 >> no acute infarct, no large vessel occlusion EEG 7/24 > generalized irregular slow activity, no seizure present  SIGNIFICANT EVENTS: Intubated 7/24>>>7/25  SUBJECTIVE:  No sig change.  Awake.  Confused.  Having difficulties with PO intake.  Meds changed back to IV overnight.    VITAL SIGNS: Temp:  [97.6 F (36.4 C)-98.8 F (37.1 C)] 98.2 F (36.8 C) (07/27 0923) Pulse Rate:  [66-90] 72 (07/27 0923) Resp:  [16-18] 18 (07/27 0923) BP: (109-166)/(56-87) 109/56 mmHg (07/27 0923) SpO2:  [93 %-100 %] 93 % (07/27 0923) Weight:  [99 lb 8 oz (45.133 kg)] 99 lb 8 oz (45.133 kg) (07/27 0500)    INTAKE / OUTPUT:  Intake/Output Summary (Last 24 hours) at 03/12/15 1031 Last data filed at 03/12/15 0600  Gross per 24 hour  Intake    360 ml  Output    750 ml  Net   -390 ml    PHYSICAL EXAMINATION:  Gen: no distress HENT: mm moist, no JVD  PULM: resps even non labored on RA, CTA, good air entry CV: RRR, no mgr, trace edema GI: BS+, soft, nontender Derm: no cyanosis or rash Neuro: awake, follows some commands but takes much prompting, confused    LABS:  CBC  Recent Labs Lab 03/09/15 1405 03/09/15 1406 03/10/15 0210  WBC 7.6  --  9.2  HGB 13.3 13.9 12.1  HCT 37.6 41.0 33.9*  PLT 162  --  152   Coag's  Recent Labs Lab 03/09/15 1450  APTT 30  INR 1.43   BMET  Recent Labs Lab  03/10/15 0210 03/11/15 0230 03/12/15 0527  NA 135 138 139  K 5.1 3.5 3.4*  CL 101 105 107  CO2 25 25 27   BUN 17 12 7   CREATININE 0.93 0.91 0.79  GLUCOSE 92 91 88   Electrolytes  Recent Labs Lab 03/10/15 0210 03/11/15 0230 03/12/15 0527  CALCIUM 8.5* 8.2* 8.5*  MG 1.9  --   --   PHOS 3.9  --   --    Sepsis Markers No results for input(s): LATICACIDVEN, PROCALCITON, O2SATVEN in the last 168 hours. ABG  Recent Labs Lab 03/09/15 2111  PHART 7.443  PCO2ART 40.1  PO2ART 185.0*   Liver Enzymes  Recent Labs Lab 03/09/15 1450  AST 20  ALT 12*  ALKPHOS 67  BILITOT 0.7  ALBUMIN 3.7   Cardiac Enzymes No results for input(s): TROPONINI, PROBNP in the last 168 hours. Glucose  Recent Labs Lab 03/09/15 1402 03/09/15 2010  GLUCAP 124* 97    Imaging    ASSESSMENT / PLAN:   OETT 7/24 >> 7/25 VDRF due to altered MS in setting seizure> no respiratory mechanic or oxygenation issues Hx PE 2015 P:   IS Prn albuterol  Continue home eliquis  Mobilize   Hx HTN ? Hx R atrial thrombus; mentioned in d/c summary but not reported on [facility]TE from [facility] 04/2014 or most recent TTE from  [facility] 02/2014 P:  Decrease metoprolol to 2.5mg  q6 with borderline BP  Tele monitoring eliquis as above    Hypokalemia - mild  P:  Replete K  F/u chem   Nutrition/dysphagia - having difficulty taking PO's, not able to take PO meds  GERD P:   Speech following - will re-asses 7/27 Will need to establish whether she can consistently take PO meds including anti-epileptics prior to d/c back to SNF  Cont meds IV for now    Fe deficiency anemia P:  Restart FeSulfate when able to take enteral meds   UTI P:   Urine 7/25>>> Ecoli>>> pan sens  Cont IV Ceftriaxone with PO intake issues - consider PO cipro when able  consider 7 days D/c foley    Status epilepticus, complex partial seizures, likely due to missed medications.  Hx CVA with baseline debilitation including aphasia P:    PT/OT Cont keppra, vimpat, valproate IV for now while addressing speech issues    DISPO- Likely nearing baseline and ready for d/c back to SNF (has bed hold at [facility] where she is a resident) when speech/nutrition issues resolved.  Need to ensure ability/route to give her meds. Cont IV route for now and await further speech assessment.    FAMILY  - Updates: no family at bedside 7/27.  Discussed with RN and speech.   - Inter-disciplinary family meet or Palliative Care meeting due by:  Day 7    Nickolas Madrid, NP 03/12/2015  10:31 AM Pager: [phone] or [phone]   Attending:  I have seen and examined the patient with nurse practitioner/resident and agree with the note above.   Feeling better Passed speech eval On exam no distress, breathing comfortably Taking pills is a problem.  She can physically swallow them.  Family and SNF aware of this problem and family states they have learned to be patient and persistent with her for swallowing.  This will be key and very important that she continue to take medications, this was discussed with family at length today.  Roselie Awkward, MD [facility] Pager: [phone] Cell: [phone] After 3pm or if no response, call [phone]

## 2015-03-12 NOTE — Clinical Social Work Note (Signed)
Patient can return to [facility] once medically stable.  CSW will continue to follow patient and pt's family for continued support and to facilitate pt's discharge needs once medically stable.   CSW remains available as needed.  Glendon Axe, MSW, LCSWA [phone] 03/12/2015 11:06 AM

## 2015-03-12 NOTE — Progress Notes (Signed)
Speech Language Pathology Treatment: Dysphagia  Patient Details Name: Tanya White MRN: [id number] DOB: 13-Nov-1935 Today's Date: 03/12/2015 Time: 7829-5621 SLP Time Calculation (min) (ACUTE ONLY): 17 min  Assessment / Plan / Recommendation Clinical Impression  Skilled treatment session focused on addressing skilled observation of current diet and trials of advanced textures. Patient consumed thin liquids via cup with cues for attention to task and initiation with no overt s/s of aspiration.  Dys.1 textures were also set-up for patient and she was able to self feed multiple boluses with increased time for oral clearance with no overt s/s of aspiration.    Of note, RN present with SLP for attempted administrated of pill whole in puree without success.  Patient did best when given pill in her left hand, she placeed pill in her mouth and then she was handed a cup.  Meds whole with thin resulted in no overt s/s of aspiration.    Trials of advanced textures, regular were declined with patient expressing desire to go home.  SLP suspects patient would be able to resume diet she was on prior to admission; however, willingness to participate limited bedside observation and as a result SLP can only recommend what was observed.  Dys.1 textures and thin liquids with meds whole with thin, set-up assist and full supervision.       HPI Other Pertinent Information: 31 w hx CVA's and complex partial seizures. Admitted with R weakness and worsening aphasia, workup consistent with recurrent seizures and status epilepticus. She became obtunded after ativan and dilantin, required intubation. Pt has previously required PEG despite recommendation for regular textures and thin liquids due to cognitively-based dysphagia impacting ability to adequately get nutrition.   Pertinent Vitals Pain Assessment: No/denies pain  SLP Plan  Continue with current plan of care    Recommendations Diet recommendations: Dysphagia 1  (puree);Thin liquid Liquids provided via: Cup Medication Administration: Whole meds with liquid Supervision: Patient able to self feed;Full supervision/cueing for compensatory strategies (set-up) Compensations: Slow rate;Small sips/bites Postural Changes and/or Swallow Maneuvers: Seated upright 90 degrees              Oral Care Recommendations: Oral care BID Follow up Recommendations: 24 hour supervision/assistance Plan: Continue with current plan of care    GO    Carmelia Roller., CCC-SLP [phone]  [place] 03/12/2015, 11:03 AM

## 2015-03-12 NOTE — Clinical Social Work Note (Signed)
Clinical Social Worker facilitated patient discharge including contacting patient family and facility to confirm patient discharge plans.  Clinical information faxed to facility and family agreeable with plan.  CSW arranged ambulance transport via [facility] to [facility].  RN to call report prior to discharge.  DC packet prepared on chart for transport with number for report.  Clinical Social Worker will sign off for now as social work intervention is no longer needed. Please consult [place] again if new need arises.  Glendon Axe, MSW, LCSWA [phone] 03/12/2015 3:27 PM

## 2015-03-12 NOTE — Progress Notes (Signed)
Have been paging Darlina Sicilian NP since 10:30 this morning, I have paged her 5 times, including one page with a 911 suffix, have spoken with Dr. Lamonte Sakai twice, and have notified him of her negligence in regards to returning my pages, he said he would speak with her, she has recently put in discharge orders but then changed the p.o. Potassium to IV, which will run 1 bag an hour for three hours, which may prevent the patient from leaving today, had she returned my 1st page at 10:30 this morning she would have changed the Potassium to IV then because that is what I was paging her about and the patient would certainly be leaving today.

## 2015-03-12 NOTE — Progress Notes (Signed)
Initial Nutrition Assessment  DOCUMENTATION CODES:   Non-severe (moderate) malnutrition in context of chronic illness  INTERVENTION:   Boost Breeze po BID, each supplement provides 250 kcal and 9 grams of protein  RD to monitor PO intake for adequacy   NUTRITION DIAGNOSIS:   Predicted suboptimal nutrient intake related to lethargy/confusion, dysphagia as evidenced by mild depletion of body fat, severe depletion of muscle mass, per patient/family report.   GOAL:   Patient will meet greater than or equal to 90% of their needs   MONITOR:   PO intake, Supplement acceptance, Labs, Weight trends, Skin  REASON FOR ASSESSMENT:   Low Braden    ASSESSMENT:   79 w hx CVA's and complex partial seizures. Admitted with R weakness and worsening aphasia, workup consistent with recurrent seizures and status epilepticus.  Pt very confused at time of visit. Family reports that pt usually weighs between 99 and 104 lbs (admission weight of 93 lbs). Family states that pt doesn't eat much. She used to drink Ensure supplements daily but, started to refuse. Pt tried Boost Breeze at time of visit and enjoys it.   Labs: low potassium, low calcium  Diet Order:  DIET - DYS 1 Room service appropriate?: Yes; Fluid consistency:: Thin  Skin:  Reviewed, no issues  Last BM:  PTA  Height:   Ht Readings from Last 1 Encounters:  03/09/15 4\' 9"  (1.448 m)    Weight:   Wt Readings from Last 1 Encounters:  03/12/15 99 lb 8 oz (45.133 kg)    Ideal Body Weight:  43.2 kg  BMI:  Body mass index is 21.53 kg/(m^2).  Estimated Nutritional Needs:   Kcal:  1350-1550  Protein:  60-70 grams  Fluid:  1.3-1.5 L/day  EDUCATION NEEDS:   No education needs identified at this time   Pryor Ochoa RD, LDN Inpatient Clinical Dietitian Pager: [phone] After Hours Pager: [phone]

## 2015-03-14 LAB — URINE CULTURE: Culture: >=100000

## 2015-03-19 ENCOUNTER — Ambulatory Visit (INDEPENDENT_AMBULATORY_CARE_PROVIDER_SITE_OTHER): Payer: Medicare Other | Admitting: Neurology

## 2015-03-19 ENCOUNTER — Encounter: Payer: Self-pay | Admitting: Neurology

## 2015-03-19 VITALS — BP 159/77 | HR 68 | Ht <= 58 in | Wt 97.0 lb

## 2015-03-19 DIAGNOSIS — R569 Unspecified convulsions: Secondary | ICD-10-CM

## 2015-03-19 DIAGNOSIS — I639 Cerebral infarction, unspecified: Secondary | ICD-10-CM | POA: Diagnosis not present

## 2015-03-19 DIAGNOSIS — F0391 Unspecified dementia with behavioral disturbance: Secondary | ICD-10-CM

## 2015-03-19 DIAGNOSIS — F03918 Unspecified dementia, unspecified severity, with other behavioral disturbance: Secondary | ICD-10-CM

## 2015-03-19 NOTE — Progress Notes (Signed)
Chief Complaint  Patient presents with  . Seizures vs Stroke    She had an episode of loss of consciousness that prompted an ED visit.  She had not missed any anticonvulsant medications.  Her neice and sister are here with her today.  They have noticed changes in speech and balance since this episodes.  She has also been more agitated.     [facility]  PATIENT: Tanya White DOB: Jul 14, 1936   REASON FOR VISIT: Follow-up for history of multiple strokes, complex partial seizure disorder, memory loss  HISTORY FROM: [place] ILLNESS:Tanya White is a 79 years old right-handed African-American female, is accompanied by her Nephew Tanya White for evaluation of worsening functional status, she is currently nursing home resident,    She had a past medical history of stroke in 2012, with residual right-sided weakness, MRI of the brain at that time showed smaller foci of restricted diffusion are seen in the superior right frontal lobe at the level of the centrum semiovale, Multiple infarcts are also seen in the cerebellum, and bilateral frontal lobes. Suggestive of embolic event, Even after that stroke, she lives alone, driving, independent of her living untill June 2015, she suffered acute pancreatitis rupture, with pelvic abscess, she was treated with multiple hospital admission at [facility] prolonged antibiotics,  During that period of time, she also developed complex partial seizure, sounds like partial status, was treated with titrating dose of antiseptic medications, initially with Dilantin, Depakote, later also developed Dilantin toxicity, dizziness, confusion, sleepiness  She was eventually discharged to rehabilitation center at [facility] [place] [facility], in September 2015 [facility] evaluations, Dilantin level was 35, Depakote was less than 10, she was switched over to [place], 500 mg twice a day, Vimpat 150 mg twice a day, she  has no recurrent seizure, gradually recovered, much less drowsy She was noted continue have worsening memory trouble, much worse than her baseline, gait difficulty, She is now taking Eliquis, long term anticoagulation, had left thoracentesis for pleural effusion Most recent CAT scan in 2015 has demonstrated no acute lesions, left parietal area chronic stroke, [facility] Video EEG monitoring: This study is consist with moderate cerebral dysfunction and encephalopathy and ongoing left posterior quadrant cortical irritability. Lateralizing epileptiform discharges may be indicative of an underlying structural abnormality.  UPDATE Jan 27th 2016: She was admitted to [facility] in December first 2015 for recurrent seizure, concurrent with her PEG tube infection, receiving antibiotics, repeat MRI of the brain chronic hemorrhagic infarct left temporoparietal lobe. Mild chronic microvascular ischemic changes in the white matter. No acute abnormality EEG showed mild generalized encephalopathy. There was no seizure or seizure predisposition recorded on this study.Peg tube was taken out in December 2015, she is now higher dose of Vimpat 200 mg twice a day, Keppra 750mg  bid, doing well, no recurrent seizure, continue has profound aphasia, memory trouble, mild gait difficulty, bowel and bladder incontinence, falling sometimes. Update 01/06/2015, Ms. 26, 78 year old female returns for follow-up with her nephew. He says she had a seizure about 2 weeks ago and was given IV Dilantin in the nursing home ordered by the medical director. I see no indication OF that on her MAR. she is currently on Vimpat 200 twice daily and Keppra 750 twice daily. She has history of strokes and profound aphasia, she also has weakness on the right side particularly the right upper extremity. She has significant memory loss. She has bowel and bladder incontinence, he denies  that she is had any falls since last seen. She returns for  reevaluation  UPDATE March 19 2015:  She is accompanied by her sister, and her niece at today's clinical visit, she lives at [facility] home since October 2015,  She was taking Keppra 750 mg twice a day, Vimpat 200 milligrams twice a day  She was admitted to the hospital in March 09 2015 for prolonged seizure, which require intubation, there was also question of patient refusing medications, Keppra level was 37.4    EEG March 09 2015 showed irregular generalized slowing, in the range of  Delta range,  She is now discharged home with Keppra  1000 mg twice a day, Vimpat 200 mg twice a day, Depakote 250 +125 mg twice a day   she is almost back to her baseline, still has significant speech difficulty, mild right side weakness.  There was no recurrent seizure  I have reviewed CAT scan of the brain July 2420 16, encephalomalacia involving left posterior frontal, parietal lobe, no acute lesions,  CT angiogram of the neck showed 72% stenosis of right internal carotid artery, 44% stenosis of left internal carotid artery , Right vertebral artery is dominant, ectatic left vertebral artery, with mild to moderate irregularity   REVIEW OF SYSTEMS: Full 14 system review of systems performed and notable only for those listed, all others are neg:   loss of vision, hearing loss, frequent urination, memory loss, speech difficulty, tremor, agitation, behavior problem, confusion   ALLERGIES: Allergies  Allergen Reactions  . Codeine Shortness Of Breath    Other reaction(s): SHORTNESS OF BREATH   . Lipitor [Atorvastatin] Diarrhea and Other (See Comments)    GI upset, rhabdomyalosis   . Aspirin Diarrhea and Other (See Comments)    Only ASA 325mg  ; pt can tolerate low dose asa 81mg  Reaction : GI upset   . Hctz [Hydrochlorothiazide] Rash  . Hydralazine Other (See Comments)    REACTION: unknown  . Reserpine Other (See Comments)    REACTION: unknown  . Tramadol Other (See Comments)    REACTION:  unknown    HOME MEDICATIONS: Outpatient Prescriptions Prior to Visit  Medication Sig Dispense Refill  . acetaminophen (TYLENOL) 325 MG tablet Take 2 tablets (650 mg total) by mouth every 6 (six) hours as needed for mild pain or fever.    Marland Kitchen albuterol (PROVENTIL) (2.5 MG/3ML) 0.083% nebulizer solution Take 2.5 mg by nebulization every 2 (two) hours as needed for wheezing or shortness of breath.    Marland Kitchen apixaban (ELIQUIS) 5 MG TABS tablet Take 1 tablet (5 mg total) by mouth 2 (two) times daily. 60 tablet   . divalproex (DEPAKOTE SPRINKLE) 125 MG capsule Take 125 mg by mouth 2 (two) times daily.     . famotidine (PEPCID) 20 MG tablet Take 20 mg by mouth 2 (two) times daily.    . feeding supplement, ENSURE COMPLETE, (ENSURE COMPLETE) LIQD Take 237 mLs by mouth 3 (three) times daily between meals.    . ferrous sulfate 325 (65 FE) MG tablet Take 1 tablet (325 mg total) by mouth daily.  3  . furosemide (LASIX) 40 MG tablet Take 1 tablet (40 mg total) by mouth daily as needed (for leg swelling.  Give PRN potassium when Lasix is given). 30 tablet   . gabapentin (NEURONTIN) 100 MG capsule Take 100 mg by mouth 2 (two) times daily.    Marland Kitchen lacosamide (VIMPAT) 200 MG TABS tablet Take 1 tablet (200 mg total) by mouth 2 (two)  times daily. 60 tablet   . levETIRAcetam (KEPPRA) 1000 MG tablet Take 1,000 mg by mouth 2 (two) times daily.    . magnesium oxide (MAG-OX) 400 MG tablet Take 1 tablet (400 mg total) by mouth daily.    . metoprolol tartrate (LOPRESSOR) 12.5 mg TABS tablet Take 1.5 tablets (37.5 mg total) by mouth 2 (two) times daily.    . Multiple Vitamin (MULTIVITAMIN WITH MINERALS) TABS tablet Take 1 tablet by mouth daily.    Marland Kitchen neomycin-bacitracin-polymyxin (NEOSPORIN) OINT Apply 1 application topically 2 (two) times daily. Apply to skin around PEG tube insertion site.    . ondansetron (ZOFRAN) 4 MG tablet Take 1 tablet (4 mg total) by mouth every 4 (four) hours as needed for nausea. 20 tablet 0  . potassium  chloride SA (KLOR-CON M20) 20 MEQ tablet Take 1 tablet (20 mEq total) by mouth daily as needed (whenever she is given Lasix).     No facility-administered medications prior to visit.    PAST MEDICAL HISTORY: Past Medical History  Diagnosis Date  . Seizures   . Hypotension   . Pneumonia   . Stroke   . GI bleed   . Pulmonary embolism   . Gastrostomy tube in place   . Memory loss   . TTP (thrombotic thrombocytopenic purpura)     Treated with PLEX in 2012 at [place]: Past Surgical History  Procedure Laterality Date  . Appendectomy    . Jejunostomy feeding tube      FAMILY HISTORY: Family History  Problem Relation Age of Onset  . High blood pressure Mother   . Cancer    . Stroke      SOCIAL HISTORY: History   Social History  . Marital Status: Single    Spouse Name: N/A  . Number of Children: 2  . Years of Education: N/A   Occupational History  .      Retired   Social History Main Topics  . Smoking status: Former Smoker -- 0.50 packs/day for 15 years  . Smokeless tobacco: Never Used  . Alcohol Use: No  . Drug Use: No  . Sexual Activity: Not on file   Other Topics Concern  . Not on file   Social History Narrative   Patient lives in [place] .   Patient is retired.   Caffeine one cup daily.     PHYSICAL EXAM  Filed Vitals:   03/19/15 1422  BP: 159/77  Pulse: 68  Height: 4\' 9"  (1.448 m)  Weight: 97 lb (43.999 kg)   Body mass index is 20.98 kg/(m^2).   PHYSICAL EXAMNIATION:  Gen: NAD, conversant, well nourised, obese, well groomed                     Cardiovascular: Regular rate rhythm, no peripheral edema, warm, nontender. Eyes: Conjunctivae clear without exudates or hemorrhage Neck: Supple, no carotid bruise. Pulmonary: Clear to auscultation bilaterally   NEUROLOGICAL EXAM:  MENTAL STATUS: Speech  /cognition:  she has significant comprehensive, and expressive aphasia , not cooperative on Mini-Mental  Status examination, has right hemi-neglect  CRANIAL NERVES: CN II:  Right visual field deficit. Pupils are round equal and briskly reactive to light. CN III, IV, VI: extraocular movement are normal. No ptosis. CN V: Facial sensation is intact to pinprick in all 3 divisions bilaterally. Corneal responses are intact.  CN VII: Face is symmetric with normal eye closure and smile. CN VIII: Hearing is  normal to rubbing fingers CN IX, X: Palate elevates symmetrically. Phonation is normal. CN XI: Head turning and shoulder shrug are intact CN XII: Tongue is midline with normal movements and no atrophy.  MOTOR:  mild right arm and leg weakness.  REFLEXES: Reflexes are 2+ and symmetric at the biceps, triceps, knees, and ankles. Plantar responses are flexor.  SENSORY:  right hemi-neglect  COORDINATION: Rapid alternating movements and fine finger movements are intact. There is no dysmetria on finger-to-nose and heel-knee-shin.    GAIT/STANCE:  mildly unsteady dragging her right side   DIAGNOSTIC DATA (LABS, IMAGING, TESTING) - I reviewed patient records, labs, notes, testing and imaging myself where available.  Lab Results  Component Value Date   WBC 9.2 03/10/2015   HGB 12.1 03/10/2015   HCT 33.9* 03/10/2015   MCV 82.1 03/10/2015   PLT 152 03/10/2015      Component Value Date/Time   NA 139 03/12/2015 0527   K 3.4* 03/12/2015 0527   CL 107 03/12/2015 0527   CO2 27 03/12/2015 0527   GLUCOSE 88 03/12/2015 0527   BUN 7 03/12/2015 0527   CREATININE 0.79 03/12/2015 0527   CALCIUM 8.5* 03/12/2015 0527   PROT 7.1 03/09/2015 1450   ALBUMIN 3.7 03/09/2015 1450   AST 20 03/09/2015 1450   ALT 12* 03/09/2015 1450   ALKPHOS 67 03/09/2015 1450   BILITOT 0.7 03/09/2015 1450   GFRNONAA >60 03/12/2015 0527   GFRAA >60 03/12/2015 0527    ASSESSMENT AND PLAN  79 y.o. year old female    Left MCA stroke   she is on  anticoagulation  Eliquis  Complex partial seizure with secondary  generalization   keep Vimpat 200 mg twice a day,  Keppra 1000 mg twice a day   I have suggested titrating up Her dosage,  But family is very reluctant for any medication changes, we will keep her at current Depakote 250 plus 125 mg twice a day   If she continued to do well at next follow-up visit, may consider weaning her off Depakote   Memory loss   Vascular component,  May consider add on Aricept or Waneta Martins, M.D. Ph.D.  [facility] [place], [place] [address] Phone: [phone] Fax:      [phone]

## 2015-04-24 ENCOUNTER — Encounter: Payer: Self-pay | Admitting: Neurology

## 2015-04-24 ENCOUNTER — Ambulatory Visit (INDEPENDENT_AMBULATORY_CARE_PROVIDER_SITE_OTHER): Payer: Medicare Other | Admitting: Neurology

## 2015-04-24 VITALS — BP 182/78 | HR 72 | Ht <= 58 in | Wt 95.0 lb

## 2015-04-24 DIAGNOSIS — R569 Unspecified convulsions: Secondary | ICD-10-CM | POA: Diagnosis not present

## 2015-04-24 DIAGNOSIS — F0391 Unspecified dementia with behavioral disturbance: Secondary | ICD-10-CM

## 2015-04-24 DIAGNOSIS — I639 Cerebral infarction, unspecified: Secondary | ICD-10-CM

## 2015-04-24 DIAGNOSIS — F03918 Unspecified dementia, unspecified severity, with other behavioral disturbance: Secondary | ICD-10-CM

## 2015-04-24 NOTE — Progress Notes (Signed)
Chief Complaint  Patient presents with  . Cerebrovascular Accident    She is here with her nephew, Tanya White, who says she is more combative, especially with the nursing staff when it is time for her medications.  Says she is easily agitated with her family.  . Seizures    She has not had any further seziure-like activity since her last visit here in August.     [facility]  PATIENT: Tanya White DOB: 1936-08-01   REASON FOR VISIT: Follow-up for history of multiple strokes, complex partial seizure disorder, memory loss  HISTORY FROM: [place] ILLNESS:Tanya White is a 79 years old right-handed African-American female, is accompanied by her Nephew Tanya White for evaluation of worsening functional status, she is currently nursing home resident,    She had a past medical history of stroke in 2012, with residual right-sided weakness, MRI of the brain at that time showed smaller foci of restricted diffusion are seen in the superior right frontal lobe at the level of the centrum semiovale, Multiple infarcts are also seen in the cerebellum, and bilateral frontal lobes. Suggestive of embolic event, Even after that stroke, she lives alone, driving, independent of her living untill June 2015, she suffered acute pancreatitis rupture, with pelvic abscess, she was treated with multiple hospital admission at [facility] prolonged antibiotics,  During that period of time, she also developed complex partial seizure, sounds like partial status, was treated with titrating dose of antiseptic medications, initially with Dilantin, Depakote, later also developed Dilantin toxicity, dizziness, confusion, sleepiness  She was eventually discharged to rehabilitation center at [facility], [place] [facility], in September 2015 [place] evaluations, Dilantin level was 35, Depakote was less than 10, she was switched over to Keppra, 500 mg twice a day, Vimpat 150  mg twice a day, she has no recurrent seizure, gradually recovered, much less drowsy She was noted continue have worsening memory trouble, much worse than her baseline, gait difficulty. She is now taking Eliquis, long term anticoagulation, had left thoracentesis for pleural effusion Most recent CAT scan in 2015 has demonstrated no acute lesions, left parietal area chronic stroke, [facility] Video EEG monitoring: This study is consist with moderate cerebral dysfunction and encephalopathy and ongoing left posterior quadrant cortical irritability. Lateralizing epileptiform discharges may be indicative of an underlying structural abnormality.  UPDATE Jan 27th 2016: She was admitted to [facility] in December 1st 2015 for recurrent seizure, concurrent with her PEG tube infection, receiving antibiotics, repeat MRI of the brain chronic hemorrhagic infarct left temporoparietal lobe. Mild chronic microvascular ischemic changes in the white matter. No acute abnormality EEG showed mild generalized encephalopathy. There was no seizure or seizure predisposition recorded on this study.  Peg tube was taken out in December 2015, she is now higher dose of Vimpat 200 mg twice a day, Keppra 750mg  bid, doing well, no recurrent seizure, continue has profound aphasia, memory trouble, mild gait difficulty, bowel and bladder incontinence, falling sometimes. Update 01/06/2015, Ms. 89, 79 year old female returns for follow-up with her nephew. He says she had a seizure about 2 weeks ago and was given IV Dilantin in the nursing home ordered by the medical director. I see no indication OF that on her MAR. she is currently on Vimpat 200 twice daily and Keppra 750 twice daily. She has history of strokes and profound aphasia, she also has weakness on the right side particularly the right upper extremity. She has significant memory loss. She has  bowel and bladder incontinence, he denies that she is had any falls since last  seen. She returns for reevaluation  UPDATE March 19 2015:  She is accompanied by her sister, and her niece at today's clinical visit, she lives at [facility] since October 2015,  She was taking Keppra 750 mg twice a day, Vimpat 200 milligrams twice a day  She was admitted to the hospital in March 09 2015 for prolonged seizure, which require intubation, there was also question of patient refusing medications, Keppra level was 37.4    EEG March 09 2015 showed irregular generalized slowing, in the range of  Delta range,  She is now discharged home with Keppra  1000 mg twice a day, Vimpat 200 mg twice a day, Depakote 250 +125 mg twice a day   she is almost back to her baseline, still has significant speech difficulty, mild right side weakness.  There was no recurrent seizure  I have reviewed CAT scan of the brain July 2420 16, encephalomalacia involving left posterior frontal, parietal lobe, no acute lesions,  CT angiogram of the neck showed 72% stenosis of right internal carotid artery, 44% stenosis of left internal carotid artery , Right vertebral artery is dominant, ectatic left vertebral artery, with mild to moderate irregularity  UPDATE Sept 8th 2016: She lives at nursing home, she can feed, dress herself, but need assistant sometimes,  She sleeps well, she refuse medications, is agitated sometimes   REVIEW OF SYSTEMS: Full 14 system review of systems performed and notable only for those listed, all others are neg:  Behavior problems, confusion, seizure, speech difficulty   ALLERGIES: Allergies  Allergen Reactions  . Codeine Shortness Of Breath    Other reaction(s): SHORTNESS OF BREATH   . Lipitor [Atorvastatin] Diarrhea and Other (See Comments)    GI upset, rhabdomyalosis   . Aspirin Diarrhea and Other (See Comments)    Only ASA 325mg  ; pt can tolerate low dose asa 81mg  Reaction : GI upset   . Hctz [Hydrochlorothiazide] Rash  . Hydralazine Other (See Comments)     REACTION: unknown  . Reserpine Other (See Comments)    REACTION: unknown  . Tramadol Other (See Comments)    REACTION: unknown    HOME MEDICATIONS: Outpatient Prescriptions Prior to Visit  Medication Sig Dispense Refill  . acetaminophen (TYLENOL) 325 MG tablet Take 2 tablets (650 mg total) by mouth every 6 (six) hours as needed for mild pain or fever.    Marland Kitchen albuterol (PROVENTIL) (2.5 MG/3ML) 0.083% nebulizer solution Take 2.5 mg by nebulization every 2 (two) hours as needed for wheezing or shortness of breath.    Marland Kitchen apixaban (ELIQUIS) 5 MG TABS tablet Take 1 tablet (5 mg total) by mouth 2 (two) times daily. 60 tablet   . divalproex (DEPAKOTE) 250 MG DR tablet Take 250 mg by mouth 2 (two) times daily.    . famotidine (PEPCID) 20 MG tablet Take 20 mg by mouth 2 (two) times daily.    . ferrous sulfate 325 (65 FE) MG tablet Take 1 tablet (325 mg total) by mouth daily.  3  . furosemide (LASIX) 40 MG tablet Take 1 tablet (40 mg total) by mouth daily as needed (for leg swelling.  Give PRN potassium when Lasix is given). 30 tablet   . gabapentin (NEURONTIN) 100 MG capsule Take 100 mg by mouth 2 (two) times daily.    Marland Kitchen lacosamide (VIMPAT) 200 MG TABS tablet Take 1 tablet (200 mg total) by mouth 2 (  two) times daily. 60 tablet   . levETIRAcetam (KEPPRA) 1000 MG tablet Take 1,000 mg by mouth 2 (two) times daily.    Marland Kitchen LORazepam (ATIVAN) 1 MG tablet Take 1 mg by mouth. She is using Ativan 1mg  gel q6h prn for agitation/anxiety.    . magnesium oxide (MAG-OX) 400 MG tablet Take 1 tablet (400 mg total) by mouth daily.    . metoprolol tartrate (LOPRESSOR) 25 MG tablet Take 25 mg by mouth. Take 1 and 1/2 twice daily.    . Multiple Vitamin (MULTIVITAMIN WITH MINERALS) TABS tablet Take 1 tablet by mouth daily.    . ondansetron (ZOFRAN) 4 MG tablet Take 1 tablet (4 mg total) by mouth every 4 (four) hours as needed for nausea. 20 tablet 0  . potassium chloride SA (KLOR-CON M20) 20 MEQ tablet Take 1 tablet (20 mEq  total) by mouth daily as needed (whenever she is given Lasix).    . cephALEXin (KEFLEX) 500 MG capsule Take 500 mg by mouth 2 (two) times daily.    . feeding supplement, ENSURE COMPLETE, (ENSURE COMPLETE) LIQD Take 237 mLs by mouth 3 (three) times daily between meals.    Marland Kitchen neomycin-bacitracin-polymyxin (NEOSPORIN) OINT Apply 1 application topically 2 (two) times daily. Apply to skin around PEG tube insertion site.     No facility-administered medications prior to visit.    PAST MEDICAL HISTORY: Past Medical History  Diagnosis Date  . Seizures   . Hypotension   . Pneumonia   . Stroke   . GI bleed   . Pulmonary embolism   . Gastrostomy tube in place   . Memory loss   . TTP (thrombotic thrombocytopenic purpura)     Treated with PLEX in 2012 at [place]: Past Surgical History  Procedure Laterality Date  . Appendectomy    . Jejunostomy feeding tube      FAMILY HISTORY: Family History  Problem Relation Age of Onset  . High blood pressure Mother   . Cancer    . Stroke      SOCIAL HISTORY: Social History   Social History  . Marital Status: Single    Spouse Name: N/A  . Number of Children: 2  . Years of Education: N/A   Occupational History  .      Retired   Social History Main Topics  . Smoking status: Former Smoker -- 0.50 packs/day for 15 years  . Smokeless tobacco: Never Used  . Alcohol Use: No  . Drug Use: No  . Sexual Activity: Not on file   Other Topics Concern  . Not on file   Social History Narrative   Patient lives in [place] .   Patient is retired.   Caffeine one cup daily.     PHYSICAL EXAM  Filed Vitals:   04/24/15 1615  BP: 182/78  Pulse: 72  Height: 4\' 9"  (1.448 m)  Weight: 95 lb (43.092 kg)   Body mass index is 20.55 kg/(m^2).   PHYSICAL EXAMNIATION:  Gen: NAD, conversant, well nourised, obese, well groomed                     Cardiovascular: Regular rate rhythm, no peripheral edema, warm,  nontender. Eyes: Conjunctivae clear without exudates or hemorrhage Neck: Supple, no carotid bruise. Pulmonary: Clear to auscultation bilaterally   NEUROLOGICAL EXAM:  MENTAL STATUS: Speech  /cognition:  she has significant comprehensive, and expressive aphasia , not cooperative on Mini-Mental Status examination, has  right hemi-neglect, right hemi-visual field cut  CRANIAL NERVES: CN II:  Right visual field deficit. Pupils are round equal and briskly reactive to light. CN III, IV, VI: extraocular movement are normal. No ptosis. CN V: Facial sensation is intact to pinprick in all 3 divisions bilaterally. Corneal responses are intact.  CN VII: Face is symmetric with normal eye closure and smile. CN VIII: Hearing is normal to rubbing fingers CN IX, X: Palate elevates symmetrically. Phonation is normal. CN XI: Head turning and shoulder shrug are intact CN XII: Tongue is midline with normal movements and no atrophy.  MOTOR: She tends to hold her right hand in elbow flexion pronation, wrist flexion, finger flexion, only mild weakness in right arm, able to move against gravity without difficulty.  REFLEXES: Reflexes are 2+ and symmetric at the biceps, triceps, knees, and ankles. Plantar responses are flexor.  SENSORY:  right hemi-neglect  COORDINATION: Rapid alternating movements and fine finger movements are intact. There is no dysmetria on finger-to-nose and heel-knee-shin.    GAIT/STANCE:  mildly unsteady dragging her right side   DIAGNOSTIC DATA (LABS, IMAGING, TESTING) - I reviewed patient records, labs, notes, testing and imaging myself where available.   ASSESSMENT AND PLAN  79 y.o. year old female    Left MCA stroke   she is on  anticoagulation  Eliquis  Complex partial seizure with secondary generalization   keep Vimpat 200 mg twice a day,  Keppra 1000 mg twice a day   Depakote 250 plus 125 mg twice a day  Patient refused medications sometimes, recognizing her  pills,  very reluctant for any changes  Will keep her on current dose of medications Dementia  Vascular component  Letter to indicate that she has impaired mental capacity history generated for her family today   Marcial Pacas, M.D. Ph.D.  [facility] [place], [place] [address] Phone: [phone] Fax:      [phone]

## 2015-06-08 ENCOUNTER — Encounter (HOSPITAL_COMMUNITY): Payer: Self-pay | Admitting: Emergency Medicine

## 2015-06-08 ENCOUNTER — Emergency Department (HOSPITAL_COMMUNITY): Payer: Medicare Other

## 2015-06-08 ENCOUNTER — Inpatient Hospital Stay (HOSPITAL_COMMUNITY)
Admission: EM | Admit: 2015-06-08 | Discharge: 2015-06-10 | DRG: 101 | Disposition: A | Discharge status: Skilled Nursing Facility | Payer: Medicare Other | Attending: Family Medicine | Admitting: Family Medicine

## 2015-06-08 DIAGNOSIS — F0390 Unspecified dementia without behavioral disturbance: Secondary | ICD-10-CM | POA: Diagnosis present

## 2015-06-08 DIAGNOSIS — R569 Unspecified convulsions: Secondary | ICD-10-CM | POA: Diagnosis present

## 2015-06-08 DIAGNOSIS — G9340 Encephalopathy, unspecified: Secondary | ICD-10-CM | POA: Diagnosis not present

## 2015-06-08 DIAGNOSIS — Z9114 Patient's other noncompliance with medication regimen: Secondary | ICD-10-CM

## 2015-06-08 DIAGNOSIS — Z888 Allergy status to other drugs, medicaments and biological substances status: Secondary | ICD-10-CM

## 2015-06-08 DIAGNOSIS — Z8673 Personal history of transient ischemic attack (TIA), and cerebral infarction without residual deficits: Secondary | ICD-10-CM | POA: Diagnosis not present

## 2015-06-08 DIAGNOSIS — Z885 Allergy status to narcotic agent status: Secondary | ICD-10-CM | POA: Diagnosis not present

## 2015-06-08 DIAGNOSIS — Z86711 Personal history of pulmonary embolism: Secondary | ICD-10-CM | POA: Diagnosis not present

## 2015-06-08 DIAGNOSIS — Z9119 Patient's noncompliance with other medical treatment and regimen: Secondary | ICD-10-CM | POA: Diagnosis not present

## 2015-06-08 DIAGNOSIS — N390 Urinary tract infection, site not specified: Secondary | ICD-10-CM | POA: Diagnosis present

## 2015-06-08 DIAGNOSIS — Z87891 Personal history of nicotine dependence: Secondary | ICD-10-CM | POA: Diagnosis not present

## 2015-06-08 DIAGNOSIS — Z7901 Long term (current) use of anticoagulants: Secondary | ICD-10-CM | POA: Diagnosis not present

## 2015-06-08 DIAGNOSIS — Z79899 Other long term (current) drug therapy: Secondary | ICD-10-CM

## 2015-06-08 DIAGNOSIS — G40909 Epilepsy, unspecified, not intractable, without status epilepticus: Secondary | ICD-10-CM | POA: Diagnosis present

## 2015-06-08 DIAGNOSIS — D509 Iron deficiency anemia, unspecified: Secondary | ICD-10-CM | POA: Diagnosis present

## 2015-06-08 DIAGNOSIS — Z886 Allergy status to analgesic agent status: Secondary | ICD-10-CM | POA: Diagnosis not present

## 2015-06-08 DIAGNOSIS — R4182 Altered mental status, unspecified: Secondary | ICD-10-CM

## 2015-06-08 DIAGNOSIS — I10 Essential (primary) hypertension: Secondary | ICD-10-CM | POA: Diagnosis present

## 2015-06-08 DIAGNOSIS — I2699 Other pulmonary embolism without acute cor pulmonale: Secondary | ICD-10-CM | POA: Diagnosis present

## 2015-06-08 LAB — CBG MONITORING, ED: GLUCOSE-CAPILLARY: 96 mg/dL (ref 65–99)

## 2015-06-08 LAB — URINE MICROSCOPIC-ADD ON

## 2015-06-08 LAB — URINALYSIS, ROUTINE W REFLEX MICROSCOPIC
Bilirubin Urine: NEGATIVE
GLUCOSE, UA: NEGATIVE mg/dL
Hgb urine dipstick: NEGATIVE
Ketones, ur: NEGATIVE mg/dL
Nitrite: NEGATIVE
PROTEIN: NEGATIVE mg/dL
Specific Gravity, Urine: 1.019 (ref 1.005–1.030)
Urobilinogen, UA: 0.2 mg/dL (ref 0.0–1.0)
pH: 6 (ref 5.0–8.0)

## 2015-06-08 LAB — APTT: aPTT: 29 seconds (ref 24–37)

## 2015-06-08 LAB — COMPREHENSIVE METABOLIC PANEL
ALBUMIN: 3.4 g/dL — AB (ref 3.5–5.0)
ALT: 12 U/L — ABNORMAL LOW (ref 14–54)
AST: 23 U/L (ref 15–41)
Alkaline Phosphatase: 48 U/L (ref 38–126)
Anion gap: 8 (ref 5–15)
BUN: 20 mg/dL (ref 6–20)
CO2: 28 mmol/L (ref 22–32)
Calcium: 8.8 mg/dL — ABNORMAL LOW (ref 8.9–10.3)
Chloride: 102 mmol/L (ref 101–111)
Creatinine, Ser: 1.01 mg/dL — ABNORMAL HIGH (ref 0.44–1.00)
GFR calc Af Amer: >60 mL/min (ref >60)
GFR, EST NON AFRICAN AMERICAN: 52 mL/min — AB (ref >60)
Glucose, Bld: 109 mg/dL — ABNORMAL HIGH (ref 65–99)
POTASSIUM: 4.6 mmol/L (ref 3.5–5.1)
Sodium: 138 mmol/L (ref 135–145)
Total Bilirubin: 0.6 mg/dL (ref 0.3–1.2)
Total Protein: 6.3 g/dL — ABNORMAL LOW (ref 6.5–8.1)

## 2015-06-08 LAB — CBC WITH DIFFERENTIAL/PLATELET
BASOS ABS: 0 10*3/uL (ref 0.0–0.1)
Basophils Relative: 0 %
Eosinophils Absolute: 0 10*3/uL (ref 0.0–0.7)
Eosinophils Relative: 1 %
HEMATOCRIT: 33.1 % — AB (ref 36.0–46.0)
Hemoglobin: 11.7 g/dL — ABNORMAL LOW (ref 12.0–15.0)
Lymphocytes Relative: 17 %
Lymphs Abs: 0.9 10*3/uL (ref 0.7–4.0)
MCH: 29.3 pg (ref 26.0–34.0)
MCHC: 35.3 g/dL (ref 30.0–36.0)
MCV: 83 fL (ref 78.0–100.0)
Monocytes Absolute: 0.3 10*3/uL (ref 0.1–1.0)
Monocytes Relative: 6 %
NEUTROS ABS: 4.1 10*3/uL (ref 1.7–7.7)
Neutrophils Relative %: 76 %
Platelets: 199 10*3/uL (ref 150–400)
RBC: 3.99 MIL/uL (ref 3.87–5.11)
RDW: 14.1 % (ref 11.5–15.5)
WBC: 5.4 10*3/uL (ref 4.0–10.5)

## 2015-06-08 LAB — PROTIME-INR
INR: 1.28 (ref 0.00–1.49)
Prothrombin Time: 16.1 seconds — ABNORMAL HIGH (ref 11.6–15.2)

## 2015-06-08 LAB — I-STAT CHEM 8, ED
BUN: 28 mg/dL — AB (ref 6–20)
Calcium, Ion: 1.19 mmol/L (ref 1.13–1.30)
Chloride: 104 mmol/L (ref 101–111)
Creatinine, Ser: 1.1 mg/dL — ABNORMAL HIGH (ref 0.44–1.00)
Glucose, Bld: 107 mg/dL — ABNORMAL HIGH (ref 65–99)
HEMATOCRIT: 37 % (ref 36.0–46.0)
HEMOGLOBIN: 12.6 g/dL (ref 12.0–15.0)
Potassium: 4.7 mmol/L (ref 3.5–5.1)
SODIUM: 140 mmol/L (ref 135–145)
TCO2: 28 mmol/L (ref 0–100)

## 2015-06-08 LAB — VALPROIC ACID LEVEL: VALPROIC ACID LVL: 35 ug/mL — AB (ref 50.0–100.0)

## 2015-06-08 LAB — I-STAT CG4 LACTIC ACID, ED: LACTIC ACID, VENOUS: 1.23 mmol/L (ref 0.5–2.0)

## 2015-06-08 MED ORDER — VALPROATE SODIUM 500 MG/5ML IV SOLN
250.0000 mg | Freq: Once | INTRAVENOUS | Status: AC
  Administered 2015-06-08: 250 mg via INTRAVENOUS
  Filled 2015-06-08: qty 2.5

## 2015-06-08 MED ORDER — VALPROATE SODIUM 500 MG/5ML IV SOLN: 500.0000 mg | Freq: Once | INTRAVENOUS | Status: DC

## 2015-06-08 MED ORDER — SODIUM CHLORIDE 0.9 % IV SOLN
1000.0000 mg | Freq: Once | INTRAVENOUS | Status: AC
  Administered 2015-06-08: 1000 mg via INTRAVENOUS
  Filled 2015-06-08: qty 10

## 2015-06-08 MED ORDER — SODIUM CHLORIDE 0.9 % IV BOLUS (SEPSIS)
1000.0000 mL | Freq: Once | INTRAVENOUS | Status: AC
  Administered 2015-06-08: 1000 mL via INTRAVENOUS

## 2015-06-08 NOTE — ED Notes (Signed)
Pt here via EMS from [facility]- Pt has been refusing meds since Thursday (including keppra, cipro for UTI, eloquis) and staff reports that she has been altered since then. Pt is oriented to self and place normally. Pt is having garbled speech at this time. EMS/facility staff reported tonic clonic jerking to right side of body only, pt able to follow commands during activity per EMS. Pt received 0.5mg  ativan, 2.5mg  versed PTA. Pt also had ativan gel applied to back of right knee.

## 2015-06-08 NOTE — ED Provider Notes (Signed)
CSN: [id number]     Arrival date & time 06/08/15  1955 History   First MD Initiated Contact with Patient 06/08/15 2010     Chief Complaint  Patient presents with  . Altered Mental Status  . Seizures    LEVEL 5 CAVEAT SECONDARY TO AMS  (Consider location/radiation/quality/duration/timing/severity/associated sxs/prior Treatment) HPI Comments: 79 y/o female with a hx of seizures, hypotension, PNA, CVA, GIB, PE (on chronic eliquis), and dementia presents to the ED for evaluation of AMS. Iatan reported to EMS that patient has been refusing her medications x 4 days, including her Keppra, Eliquis, and Ciprofloxacin which she is to take for UTI. Staff reports that patient has been altered since this time. Patient is alert to self and place at baseline. She has been experiencing garbled speech and staff report tonic clonic jerking to R side of body on arrival. EMS reports that patient was able to follow commands on their arrival. Patient given 0.5mg  ativan and 2.5mg  Versed PTA. Patient also had ativan gel applied to the back of her R knee.  Patient is FULL CODE PCP - Dr. Noberto Retort  Patient is a 79 y.o. female presenting with altered mental status and seizures. The history is provided by the EMS personnel and the nursing home. No language interpreter was used.  Altered Mental Status Associated symptoms: seizures   Seizures   Past Medical History  Diagnosis Date  . Seizures ([place])   . Hypotension   . Pneumonia   . Stroke ([place])   . GI bleed   . Pulmonary embolism ([place])   . Gastrostomy tube in place [facility])   . Memory loss   . TTP (thrombotic thrombocytopenic purpura) ([facility])     Treated with PLEX in 2012 at [facility]   Past Surgical History  Procedure Laterality Date  . Appendectomy    . Jejunostomy feeding tube     Family History  Problem Relation Age of Onset  . High blood pressure Mother   . Cancer    . Stroke     Social History  Substance Use Topics  . Smoking status: Former  Smoker -- 0.50 packs/day for 15 years  . Smokeless tobacco: Never Used  . Alcohol Use: No   OB History    No data available      Review of Systems  Unable to perform ROS: Mental status change  Neurological: Positive for seizures.    Allergies  Codeine; Lipitor; Aspirin; Hctz; Hydralazine; Reserpine; and Tramadol  Home Medications   Prior to Admission medications   Medication Sig Start Date End Date Taking? Authorizing Provider  acetaminophen (TYLENOL) 325 MG tablet Take 2 tablets (650 mg total) by mouth every 6 (six) hours as needed for mild pain or fever. 07/22/14  Yes Bonnielee Haff, MD  albuterol (PROVENTIL) (2.5 MG/3ML) 0.083% nebulizer solution Take 2.5 mg by nebulization every 2 (two) hours as needed for wheezing or shortness of breath.   Yes Historical Provider, MD  apixaban (ELIQUIS) 5 MG TABS tablet Take 1 tablet (5 mg total) by mouth 2 (two) times daily. 07/22/14  Yes Bonnielee Haff, MD  ciprofloxacin (CIPRO) 500 MG tablet Take 500 mg by mouth 2 (two) times daily. 7 day course started 06/04/15   Yes Historical Provider, MD  Cranberry 250 MG TABS Take 250 mg by mouth daily.   Yes Historical Provider, MD  divalproex (DEPAKOTE) 250 MG DR tablet Take 250 mg by mouth 2 (two) times daily.   Yes Historical Provider, MD  famotidine (  PEPCID) 20 MG tablet Take 20 mg by mouth daily.    Yes Historical Provider, MD  ferrous sulfate 325 (65 FE) MG tablet Take 1 tablet (325 mg total) by mouth daily. 07/22/14  Yes Bonnielee Haff, MD  furosemide (LASIX) 40 MG tablet Take 1 tablet (40 mg total) by mouth daily as needed (for leg swelling.  Give PRN potassium when Lasix is given). 07/22/14  Yes Bonnielee Haff, MD  lacosamide (VIMPAT) 200 MG TABS tablet Take 1 tablet (200 mg total) by mouth 2 (two) times daily. 07/22/14  Yes Bonnielee Haff, MD  levETIRAcetam (KEPPRA) 1000 MG tablet Take 1,000 mg by mouth 2 (two) times daily.   Yes Historical Provider, MD  LORazepam (ATIVAN) 2 MG/ML injection Inject 1  mg into the muscle every 6 (six) hours as needed for seizure.   Yes Historical Provider, MD  metoprolol tartrate (LOPRESSOR) 25 MG tablet Take 37.5 mg by mouth 2 (two) times daily.    Yes Historical Provider, MD  ondansetron (ZOFRAN) 4 MG tablet Take 1 tablet (4 mg total) by mouth every 4 (four) hours as needed for nausea. 07/22/14  Yes Bonnielee Haff, MD  potassium chloride SA (KLOR-CON M20) 20 MEQ tablet Take 1 tablet (20 mEq total) by mouth daily as needed (whenever she is given Lasix). Patient taking differently: Take 20 mEq by mouth daily as needed (whenever lasix is administered).  07/22/14  Yes Bonnielee Haff, MD  PRESCRIPTION MEDICATION Apply 1 mg topically every 6 (six) hours as needed (agitation/anxiety). Ativan Gel 1 mg   Yes Historical Provider, MD  magnesium oxide (MAG-OX) 400 MG tablet Take 1 tablet (400 mg total) by mouth daily. Patient not taking: Reported on 06/08/2015 03/12/15   Marijean Heath, NP  trimethoprim (TRIMPEX) 100 MG tablet Take 100 mg by mouth daily. Continuous course    Historical Provider, MD   BP 139/94 mmHg  Pulse 86  Temp(Src) 97.8 F (36.6 C) (Oral)  Resp 26  Ht 4\' 9"  (1.448 m)  Wt 94 lb 12.8 oz (43 kg)  BMI 20.51 kg/m2  SpO2 100%   Physical Exam  Constitutional: She appears well-developed and well-nourished.  Chronically frail appearing.  HENT:  Head: Normocephalic and atraumatic.  Eyes: Conjunctivae are normal.  Neck: Normal range of motion.  Cardiovascular: Normal rate, regular rhythm and intact distal pulses.   Pulmonary/Chest: Effort normal. No respiratory distress. She has no wheezes. She has no rales.  Respirations even and unlabored. SpO2 100% on 2L nasal cannula.  Abdominal: Soft. She exhibits no distension. There is no tenderness.  Soft, nontender. No distension or masses noted.  Neurological: She is alert.  Patient is alert. She opens her eyes to her name and verbal stimuli. Question mild drooping to L corner of mouth. Eyebrow  movements symmetric. Patient with 5/5 grips on the left. Right grips 3/5. Weakness noted to the RUE, 3/5 (chronic from old CVA?). Spontaneous twitching noted to RLE. Speech is garbled; nonsensical. Patient will not follow commands, therefore, difficult to complete assessment.  Skin: Skin is warm and dry. No rash noted. She is not diaphoretic. No erythema. No pallor.  Nursing note and vitals reviewed.   ED Course  Procedures (including critical care time) Labs Review Labs Reviewed  CBC WITH DIFFERENTIAL/PLATELET - Abnormal; Notable for the following:    Hemoglobin 11.7 (*)    HCT 33.1 (*)    All other components within normal limits  COMPREHENSIVE METABOLIC PANEL - Abnormal; Notable for the following:    Glucose, Bld  109 (*)    Creatinine, Ser 1.01 (*)    Calcium 8.8 (*)    Total Protein 6.3 (*)    Albumin 3.4 (*)    ALT 12 (*)    GFR calc non Af Amer 52 (*)    All other components within normal limits  URINALYSIS, ROUTINE W REFLEX MICROSCOPIC (NOT AT [facility]) - Abnormal; Notable for the following:    Leukocytes, UA SMALL (*)    All other components within normal limits  VALPROIC ACID LEVEL - Abnormal; Notable for the following:    Valproic Acid Lvl 35 (*)    All other components within normal limits  PROTIME-INR - Abnormal; Notable for the following:    Prothrombin Time 16.1 (*)    All other components within normal limits  I-STAT CHEM 8, ED - Abnormal; Notable for the following:    BUN 28 (*)    Creatinine, Ser 1.10 (*)    Glucose, Bld 107 (*)    All other components within normal limits  URINE CULTURE  APTT  URINE MICROSCOPIC-ADD ON  LEVETIRACETAM LEVEL  CBG MONITORING, ED  I-STAT CG4 LACTIC ACID, ED    Imaging Review Ct Head Wo Contrast  06/08/2015  CLINICAL DATA:  79 year old female with altered mental status today. EXAM: CT HEAD WITHOUT CONTRAST TECHNIQUE: Contiguous axial images were obtained from the base of the skull through the vertex without intravenous contrast.  COMPARISON:  Head CT 03/09/2015. FINDINGS: Large area of low attenuation in the left parietal region, similar to prior examinations, compatible with encephalomalacia from remote left posterior MCA territory infarct. Patchy and confluent areas of decreased attenuation are noted throughout the deep and periventricular white matter of the cerebral hemispheres bilaterally, compatible with chronic microvascular ischemic disease. Physiologic calcifications are again noted in the basal ganglia bilaterally. No acute intracranial abnormalities. Specifically, no evidence of acute intracranial hemorrhage, no definite findings of acute/subacute cerebral ischemia, no mass, mass effect, hydrocephalus or abnormal intra or extra-axial fluid collections. Visualized paranasal sinuses and mastoids are well pneumatized. No acute displaced skull fractures are identified. IMPRESSION: 1. No acute intracranial abnormalities. 2. Sequela of old left MCA territory infarct and extensive chronic microvascular ischemic changes in cerebral white matter redemonstrated, as above. Electronically Signed   By: Vinnie Langton M.D.   On: 06/08/2015 21:42   Dg Chest Port 1 View  06/08/2015  CLINICAL DATA:  Altered mental status. EXAM: PORTABLE CHEST 1 VIEW COMPARISON:  03/10/2015 and 03/09/2015 and 07/17/2014 FINDINGS: Heart size and pulmonary vascularity are normal and the lungs are clear. Tortuosity and calcification of the thoracic aorta. IMPRESSION: No acute abnormality.  Aortic atherosclerosis. Electronically Signed   By: Lorriane Shire M.D.   On: 06/08/2015 20:41     I have personally reviewed and evaluated these images and lab results as part of my medical decision-making.   EKG Interpretation   Date/Time:  Sunday June 08 2015 19:56:58 EDT Ventricular Rate:  90 PR Interval:  143 QRS Duration: 92 QT Interval:  355 QTC Calculation: 434 R Axis:   -49 Text Interpretation:  Sinus rhythm Left axis deviation No significant   change since last tracing Confirmed by KNAPP  MD-J, JON ([id number]) on  06/08/2015 8:40:23 PM      MDM   Final diagnoses:  Altered mental status, unspecified altered mental status type  Hx of medication noncompliance  Seizure-like activity ([place])    79 year old female presents to the emergency department for altered mental status. She will be admitted for further monitoring.  No seizure activity witnessed in the emergency department. No new neurologic deficits to suggest acute stroke. CT negative for any acute CVA. Urine does not suggest infection. Urine culture was sent for testing. Laboratory workup is otherwise noncontributory. TRH to admit to stepdown.   Filed Vitals:   06/08/15 2300 06/08/15 2315 06/08/15 2356 06/09/15 0000  BP: 92/61 135/81  139/94  Pulse:  119  86  Temp:    97.8 F (36.6 C)  TempSrc:    Oral  Resp: 18 16  26   Height:   4\' 9"  (1.448 m)   Weight:   94 lb 12.8 oz (43 kg)   SpO2:  100%  100%       Antonietta Breach, PA-C 06/09/15 0019  Dorie Rank, MD 06/11/15 [phone]

## 2015-06-09 ENCOUNTER — Inpatient Hospital Stay (HOSPITAL_COMMUNITY): Payer: Medicare Other

## 2015-06-09 ENCOUNTER — Encounter (HOSPITAL_COMMUNITY): Payer: Self-pay | Admitting: Internal Medicine

## 2015-06-09 DIAGNOSIS — N390 Urinary tract infection, site not specified: Secondary | ICD-10-CM

## 2015-06-09 DIAGNOSIS — R569 Unspecified convulsions: Secondary | ICD-10-CM

## 2015-06-09 LAB — COMPREHENSIVE METABOLIC PANEL
ALK PHOS: 49 U/L (ref 38–126)
ALT: 12 U/L — AB (ref 14–54)
AST: 22 U/L (ref 15–41)
Albumin: 3.5 g/dL (ref 3.5–5.0)
Anion gap: 7 (ref 5–15)
BILIRUBIN TOTAL: 0.5 mg/dL (ref 0.3–1.2)
BUN: 16 mg/dL (ref 6–20)
CALCIUM: 8.7 mg/dL — AB (ref 8.9–10.3)
CO2: 26 mmol/L (ref 22–32)
CREATININE: 0.91 mg/dL (ref 0.44–1.00)
Chloride: 103 mmol/L (ref 101–111)
GFR, EST NON AFRICAN AMERICAN: 59 mL/min — AB (ref >60)
Glucose, Bld: 111 mg/dL — ABNORMAL HIGH (ref 65–99)
Potassium: 4.5 mmol/L (ref 3.5–5.1)
Sodium: 136 mmol/L (ref 135–145)
Total Protein: 6.4 g/dL — ABNORMAL LOW (ref 6.5–8.1)

## 2015-06-09 LAB — HEPARIN LEVEL (UNFRACTIONATED)
Heparin Unfractionated: 0.16 IU/mL — ABNORMAL LOW (ref 0.30–0.70)
Heparin Unfractionated: 1.08 IU/mL — ABNORMAL HIGH (ref 0.30–0.70)

## 2015-06-09 LAB — APTT

## 2015-06-09 LAB — CBC
HCT: 34.7 % — ABNORMAL LOW (ref 36.0–46.0)
Hemoglobin: 12.2 g/dL (ref 12.0–15.0)
MCH: 29.1 pg (ref 26.0–34.0)
MCHC: 35.2 g/dL (ref 30.0–36.0)
MCV: 82.8 fL (ref 78.0–100.0)
PLATELETS: 177 10*3/uL (ref 150–400)
RBC: 4.19 MIL/uL (ref 3.87–5.11)
RDW: 14.1 % (ref 11.5–15.5)
WBC: 5.7 10*3/uL (ref 4.0–10.5)

## 2015-06-09 LAB — MRSA PCR SCREENING: MRSA BY PCR: POSITIVE — AB

## 2015-06-09 LAB — AMMONIA: Ammonia: 21 umol/L (ref 9–35)

## 2015-06-09 MED ORDER — MUPIROCIN 2 % EX OINT
1.0000 "application " | TOPICAL_OINTMENT | Freq: Two times a day (BID) | CUTANEOUS | Status: DC
  Administered 2015-06-09 – 2015-06-10 (×2): 1 via NASAL
  Filled 2015-06-09 (×2): qty 22

## 2015-06-09 MED ORDER — DEXTROSE 5 % IV SOLN
1.0000 g | Freq: Every day | INTRAVENOUS | Status: DC
  Administered 2015-06-09 – 2015-06-10 (×2): 1 g via INTRAVENOUS
  Filled 2015-06-09 (×4): qty 10

## 2015-06-09 MED ORDER — DIVALPROEX SODIUM 250 MG PO DR TAB
250.0000 mg | DELAYED_RELEASE_TABLET | Freq: Two times a day (BID) | ORAL | Status: DC
  Administered 2015-06-09 – 2015-06-10 (×2): 250 mg via ORAL
  Filled 2015-06-09 (×5): qty 1

## 2015-06-09 MED ORDER — LORAZEPAM 2 MG/ML IJ SOLN
0.5000 mg | Freq: Once | INTRAMUSCULAR | Status: AC
  Administered 2015-06-09: 0.5 mg via INTRAVENOUS
  Filled 2015-06-09: qty 1

## 2015-06-09 MED ORDER — HEPARIN (PORCINE) IN NACL 100-0.45 UNIT/ML-% IJ SOLN
700.0000 [IU]/h | INTRAMUSCULAR | Status: DC
  Administered 2015-06-09: 700 [IU]/h via INTRAVENOUS
  Filled 2015-06-09: qty 250

## 2015-06-09 MED ORDER — HALOPERIDOL LACTATE 5 MG/ML IJ SOLN: 0.5000 mg | Freq: Four times a day (QID) | INTRAMUSCULAR | Status: DC | PRN

## 2015-06-09 MED ORDER — LACOSAMIDE 50 MG PO TABS
200.0000 mg | ORAL_TABLET | Freq: Two times a day (BID) | ORAL | Status: DC
  Administered 2015-06-09 – 2015-06-10 (×2): 200 mg via ORAL
  Filled 2015-06-09 (×2): qty 4

## 2015-06-09 MED ORDER — METOPROLOL TARTRATE 1 MG/ML IV SOLN
2.5000 mg | Freq: Three times a day (TID) | INTRAVENOUS | Status: DC
  Administered 2015-06-09 (×4): 2.5 mg via INTRAVENOUS
  Filled 2015-06-09 (×5): qty 5

## 2015-06-09 MED ORDER — CHLORHEXIDINE GLUCONATE CLOTH 2 % EX PADS
6.0000 | MEDICATED_PAD | Freq: Every day | CUTANEOUS | Status: DC
  Administered 2015-06-09 – 2015-06-10 (×2): 6 via TOPICAL

## 2015-06-09 MED ORDER — APIXABAN 2.5 MG PO TABS
2.5000 mg | ORAL_TABLET | Freq: Two times a day (BID) | ORAL | Status: DC
  Administered 2015-06-09 – 2015-06-10 (×2): 2.5 mg via ORAL
  Filled 2015-06-09 (×2): qty 1

## 2015-06-09 MED ORDER — SODIUM CHLORIDE 0.9 % IV SOLN
200.0000 mg | Freq: Two times a day (BID) | INTRAVENOUS | Status: DC
  Administered 2015-06-09 (×2): 200 mg via INTRAVENOUS
  Filled 2015-06-09 (×4): qty 20

## 2015-06-09 MED ORDER — LEVETIRACETAM 500 MG PO TABS
1000.0000 mg | ORAL_TABLET | Freq: Two times a day (BID) | ORAL | Status: DC
  Administered 2015-06-09 – 2015-06-10 (×2): 1000 mg via ORAL
  Filled 2015-06-09 (×2): qty 2

## 2015-06-09 MED ORDER — SODIUM CHLORIDE 0.9 % IV SOLN
INTRAVENOUS | Status: DC
  Administered 2015-06-09: via INTRAVENOUS

## 2015-06-09 MED ORDER — SODIUM CHLORIDE 0.9 % IV SOLN
1000.0000 mg | Freq: Two times a day (BID) | INTRAVENOUS | Status: DC
  Administered 2015-06-09: 1000 mg via INTRAVENOUS
  Filled 2015-06-09 (×3): qty 10

## 2015-06-09 MED ORDER — HEPARIN (PORCINE) IN NACL 100-0.45 UNIT/ML-% IJ SOLN: 550.0000 [IU]/h | INTRAMUSCULAR | Status: DC

## 2015-06-09 MED ORDER — ALBUTEROL SULFATE (2.5 MG/3ML) 0.083% IN NEBU: 2.5000 mg | INHALATION_SOLUTION | RESPIRATORY_TRACT | Status: DC | PRN

## 2015-06-09 MED ORDER — HEPARIN BOLUS VIA INFUSION
2000.0000 [IU] | Freq: Once | INTRAVENOUS | Status: AC
  Administered 2015-06-09: 2000 [IU] via INTRAVENOUS
  Filled 2015-06-09: qty 2000

## 2015-06-09 MED ORDER — FERROUS SULFATE 325 (65 FE) MG PO TABS
325.0000 mg | ORAL_TABLET | Freq: Every day | ORAL | Status: DC
  Administered 2015-06-09 – 2015-06-10 (×2): 325 mg via ORAL
  Filled 2015-06-09 (×2): qty 1

## 2015-06-09 NOTE — Evaluation (Signed)
Clinical/Bedside Swallow Evaluation Patient Details  Name: Tanya White MRN: [id number] Date of Birth: September 18, 1935  Today's Date: 06/09/2015 Time: SLP Start Time (ACUTE ONLY): 75 SLP Stop Time (ACUTE ONLY): 1018 SLP Time Calculation (min) (ACUTE ONLY): 13 min  Past Medical History:  Past Medical History  Diagnosis Date  . Seizures ([place])   . Hypotension   . Pneumonia   . Stroke ([place])   . GI bleed   . Pulmonary embolism ([place])   . Gastrostomy tube in place [facility])   . Memory loss   . TTP (thrombotic thrombocytopenic purpura) (HCC)     Treated with PLEX in 2012 at [facility]   Past Surgical History:  Past Surgical History  Procedure Laterality Date  . Appendectomy    . Jejunostomy feeding tube     HPI:  Tanya White is a 79 y.o. femalewith history of seizure, CVA, PE, and dysphagia who was brought to the ER for possible tonic-clonic seizure-like episode. Pt is aphasic/apraxic at baseline, with previous need for PEG due to a cognitively-based dysphagia with concern for adequate nutritional intake despite recommendation for regular diet and thin liquids.   Assessment / Plan / Recommendation Clinical Impression  Pt has a baseline aphasia and apraxia, although it is unclear how current mentation may be altered from baseline. Pt requires Mod-Max cues for redirection and initiation for self-fed trials. She has an immediate throat clear with initial sip of thin liquids but overall appears to tolerate them well. Mastication is very mildly prolonged, however immediate throat clearing x1 may be indicative of reduce oral containment given current mentation. Will initiate Dys 3 diet and thin liquids and provide SLP f/u for tolerance.    Aspiration Risk  Mild    Diet Recommendation Dysphagia 3 (Mech soft);Thin   Medication Administration: Whole meds with puree Compensations: Minimize environmental distractions;Slow rate;Small sips/bites    Other  Recommendations Oral Care Recommendations:  Oral care BID   Follow Up Recommendations   tba    Frequency and Duration min 2x/week  1 week   Pertinent Vitals/Pain n/a    SLP Swallow Goals     Swallow Study Prior Functional Status       General Other Pertinent Information: Tanya White is a 79 y.o. femalewith history of seizure, CVA, PE, and dysphagia who was brought to the ER for possible tonic-clonic seizure-like episode. Pt is aphasic/apraxic at baseline, with previous need for PEG due to a cognitively-based dysphagia with concern for adequate nutritional intake despite recommendation for regular diet and thin liquids. Type of Study: Bedside swallow evaluation Previous Swallow Assessment: previous BSEs recommending regular diet and thin liquids but with poor intake due to cognition Diet Prior to this Study: NPO Temperature Spikes Noted: No Respiratory Status: Room air History of Recent Intubation: No Behavior/Cognition: Alert;Requires cueing Self-Feeding Abilities: Able to feed self;Needs assist Patient Positioning: Upright in bed Baseline Vocal Quality: Normal    Oral/Motor/Sensory Function     Ice Chips Ice chips: Not tested   Thin Liquid Thin Liquid: Impaired Presentation: Self Fed;Straw Pharyngeal  Phase Impairments: Throat Clearing - Immediate    Nectar Thick Nectar Thick Liquid: Not tested   Honey Thick Honey Thick Liquid: Not tested   Puree Puree: Not tested (pt declined)   Solid    Solid: Impaired Presentation: Self Fed Pharyngeal Phase Impairments: Throat Clearing - Immediate      Germain Osgood, M.A. CCC-SLP [phone]  Germain Osgood 06/09/2015,10:59 AM

## 2015-06-09 NOTE — Procedures (Signed)
ELECTROENCEPHALOGRAM REPORT   Patient: Tanya White        Age: 79 y.o.        Sex: female Referring Physician: Dr Clementeen Graham Report Date:  06/09/2015        Interpreting Physician: Hulen Luster  History: Tanya White is an 79 y.o. female admitted with altered mental status  Medications:  Scheduled: . apixaban  2.5 mg Oral BID  . cefTRIAXone (ROCEPHIN)  IV  1 g Intravenous QHS  . Chlorhexidine Gluconate Cloth  6 each Topical Q0600  . divalproex  250 mg Oral BID  . ferrous sulfate  325 mg Oral Daily  . lacosamide  200 mg Oral BID  . levETIRAcetam  1,000 mg Oral BID  . metoprolol  2.5 mg Intravenous 3 times per day  . mupirocin ointment  1 application Nasal BID    Conditions of Recording:  This is a 16 channel EEG carried out with the patient in the drowsy state.  Description:  The waking background activity consists of a low voltage, symmetrical, fairly well organized, theta activity in the 5 to 7 Hz range. No focal slowing or epileptiform activity noted.   Hyperventilation was not performed. Intermittent photic stimulation was not performed.  IMPRESSION: Abnormal EEG due to the presence of generalized slowing indicating a mild to moderate cerebral disturbance (encephalopathy). No epileptiform activity noted.    Jim Like, DO [facility] [phone]  If 7pm- 7am, please page neurology on call as listed in [place]. 06/09/2015, 3:22 PM

## 2015-06-09 NOTE — Progress Notes (Addendum)
3ANTICOAGULATION CONSULT NOTE - Follow Up Consult  Pharmacy Consult for heparin Indication: hx PE, bridge tx for Eliquis  Allergies  Allergen Reactions  . Codeine Shortness Of Breath  . Lipitor [Atorvastatin] Diarrhea and Other (See Comments)    GI upset, rhabdomyalosis   . Aspirin Diarrhea and Other (See Comments)    Only ASA 325mg  ; pt can tolerate low dose asa 81mg  Reaction : GI upset   . Hctz [Hydrochlorothiazide] Rash  . Hydralazine Other (See Comments)    Unknown reaction - listed on [facility] 06/08/2015  . Reserpine Other (See Comments)    Unknown reaction - listed on [facility] 06/08/2015  . Tramadol Other (See Comments)    Unknown reaction - listed on [facility] 06/08/2015    Patient Measurements: Height: 4\' 9"  (144.8 cm) Weight: 94 lb 12.8 oz (43 kg) IBW/kg (Calculated) : 38.6   Vital Signs: Temp: 97.8 F (36.6 C) (10/24 0900) Temp Source: Oral (10/24 0900) BP: 102/46 mmHg (10/24 0400) Pulse Rate: 63 (10/24 0400)  Labs:  Recent Labs  06/08/15 2042 06/08/15 2055 06/09/15 0042 06/09/15 0917  HGB 11.7* 12.6 12.2  --   HCT 33.1* 37.0 34.7*  --   PLT 199  --  177  --   APTT 29  --   --  >200*  LABPROT 16.1*  --   --   --   INR 1.28  --   --   --   HEPARINUNFRC  --   --  0.16* 1.08*  CREATININE 1.01* 1.10* 0.91  --     Estimated Creatinine Clearance: 31.5 mL/min (by C-G formula based on Cr of 0.91).    Assessment: 79 yo F on heparin bridge per pharmacy.  On Eliquis PTA for hx PE.  Baseline HL 0.16 - Eliquis still slightly affecting HL.   First aPTT drawn after heparin bolus 2000 units and heparin drip at 700 units/hr > 200 and HL 1.08.  RN thinks labs drawn correctly.  No bleeding reported.  CBC stable.  APTT above goal.    Goal of Therapy:  Heparin level 0.3-0.7 units/ml aPTT 66-102  seconds Monitor platelets by anticoagulation protocol: Yes   Plan:  ? Transition back to eliquis now that passed swallow eval? -hold heparin drip x 1 hour -resume at 550 units/hr -  check 8 hr aPTT - daily aPTT/HL/CBC while on heparin  Eudelia Bunch, Pharm.D. [phone] 06/09/2015 11:07 AM   Addendum: To transition back to Eliquis for PE long-term prevention. per admit note on chronic eliquis; per Med rec 5 bid since 07/22/14 so has had at least 6 months of full dose, will decrease to "reduction in the risk of recurrence" 2.5 mg bid after at least 6 months of rx for PE.  Plan: start lower dose of Eliquis 2.5 mg bid tonight  Eudelia Bunch, Pharm.D. [phone] 06/09/2015 12:14 PM

## 2015-06-09 NOTE — Progress Notes (Signed)
[facility] PROGRESS NOTE  Tanya White KGM:[id number] DOB: [birth year] DOA: 06/08/2015 PCP: [name], MD  Please refer to admission H&P from earlier this morning for details, in brief, 79 year old female with history of seizures, CVA with mild to moderate dementia, history of PE on  Eliquis, resident of [facility] brought to the ED after found to have a tonic-clonic seizure like episode. Patient found to be acutely encephalopathy on admission. As per her nephew she was increasingly confused over the past 2-3 days, was diagnosed of UTI and placed on Cipro at the facility. She has been frequently noncompliant with her her antiseizure medications. Patient admitted to stepdown unit.  Assessment/Plan: Acute encephalopathy Postictal state + UTI. Patient still quite confused and occasionally agitated. Cleared swallowing eval today. Resumed her home antiepileptics. (Vimpat, Depakote and Keppra).  MRI of the brain was chronic changes only (old left MCA territory infarct). EEG without epileptiform activity and shows generalized slowing suggestive of encephalopathy. -Continue neuro checks. Continue empiric antibiotics for UTI (IV Rocephin) -When necessary low dose Haldol for agitation.  Essential hypertension Resume home medications.  History of PE On Eliquis which is resumed.  Iron deficiency anemia On iron supplements.   Diet: Dysphagia level III  DVT prophylaxis: On anticoagulation  Code Status: Full code Family Communication: Discussed with nephew on the phone Disposition Plan: Return to skilled nursing facility once mental status improves. PT evaluation ordered.   Consultants:  None  Procedures:  Head CT  MRI brain  EEG    Antibiotics:  IV Rocephin  HPI/Subjective: Seen and examined. Still very confused. Hemodynamically stable and transferred to telemetry  Objective: Filed Vitals:   06/09/15 1519  BP: 143/82  Pulse: 71  Temp: 97.5 F (36.4 C)   Resp: 16    Intake/Output Summary (Last 24 hours) at 06/09/15 1638 Last data filed at 06/09/15 1200  Gross per 24 hour  Intake 626.81 ml  Output      0 ml  Net 626.81 ml   Filed Weights   06/08/15 2356 06/09/15 1249  Weight: 43 kg (94 lb 12.8 oz) 43.2 kg (95 lb 3.8 oz)    Exam:   General:  Elderly female not in distress, confused  HEENT: No pallor, moist oral mucosa, supple neck  Chest: Clear to auscultation bilaterally    CVS: Normal S1 and S2, no murmurs  GI: Soft, nondistended, nontender, bowel sounds present  Musculoskeletal: Warm, no edema  CNS: Awake and alert but very confused   Data Reviewed: Basic Metabolic Panel:  Recent Labs Lab 06/08/15 2042 06/08/15 2055 06/09/15 0042  NA 138 140 136  K 4.6 4.7 4.5  CL 102 104 103  CO2 28  --  26  GLUCOSE 109* 107* 111*  BUN 20 28* 16  CREATININE 1.01* 1.10* 0.91  CALCIUM 8.8*  --  8.7*   Liver Function Tests:  Recent Labs Lab 06/08/15 2042 06/09/15 0042  AST 23 22  ALT 12* 12*  ALKPHOS 48 49  BILITOT 0.6 0.5  PROT 6.3* 6.4*  ALBUMIN 3.4* 3.5   No results for input(s): LIPASE, AMYLASE in the last 168 hours.  Recent Labs Lab 06/09/15 0042  AMMONIA 21   CBC:  Recent Labs Lab 06/08/15 2042 06/08/15 2055 06/09/15 0042  WBC 5.4  --  5.7  NEUTROABS 4.1  --   --   HGB 11.7* 12.6 12.2  HCT 33.1* 37.0 34.7*  MCV 83.0  --  82.8  PLT 199  --  177   Cardiac Enzymes:  No results for input(s): CKTOTAL, CKMB, CKMBINDEX, TROPONINI in the last 168 hours. BNP (last 3 results) No results for input(s): BNP in the last 8760 hours.  ProBNP (last 3 results) No results for input(s): PROBNP in the last 8760 hours.  CBG:  Recent Labs Lab 06/08/15 2052  GLUCAP 96    Recent Results (from the past 240 hour(s))  MRSA PCR Screening     Status: Abnormal   Collection Time: 06/09/15 12:42 AM  Result Value Ref Range Status   MRSA by PCR POSITIVE (A) NEGATIVE Final    Comment:        The GeneXpert  MRSA Assay (FDA approved for NASAL specimens only), is one component of a comprehensive MRSA colonization surveillance program. It is not intended to diagnose MRSA infection nor to guide or monitor treatment for MRSA infections. RESULT CALLED TO, READ BACK BY AND VERIFIED WITH: REAP,B RN 0401 06/09/15 MITCHELL,L      Studies: Ct Head Wo Contrast  06/08/2015  CLINICAL DATA:  79 year old female with altered mental status today. EXAM: CT HEAD WITHOUT CONTRAST TECHNIQUE: Contiguous axial images were obtained from the base of the skull through the vertex without intravenous contrast. COMPARISON:  Head CT 03/09/2015. FINDINGS: Large area of low attenuation in the left parietal region, similar to prior examinations, compatible with encephalomalacia from remote left posterior MCA territory infarct. Patchy and confluent areas of decreased attenuation are noted throughout the deep and periventricular white matter of the cerebral hemispheres bilaterally, compatible with chronic microvascular ischemic disease. Physiologic calcifications are again noted in the basal ganglia bilaterally. No acute intracranial abnormalities. Specifically, no evidence of acute intracranial hemorrhage, no definite findings of acute/subacute cerebral ischemia, no mass, mass effect, hydrocephalus or abnormal intra or extra-axial fluid collections. Visualized paranasal sinuses and mastoids are well pneumatized. No acute displaced skull fractures are identified. IMPRESSION: 1. No acute intracranial abnormalities. 2. Sequela of old left MCA territory infarct and extensive chronic microvascular ischemic changes in cerebral white matter redemonstrated, as above. Electronically Signed   By: Vinnie Langton M.D.   On: 06/08/2015 21:42   Mr Brain Wo Contrast  06/09/2015  CLINICAL DATA:  Altered mental status, refusing medication for 4 days at nursing home. History of seizures, hypertension, pneumonia, memory loss, TTP. EXAM: MRI HEAD  WITHOUT CONTRAST TECHNIQUE: Multiplanar, multiecho pulse sequences of the brain and surrounding structures were obtained without intravenous contrast. COMPARISON:  CT head June 08, 2015 and MRI brain July 17, 2014 FINDINGS: Multiple sequences are moderately motion degraded. No reduced diffusion to suggest acute ischemia. No susceptibility artifact to suggest hemorrhage though, sequences moderately motion degraded. LEFT temporal parietal encephalomalacia with ex vacuo dilatation LEFT ventricle atrium, no hydrocephalus. Moderate white matter changes better seen on prior MRI, compatible with chronic small vessel ischemic disease. No midline shift or mass effect. No abnormal extra-axial fluid collections. Major intracranial vascular flow voids at the skull base. Paranasal sinuses and mastoid air cells appear well-aerated. No abnormal sellar expansion. No definite abnormal calvarial bone marrow signal though motion degrades sensitivity. IMPRESSION: No acute intracranial process on this motion degraded examination. Chronic changes including old LEFT MCA territory infarct and moderate white matter changes compatible chronic small vessel ischemic disease. Electronically Signed   By: Elon Alas M.D.   On: 06/09/2015 02:18   Dg Chest Port 1 View  06/08/2015  CLINICAL DATA:  Altered mental status. EXAM: PORTABLE CHEST 1 VIEW COMPARISON:  03/10/2015 and 03/09/2015 and 07/17/2014 FINDINGS: Heart size and pulmonary vascularity are normal and  the lungs are clear. Tortuosity and calcification of the thoracic aorta. IMPRESSION: No acute abnormality.  Aortic atherosclerosis. Electronically Signed   By: Lorriane Shire M.D.   On: 06/08/2015 20:41    Scheduled Meds: . apixaban  2.5 mg Oral BID  . cefTRIAXone (ROCEPHIN)  IV  1 g Intravenous QHS  . Chlorhexidine Gluconate Cloth  6 each Topical Q0600  . divalproex  250 mg Oral BID  . ferrous sulfate  325 mg Oral Daily  . lacosamide  200 mg Oral BID  .  levETIRAcetam  1,000 mg Oral BID  . metoprolol  2.5 mg Intravenous 3 times per day  . mupirocin ointment  1 application Nasal BID   Continuous Infusions: . sodium chloride 10 mL/hr at 06/09/15 0022      Time spent: [place], [place]  [facility] Pager [phone]. If 7PM-7AM, please contact night-coverage at [email or web address], password [facility] 06/09/2015, 4:38 PM  LOS: 1 day

## 2015-06-09 NOTE — Clinical Social Work Note (Signed)
Clinical Social Work Assessment  Patient Details  Name: Tanya White MRN: [id number] Date of Birth: [birth year]  Date of referral:  06/09/15               Reason for consult:  Facility Placement                Permission sought to share information with:    Permission granted to share information::  No (pt DO)  Name::     Tanya White  Agency::  [facility]  Relationship::  nephew/HCPOA  Contact Information:     Housing/Transportation Living arrangements for the past 2 months:  [place] of Information:  Medical laboratory scientific officer Patient Interpreter Needed:  None Criminal Activity/Legal Involvement Pertinent to Current Situation/Hospitalization:  No - Comment as needed Significant Relationships:  Other Family Members, Adult Children Lives with:  Facility Resident Do you feel safe going back to the place where you live?  Yes Need for family participation in patient care:  Yes (Comment) (decision making)  Care giving concerns:  None- pt is LTC facility resident   Social Worker assessment / plan:  CSW spoke with Tanya White [facility]) concerning return to [facility] when pt is medically stable for DC  Employment status:  Retired Forensic scientist:    PT Recommendations:  Not assessed at this time Information / Referral to community resources:     Patient/Family's Response to care:  Pt HCPOA is agreeable to pt return to LTC facility  Patient/Family's Understanding of and Emotional Response to Diagnosis, Current Treatment, and Prognosis:  Clear understanding- no questions or concerns  Emotional Assessment Appearance:  Appears stated age Attitude/Demeanor/Rapport:  Unable to Assess Affect (typically observed):  Unable to Assess Orientation:  Fluctuating Orientation (Suspected and/or reported Sundowners) Alcohol / Substance use:  Not Applicable Psych involvement (Current and /or in the community):  No (Comment)  Discharge Needs  Concerns to be addressed:   Care Coordination Readmission within the last 30 days:  No Current discharge risk:  None Barriers to Discharge:  Continued Medical Work up   [employer], LCSW 06/09/2015, 2:27 PM

## 2015-06-09 NOTE — Progress Notes (Addendum)
ANTICOAGULATION/ANTIBIOTIC CONSULT NOTE - Initial Consult  Pharmacy Consult for Heparin and Ceftriaxone Indication: h/o PE and UTI  Allergies  Allergen Reactions  . Codeine Shortness Of Breath  . Lipitor [Atorvastatin] Diarrhea and Other (See Comments)    GI upset, rhabdomyalosis   . Aspirin Diarrhea and Other (See Comments)    Only ASA 325mg  ; pt can tolerate low dose asa 81mg  Reaction : GI upset   . Hctz [Hydrochlorothiazide] Rash  . Hydralazine Other (See Comments)    Unknown reaction - listed on [facility] 06/08/2015  . Reserpine Other (See Comments)    Unknown reaction - listed on [facility] 06/08/2015  . Tramadol Other (See Comments)    Unknown reaction - listed on [facility] 06/08/2015    Patient Measurements: Height: 4\' 9"  (144.8 cm) Weight: 94 lb 12.8 oz (43 kg) IBW/kg (Calculated) : 38.6 Heparin Dosing Weight: 43 kg  Vital Signs: Temp: 97.8 F (36.6 C) (10/24 0000) Temp Source: Oral (10/24 0000) BP: 139/94 mmHg (10/24 0000) Pulse Rate: 86 (10/24 0000)  Labs:  Recent Labs  06/08/15 2042 06/08/15 2055  HGB 11.7* 12.6  HCT 33.1* 37.0  PLT 199  --   APTT 29  --   LABPROT 16.1*  --   INR 1.28  --   CREATININE 1.01* 1.10*    Estimated Creatinine Clearance: 26.1 mL/min (by C-G formula based on Cr of 1.1).   Medical History: Past Medical History  Diagnosis Date  . Seizures ([place])   . Hypotension   . Pneumonia   . Stroke ([place])   . GI bleed   . Pulmonary embolism ([place])   . Gastrostomy tube in place [facility]   . Memory loss   . TTP (thrombotic thrombocytopenic purpura) (HCC)     Treated with PLEX in 2012 at [facility]    Medications:  Prescriptions prior to admission  Medication Sig Dispense Refill Last Dose  . acetaminophen (TYLENOL) 325 MG tablet Take 2 tablets (650 mg total) by mouth every 6 (six) hours as needed for mild pain or fever.   several months ago  . albuterol (PROVENTIL) (2.5 MG/3ML) 0.083% nebulizer solution Take 2.5 mg by nebulization every 2 (two) hours as  needed for wheezing or shortness of breath.   several months ago  . apixaban (ELIQUIS) 5 MG TABS tablet Take 1 tablet (5 mg total) by mouth 2 (two) times daily. 60 tablet  06/05/2015 at 1900  . ciprofloxacin (CIPRO) 500 MG tablet Take 500 mg by mouth 2 (two) times daily. 7 day course started 06/04/15   06/07/2015 at 1900  . Cranberry 250 MG TABS Take 250 mg by mouth daily.   06/05/2015 at 700  . divalproex (DEPAKOTE) 250 MG DR tablet Take 250 mg by mouth 2 (two) times daily.   06/07/2015 at 1900  . famotidine (PEPCID) 20 MG tablet Take 20 mg by mouth daily.    06/05/2015 at 700  . ferrous sulfate 325 (65 FE) MG tablet Take 1 tablet (325 mg total) by mouth daily.  3 06/05/2015 at 700  . furosemide (LASIX) 40 MG tablet Take 1 tablet (40 mg total) by mouth daily as needed (for leg swelling.  Give PRN potassium when Lasix is given). 30 tablet  3-4 weeks ago  . lacosamide (VIMPAT) 200 MG TABS tablet Take 1 tablet (200 mg total) by mouth 2 (two) times daily. 60 tablet  06/07/2015 at 1900  . levETIRAcetam (KEPPRA) 1000 MG tablet Take 1,000 mg by mouth 2 (two) times daily.   06/05/2015  at 1900  . LORazepam (ATIVAN) 2 MG/ML injection Inject 1 mg into the muscle every 6 (six) hours as needed for seizure.   06/08/2015 at 1830  . metoprolol tartrate (LOPRESSOR) 25 MG tablet Take 37.5 mg by mouth 2 (two) times daily.    06/05/2015 at 1900  . ondansetron (ZOFRAN) 4 MG tablet Take 1 tablet (4 mg total) by mouth every 4 (four) hours as needed for nausea. 20 tablet 0 several months ago  . potassium chloride SA (KLOR-CON M20) 20 MEQ tablet Take 1 tablet (20 mEq total) by mouth daily as needed (whenever she is given Lasix). (Patient taking differently: Take 20 mEq by mouth daily as needed (whenever lasix is administered). )   3-4 weeks ago  . PRESCRIPTION MEDICATION Apply 1 mg topically every 6 (six) hours as needed (agitation/anxiety). Ativan Gel 1 mg   06/08/2015 at 1720  . magnesium oxide (MAG-OX) 400 MG tablet Take  1 tablet (400 mg total) by mouth daily. (Patient not taking: Reported on 06/08/2015)   Not Taking at Unknown time  . trimethoprim (TRIMPEX) 100 MG tablet Take 100 mg by mouth daily. Continuous course   06/04/2015    Assessment: 79 y.o. F presents from [facility] with AMS and seizures.   AC: Pt on Eliquis PTA for h/o PE. Per NH, pt has been refusing all medications x 4 days including Eliquis. Eliquis should not still be affecting heparin level but will verify with baseline heparin level. Baseline aPTT 29. CBC ok on admission. Est CrCl 26 ml/min.  ID: Ceftriaxone for UTI. WBC wnl. Afeb.  Goal of Therapy:  Heparin level 0.3-0.7 units/ml Monitor platelets by anticoagulation protocol: Yes   Plan:  Baseline heparin level to verify that Eliquis not affecting Heparin IV bolus 2000 units Heparin gtt at 700 units/hr Will f/u heparin level in 8 hours Daily heparin level and CBC Ceftriaxone 1gm IV q24h  Sherlon Handing, PharmD, BCPS Clinical pharmacist, pager [phone] 06/09/2015,12:25 AM    Addendum: Baseline heparin level 0.16 (Eliquis still slightly affecting slightly).  Plan: Will add PTT to 0900 heparin level to see if correlating yet  Sherlon Handing, PharmD, BCPS Clinical pharmacist, pager [phone] 06/09/2015 1:11 AM

## 2015-06-09 NOTE — Progress Notes (Signed)
EEG Completed; Results Pending  

## 2015-06-09 NOTE — H&P (Signed)
[facility] History and Physical  Tanya White QQV:[id number] DOB: 1936/02/13 DOA: 06/08/2015  Referring physician: Ms.Humes. PCP: [name], MD  Specialists: Dr.Yan. Neurologist.  History obtained from patient's nephew who is also the healthcare power of attorney.  Chief complaint:  Confusion and possible seizure.   HPI: Tanya White is a 79 y.o. female  history of seizure, CVA, history of PE Apixaban was brought to the ER from living facility after patient was found to have possible tonic-clonic seizure-like episode. Start fairly normal how long patient had a seizure-like episode but as per patient's nephew patient has been increasingly confused over the last 2-3 days and 3 days ago was diagnosed with UTI and was placed on Cipro. Patient has history of non-being, while with her medication and refuses medication sometimes and has been admitted for similar seizure-like episodes previously when patient was noncompliant. In July of this year patient also was intubated. CT head did not show anything acute. On exam patient is following commands but still confused and patient's nephew feels that patient has not come back to her baseline. Patient has been admitted for possible postictal confusion and UTI. As per patient's nephew patient did not have any nausea vomiting diarrhea abdominal pain chest pain or shortness of breath.    Review of Systems: As presented in the history of presenting illness, rest negative.  Past Medical History  Diagnosis Date  . Seizures ([place])   . Hypotension   . Pneumonia   . Stroke ([place])   . GI bleed   . Pulmonary embolism ([place])   . Gastrostomy tube in place [facility])   . Memory loss   . TTP (thrombotic thrombocytopenic purpura) (H[facility])     Treated with PLEX in 2012 at [facility]   Past Surgical History  Procedure Laterality Date  . Appendectomy    . Jejunostomy feeding tube     Social History:  reports that she has quit smoking. She has never used smokeless  tobacco. She reports that she does not drink alcohol or use illicit drugs. Where does patient live nursing home Can patient participate in ADLs? Yes.  Allergies  Allergen Reactions  . Codeine Shortness Of Breath  . Lipitor [Atorvastatin] Diarrhea and Other (See Comments)    GI upset, rhabdomyalosis   . Aspirin Diarrhea and Other (See Comments)    Only ASA 325mg  ; pt can tolerate low dose asa 81mg  Reaction : GI upset   . Hctz [Hydrochlorothiazide] Rash  . Hydralazine Other (See Comments)    Unknown reaction - listed on [facility] 06/08/2015  . Reserpine Other (See Comments)    Unknown reaction - listed on [facility] 06/08/2015  . Tramadol Other (See Comments)    Unknown reaction - listed on [facility] 06/08/2015    Family History:  Family History  Problem Relation Age of Onset  . High blood pressure Mother   . Cancer    . Stroke        Prior to Admission medications   Medication Sig Start Date End Date Taking? Authorizing Provider  acetaminophen (TYLENOL) 325 MG tablet Take 2 tablets (650 mg total) by mouth every 6 (six) hours as needed for mild pain or fever. 07/22/14  Yes Bonnielee Haff, MD  albuterol (PROVENTIL) (2.5 MG/3ML) 0.083% nebulizer solution Take 2.5 mg by nebulization every 2 (two) hours as needed for wheezing or shortness of breath.   Yes Historical Provider, MD  apixaban (ELIQUIS) 5 MG TABS tablet Take 1 tablet (5 mg total) by mouth 2 (two) times daily.  07/22/14  Yes Bonnielee Haff, MD  ciprofloxacin (CIPRO) 500 MG tablet Take 500 mg by mouth 2 (two) times daily. 7 day course started 06/04/15   Yes Historical Provider, MD  Cranberry 250 MG TABS Take 250 mg by mouth daily.   Yes Historical Provider, MD  divalproex (DEPAKOTE) 250 MG DR tablet Take 250 mg by mouth 2 (two) times daily.   Yes Historical Provider, MD  famotidine (PEPCID) 20 MG tablet Take 20 mg by mouth daily.    Yes Historical Provider, MD  ferrous sulfate 325 (65 FE) MG tablet Take 1 tablet (325 mg total) by mouth daily.  07/22/14  Yes Bonnielee Haff, MD  furosemide (LASIX) 40 MG tablet Take 1 tablet (40 mg total) by mouth daily as needed (for leg swelling.  Give PRN potassium when Lasix is given). 07/22/14  Yes Bonnielee Haff, MD  lacosamide (VIMPAT) 200 MG TABS tablet Take 1 tablet (200 mg total) by mouth 2 (two) times daily. 07/22/14  Yes Bonnielee Haff, MD  levETIRAcetam (KEPPRA) 1000 MG tablet Take 1,000 mg by mouth 2 (two) times daily.   Yes Historical Provider, MD  LORazepam (ATIVAN) 2 MG/ML injection Inject 1 mg into the muscle every 6 (six) hours as needed for seizure.   Yes Historical Provider, MD  metoprolol tartrate (LOPRESSOR) 25 MG tablet Take 37.5 mg by mouth 2 (two) times daily.    Yes Historical Provider, MD  ondansetron (ZOFRAN) 4 MG tablet Take 1 tablet (4 mg total) by mouth every 4 (four) hours as needed for nausea. 07/22/14  Yes Bonnielee Haff, MD  potassium chloride SA (KLOR-CON M20) 20 MEQ tablet Take 1 tablet (20 mEq total) by mouth daily as needed (whenever she is given Lasix). Patient taking differently: Take 20 mEq by mouth daily as needed (whenever lasix is administered).  07/22/14  Yes Bonnielee Haff, MD  PRESCRIPTION MEDICATION Apply 1 mg topically every 6 (six) hours as needed (agitation/anxiety). Ativan Gel 1 mg   Yes Historical Provider, MD  magnesium oxide (MAG-OX) 400 MG tablet Take 1 tablet (400 mg total) by mouth daily. Patient not taking: Reported on 06/08/2015 03/12/15   Marijean Heath, NP  trimethoprim (TRIMPEX) 100 MG tablet Take 100 mg by mouth daily. Continuous course    Historical Provider, MD    Physical Exam: Filed Vitals:   06/08/15 2300 06/08/15 2315 06/08/15 2356 06/09/15 0000  BP: 92/61 135/81  139/94  Pulse:  119  86  Temp:    97.8 F (36.6 C)  TempSrc:    Oral  Resp: 18 16  26   Height:   4\' 9"  (1.448 m)   Weight:   43 kg (94 lb 12.8 oz)   SpO2:  100%  100%     General:  Moderately built and poorly nourished.  Eyes: Anicteric. No pallor.  ENT: No  discharge from the ears eyes nose or mouth.  Neck: No mass felt. No neck rigidity.  Cardiovascular: S1 and S2 heard.  Respiratory: No rhonchi or crepitations.  Abdomen: Soft nontender bowel sounds present.  Skin: No rash.  Musculoskeletal: No edema.  Psychiatric: Appears confused.  Neurologic: Appears confused. Follows commands and moves all extremities. Perla positive.  Labs on Admission:  Basic Metabolic Panel:  Recent Labs Lab 06/08/15 2042 06/08/15 2055  NA 138 140  K 4.6 4.7  CL 102 104  CO2 28  --   GLUCOSE 109* 107*  BUN 20 28*  CREATININE 1.01* 1.10*  CALCIUM 8.8*  --  Liver Function Tests:  Recent Labs Lab 06/08/15 2042  AST 23  ALT 12*  ALKPHOS 48  BILITOT 0.6  PROT 6.3*  ALBUMIN 3.4*   No results for input(s): LIPASE, AMYLASE in the last 168 hours. No results for input(s): AMMONIA in the last 168 hours. CBC:  Recent Labs Lab 06/08/15 2042 06/08/15 2055  WBC 5.4  --   NEUTROABS 4.1  --   HGB 11.7* 12.6  HCT 33.1* 37.0  MCV 83.0  --   PLT 199  --    Cardiac Enzymes: No results for input(s): CKTOTAL, CKMB, CKMBINDEX, TROPONINI in the last 168 hours.  BNP (last 3 results) No results for input(s): BNP in the last 8760 hours.  ProBNP (last 3 results) No results for input(s): PROBNP in the last 8760 hours.  CBG:  Recent Labs Lab 06/08/15 2052  GLUCAP 48    Radiological Exams on Admission: Ct Head Wo Contrast  06/08/2015  CLINICAL DATA:  79 year old female with altered mental status today. EXAM: CT HEAD WITHOUT CONTRAST TECHNIQUE: Contiguous axial images were obtained from the base of the skull through the vertex without intravenous contrast. COMPARISON:  Head CT 03/09/2015. FINDINGS: Large area of low attenuation in the left parietal region, similar to prior examinations, compatible with encephalomalacia from remote left posterior MCA territory infarct. Patchy and confluent areas of decreased attenuation are noted throughout the  deep and periventricular white matter of the cerebral hemispheres bilaterally, compatible with chronic microvascular ischemic disease. Physiologic calcifications are again noted in the basal ganglia bilaterally. No acute intracranial abnormalities. Specifically, no evidence of acute intracranial hemorrhage, no definite findings of acute/subacute cerebral ischemia, no mass, mass effect, hydrocephalus or abnormal intra or extra-axial fluid collections. Visualized paranasal sinuses and mastoids are well pneumatized. No acute displaced skull fractures are identified. IMPRESSION: 1. No acute intracranial abnormalities. 2. Sequela of old left MCA territory infarct and extensive chronic microvascular ischemic changes in cerebral white matter redemonstrated, as above. Electronically Signed   By: Vinnie Langton M.D.   On: 06/08/2015 21:42   Dg Chest Port 1 View  06/08/2015  CLINICAL DATA:  Altered mental status. EXAM: PORTABLE CHEST 1 VIEW COMPARISON:  03/10/2015 and 03/09/2015 and 07/17/2014 FINDINGS: Heart size and pulmonary vascularity are normal and the lungs are clear. Tortuosity and calcification of the thoracic aorta. IMPRESSION: No acute abnormality.  Aortic atherosclerosis. Electronically Signed   By: Lorriane Shire M.D.   On: 06/08/2015 20:41    EKG: Independently reviewed.  Normal sinus rhythm.  Assessment/Plan Principal Problem:   Acute encephalopathy Active Problems:   Pulmonary embolism (HCC)   Seizures (HCC)   UTI (lower urinary tract infection)   1. Acute encephalopathy - most likely postictal and also could be secondary UTI. I have discussed with on-call neurologist Dr. Leonel Ramsay since patient at this time is following commands is not in status. I have placed patient on her home dose of antiepileptics through IV dose as patient has failed swallow. MRI and EEG has been ordered. Patient has been placed on ceftriaxone for UTI. 2. Seizure disorder - see #1. 3. UTI - on ceftriaxone. Follow  urine cultures. 4. History of PE - since patient has failed swallow at this time or place patient on IV heparin which can be changed to oral Apixaban once patient passes swallow. 5. Anemia - follow CBC. 6. History of hypertension - since patient has failed swallow I have placed patient on scheduled dose of metoprolol skin change to by mouth metoprolol once patient passes  swallow.  I have reviewed patient's old charts and labs. Personally reviewed patient's EKG and chest x-ray. I have discussed with on-call neurologist.   DVT Prophylaxis heparin.  Code Status: Full code.  Family Communication: Patient's nephew.  Disposition Plan: Admit to inpatient.    KAKRAKANDY,ARSHAD N. [facility] Pager [phone].  If 7PM-7AM, please contact night-coverage [email or web address] Password [email or web address] 06/09/2015, 12:19 AM

## 2015-06-09 NOTE — Care Management Note (Signed)
Case Management Note  Patient Details  Name: Tanya White MRN: [id number] Date of Birth: 19-Feb-1936  Subjective/Objective:     Patient is from [facility], per CSW note, who spoke with POA, plan is for patient to return to [facility] at dc per CSW notes.               Action/Plan:   Expected Discharge Date:                  Expected Discharge Plan:  Skilled Nursing Facility  In-House Referral:  Clinical Social Work  Discharge planning Services  CM Consult  Post Acute Care Choice:    Choice offered to:     DME Arranged:    DME Agency:     HH Arranged:    [place] Agency:     Status of Service:  In process, will continue to follow  Medicare Important Message Given:    Date Medicare IM Given:    Medicare IM give by:    Date Additional Medicare IM Given:    Additional Medicare Important Message give by:     If discussed at [place] of Stay Meetings, dates discussed:    Additional Comments:  Zenon Mayo, RN 06/09/2015, 3:57 PM

## 2015-06-09 NOTE — Progress Notes (Signed)
NURSING PROGRESS NOTE  [name] [id number] Transfer Data: 06/09/2015 1:13 PM Attending Provider: Louellen Molder, MD RXY:[id number],WKMQK, MD Code Status: Full  Allergies:  Codeine; Lipitor; Aspirin; Hctz; Hydralazine; Reserpine; and Tramadol Past Medical History:   has a past medical history of Seizures ([place]); Hypotension; Pneumonia; Stroke [facility]); GI bleed; Pulmonary embolism ([place]); Gastrostomy tube in place [facility]); Memory loss; and TTP (thrombotic thrombocytopenic purpura) ([place]). Past Surgical History:   has past surgical history that includes Appendectomy and Jejunostomy feeding tube. Social History:   reports that she has quit smoking. She has never used smokeless tobacco. She reports that she does not drink alcohol or use illicit drugs.  Tanya White is a 79 y.o. female patient transferred from 2C>  Blood pressure 161/71, pulse 63, temperature 97.6 F (36.4 C), temperature source Oral, resp. rate 20, height 4\' 9"  (1.448 m), weight 43.2 kg (95 lb 3.8 oz), SpO2 100 %.  Cardiac Monitoring: Box # 9 in place. Cardiac monitor yields:normal sinus rhythm.  IV Fluids:  IV in place, occlusive dsg intact without redness, IV cath antecubital left, condition patent and no redness none.   Skin: WDL  Will continue to evaluate and treat per MD orders.   Hendricks Limes RN, BS, BSN

## 2015-06-09 NOTE — Progress Notes (Signed)
Report received from Anna RN.

## 2015-06-10 DIAGNOSIS — G9340 Encephalopathy, unspecified: Secondary | ICD-10-CM

## 2015-06-10 LAB — URINE CULTURE: CULTURE: NO GROWTH

## 2015-06-10 LAB — LEVETIRACETAM LEVEL: Levetiracetam Lvl: 16.3 ug/mL (ref 10.0–40.0)

## 2015-06-10 MED ORDER — MAGNESIUM OXIDE 400 MG PO TABS: 400.0000 mg | ORAL_TABLET | Freq: Every day | ORAL | Status: DC

## 2015-06-10 MED ORDER — METOPROLOL TARTRATE 25 MG PO TABS
37.5000 mg | ORAL_TABLET | Freq: Two times a day (BID) | ORAL | Status: DC
  Administered 2015-06-10: 37.5 mg via ORAL
  Filled 2015-06-10: qty 1

## 2015-06-10 MED ORDER — LORAZEPAM 2 MG/ML IJ SOLN: 1.0000 mg | Freq: Four times a day (QID) | INTRAMUSCULAR | Status: DC | PRN

## 2015-06-10 MED ORDER — FUROSEMIDE 40 MG PO TABS: 40.0000 mg | ORAL_TABLET | Freq: Every day | ORAL | Status: DC | PRN

## 2015-06-10 MED ORDER — ONDANSETRON HCL 4 MG PO TABS: 4.0000 mg | ORAL_TABLET | ORAL | Status: DC | PRN

## 2015-06-10 MED ORDER — MAGNESIUM OXIDE 400 (241.3 MG) MG PO TABS
400.0000 mg | ORAL_TABLET | Freq: Every day | ORAL | Status: DC
Start: 2015-06-10 — End: 2015-06-10
  Administered 2015-06-10: 400 mg via ORAL
  Filled 2015-06-10: qty 1

## 2015-06-10 MED ORDER — APIXABAN 2.5 MG PO TABS: 2.5000 mg | ORAL_TABLET | Freq: Two times a day (BID) | ORAL | Status: AC

## 2015-06-10 MED ORDER — POTASSIUM CHLORIDE CRYS ER 20 MEQ PO TBCR: 20.0000 meq | EXTENDED_RELEASE_TABLET | Freq: Every day | ORAL | Status: DC | PRN

## 2015-06-10 MED ORDER — ACETAMINOPHEN 325 MG PO TABS: 650.0000 mg | ORAL_TABLET | Freq: Four times a day (QID) | ORAL | Status: DC | PRN

## 2015-06-10 MED ORDER — CRANBERRY 250 MG PO TABS: 250.0000 mg | ORAL_TABLET | Freq: Every day | ORAL | Status: DC

## 2015-06-10 MED ORDER — FAMOTIDINE 20 MG PO TABS
20.0000 mg | ORAL_TABLET | Freq: Every day | ORAL | Status: DC
  Administered 2015-06-10: 20 mg via ORAL
  Filled 2015-06-10: qty 1

## 2015-06-10 NOTE — Care Management Note (Signed)
Case Management Note  Patient Details  Name: Tanya White MRN: [id number] Date of Birth: 1935/09/14  Subjective/Objective:   Patient is for dc back to snf today, ([facility]), CSW aware.                 Action/Plan:   Expected Discharge Date:                  Expected Discharge Plan:  Skilled Nursing Facility  In-House Referral:  Clinical Social Work  Discharge planning Services  CM Consult  Post Acute Care Choice:    Choice offered to:     DME Arranged:    DME Agency:     HH Arranged:    [place] Agency:     Status of Service:  Completed, signed off  Medicare Important Message Given:    Date Medicare IM Given:    Medicare IM give by:    Date Additional Medicare IM Given:    Additional Medicare Important Message give by:     If discussed at [place] of Stay Meetings, dates discussed:    Additional Comments:  Zenon Mayo, RN 06/10/2015, 12:09 PM

## 2015-06-10 NOTE — Discharge Summary (Signed)
Physician Discharge Summary  Tanya White WUX:[id number] DOB: [birth year] DOA: 06/08/2015  PCP: [name], MD  Admit date: 06/08/2015 Discharge date: 06/10/2015  Time spent: 35 minutes  Recommendations for Outpatient Follow-up:  1. Needs Chem 12 and cbc ~ 1 week 2. Needs OP Neurology input  3. Re-orient as necessary and consider OP Meds as per Neurology [place]  4. May give meds with apple sauce or in food if possible-might not tolerate this per relatives  Discharge Diagnoses:  Principal Problem:   Acute encephalopathy Active Problems:   Pulmonary embolism (HCC)   Seizures (HCC)   UTI (lower urinary tract infection)   Discharge Condition: stable and improved  Diet recommendation: Dysphagia 3 diet  Filed Weights   06/08/15 2356 06/09/15 1249  Weight: 43 kg (94 lb 12.8 oz) 43.2 kg (95 lb 3.8 oz)    History of present illness: 79 year old female with   history of seizures,  CVA 2012 R sided weak Prior Pancreatitis with Pelvic abcess-this was Rx in [facility] with multiple Abx and she was foiund to also have complex partial seizures? Partial status  with mild to moderate dementia,   history of PE on  Eliquis,   resident of [facility] brought to the ED 06/09/15 am after found to have a tonic-clonic seizure like episode.  Patient found to be acutely encephalopathy on admission. As per her nephew she was increasingly confused over the past 2-3 days, was diagnosed of UTI and placed on Cipro at the facility.   She has been frequently noncompliant with her her antiseizure medications.  Hospital Course:  Acute encephalopathy Postictal state.  UTI was negative  On Urine cultures. Patient still quite confused and occasionally agitated. Cleared swallowing eval today. Resumed her home antiepileptics. (Vimpat, Depakote and Keppra). MRI of the brain was chronic changes only (old left MCA territory infarct). EEG without epileptiform activity and shows generalized slowing suggestive  of encephalopathy. -Continue neuro checks.  Essential hypertension Resume home medications.  History of PE On Eliquis which is resumed aty a lower dose 2.5 bid as per guidelines  Iron deficiency anemia On iron supplements.   Procedures:  IMPRESSION: Abnormal EEG due to the presence of generalized slowing indicating a mild to moderate cerebral disturbance (encephalopathy). No epileptiform activity noted.   MRI headf no new deficit   Consultations:  none  Discharge Exam: Filed Vitals:   06/10/15 0818  BP: 144/53  Pulse: 68  Temp: 97.7 F (36.5 C)  Resp: 16   Confused and at usual baseline No apparent distress  Rn reports tol diet No other c/o voiced   General:  eomi ncat Cardiovascular: s1 s 2no m/r/g Respiratory: clear no added sound  Discharge Instructions    Current Discharge Medication List    CONTINUE these medications which have NOT CHANGED   Details  acetaminophen (TYLENOL) 325 MG tablet Take 2 tablets (650 mg total) by mouth every 6 (six) hours as needed for mild pain or fever.    albuterol (PROVENTIL) (2.5 MG/3ML) 0.083% nebulizer solution Take 2.5 mg by nebulization every 2 (two) hours as needed for wheezing or shortness of breath.    apixaban (ELIQUIS) 5 MG TABS tablet Take 1 tablet (5 mg total) by mouth 2 (two) times daily. Qty: 60 tablet    ciprofloxacin (CIPRO) 500 MG tablet Take 500 mg by mouth 2 (two) times daily. 7 day course started 06/04/15    Cranberry 250 MG TABS Take 250 mg by mouth daily.    divalproex (DEPAKOTE) 250  MG DR tablet Take 250 mg by mouth 2 (two) times daily.    famotidine (PEPCID) 20 MG tablet Take 20 mg by mouth daily.     ferrous sulfate 325 (65 FE) MG tablet Take 1 tablet (325 mg total) by mouth daily. Refills: 3    furosemide (LASIX) 40 MG tablet Take 1 tablet (40 mg total) by mouth daily as needed (for leg swelling.  Give PRN potassium when Lasix is given). Qty: 30 tablet    lacosamide (VIMPAT) 200 MG TABS  tablet Take 1 tablet (200 mg total) by mouth 2 (two) times daily. Qty: 60 tablet    levETIRAcetam (KEPPRA) 1000 MG tablet Take 1,000 mg by mouth 2 (two) times daily.    LORazepam (ATIVAN) 2 MG/ML injection Inject 1 mg into the muscle every 6 (six) hours as needed for seizure.    metoprolol tartrate (LOPRESSOR) 25 MG tablet Take 37.5 mg by mouth 2 (two) times daily.     ondansetron (ZOFRAN) 4 MG tablet Take 1 tablet (4 mg total) by mouth every 4 (four) hours as needed for nausea. Qty: 20 tablet, Refills: 0    potassium chloride SA (KLOR-CON M20) 20 MEQ tablet Take 1 tablet (20 mEq total) by mouth daily as needed (whenever she is given Lasix).    PRESCRIPTION MEDICATION Apply 1 mg topically every 6 (six) hours as needed (agitation/anxiety). Ativan Gel 1 mg    magnesium oxide (MAG-OX) 400 MG tablet Take 1 tablet (400 mg total) by mouth daily.    trimethoprim (TRIMPEX) 100 MG tablet Take 100 mg by mouth daily. Continuous course       Allergies  Allergen Reactions  . Codeine Shortness Of Breath  . Lipitor [Atorvastatin] Diarrhea and Other (See Comments)    GI upset, rhabdomyalosis   . Aspirin Diarrhea and Other (See Comments)    Only ASA 325mg  ; pt can tolerate low dose asa 81mg  Reaction : GI upset   . Hctz [Hydrochlorothiazide] Rash  . Hydralazine Other (See Comments)    Unknown reaction - listed on [facility] 06/08/2015  . Reserpine Other (See Comments)    Unknown reaction - listed on [facility] 06/08/2015  . Tramadol Other (See Comments)    Unknown reaction - listed on [facility] 06/08/2015      The results of significant diagnostics from this hospitalization (including imaging, microbiology, ancillary and laboratory) are listed below for reference.    Significant Diagnostic Studies: Ct Head Wo Contrast  06/08/2015  CLINICAL DATA:  79 year old female with altered mental status today. EXAM: CT HEAD WITHOUT CONTRAST TECHNIQUE: Contiguous axial images were obtained from the base of the skull  through the vertex without intravenous contrast. COMPARISON:  Head CT 03/09/2015. FINDINGS: Large area of low attenuation in the left parietal region, similar to prior examinations, compatible with encephalomalacia from remote left posterior MCA territory infarct. Patchy and confluent areas of decreased attenuation are noted throughout the deep and periventricular white matter of the cerebral hemispheres bilaterally, compatible with chronic microvascular ischemic disease. Physiologic calcifications are again noted in the basal ganglia bilaterally. No acute intracranial abnormalities. Specifically, no evidence of acute intracranial hemorrhage, no definite findings of acute/subacute cerebral ischemia, no mass, mass effect, hydrocephalus or abnormal intra or extra-axial fluid collections. Visualized paranasal sinuses and mastoids are well pneumatized. No acute displaced skull fractures are identified. IMPRESSION: 1. No acute intracranial abnormalities. 2. Sequela of old left MCA territory infarct and extensive chronic microvascular ischemic changes in cerebral white matter redemonstrated, as above. Electronically Signed  By: Vinnie Langton M.D.   On: 06/08/2015 21:42   Mr Brain Wo Contrast  06/09/2015  CLINICAL DATA:  Altered mental status, refusing medication for 4 days at nursing home. History of seizures, hypertension, pneumonia, memory loss, TTP. EXAM: MRI HEAD WITHOUT CONTRAST TECHNIQUE: Multiplanar, multiecho pulse sequences of the brain and surrounding structures were obtained without intravenous contrast. COMPARISON:  CT head June 08, 2015 and MRI brain July 17, 2014 FINDINGS: Multiple sequences are moderately motion degraded. No reduced diffusion to suggest acute ischemia. No susceptibility artifact to suggest hemorrhage though, sequences moderately motion degraded. LEFT temporal parietal encephalomalacia with ex vacuo dilatation LEFT ventricle atrium, no hydrocephalus. Moderate white matter  changes better seen on prior MRI, compatible with chronic small vessel ischemic disease. No midline shift or mass effect. No abnormal extra-axial fluid collections. Major intracranial vascular flow voids at the skull base. Paranasal sinuses and mastoid air cells appear well-aerated. No abnormal sellar expansion. No definite abnormal calvarial bone marrow signal though motion degrades sensitivity. IMPRESSION: No acute intracranial process on this motion degraded examination. Chronic changes including old LEFT MCA territory infarct and moderate white matter changes compatible chronic small vessel ischemic disease. Electronically Signed   By: Elon Alas M.D.   On: 06/09/2015 02:18   Dg Chest Port 1 View  06/08/2015  CLINICAL DATA:  Altered mental status. EXAM: PORTABLE CHEST 1 VIEW COMPARISON:  03/10/2015 and 03/09/2015 and 07/17/2014 FINDINGS: Heart size and pulmonary vascularity are normal and the lungs are clear. Tortuosity and calcification of the thoracic aorta. IMPRESSION: No acute abnormality.  Aortic atherosclerosis. Electronically Signed   By: Lorriane Shire M.D.   On: 06/08/2015 20:41    Microbiology: Recent Results (from the past 240 hour(s))  Urine culture     Status: None   Collection Time: 06/08/15  9:49 PM  Result Value Ref Range Status   Specimen Description URINE, RANDOM  Final   Special Requests NONE  Final   Culture NO GROWTH 1 DAY  Final   Report Status 06/10/2015 FINAL  Final  MRSA PCR Screening     Status: Abnormal   Collection Time: 06/09/15 12:42 AM  Result Value Ref Range Status   MRSA by PCR POSITIVE (A) NEGATIVE Final    Comment:        The GeneXpert MRSA Assay (FDA approved for NASAL specimens only), is one component of a comprehensive MRSA colonization surveillance program. It is not intended to diagnose MRSA infection nor to guide or monitor treatment for MRSA infections. RESULT CALLED TO, READ BACK BY AND VERIFIED WITH: REAP,B RN 0401 06/09/15  MITCHELL,L      Labs: Basic Metabolic Panel:  Recent Labs Lab 06/08/15 2042 06/08/15 2055 06/09/15 0042  NA 138 140 136  K 4.6 4.7 4.5  CL 102 104 103  CO2 28  --  26  GLUCOSE 109* 107* 111*  BUN 20 28* 16  CREATININE 1.01* 1.10* 0.91  CALCIUM 8.8*  --  8.7*   Liver Function Tests:  Recent Labs Lab 06/08/15 2042 06/09/15 0042  AST 23 22  ALT 12* 12*  ALKPHOS 48 49  BILITOT 0.6 0.5  PROT 6.3* 6.4*  ALBUMIN 3.4* 3.5   No results for input(s): LIPASE, AMYLASE in the last 168 hours.  Recent Labs Lab 06/09/15 0042  AMMONIA 21   CBC:  Recent Labs Lab 06/08/15 2042 06/08/15 2055 06/09/15 0042  WBC 5.4  --  5.7  NEUTROABS 4.1  --   --   HGB 11.7*  12.6 12.2  HCT 33.1* 37.0 34.7*  MCV 83.0  --  82.8  PLT 199  --  177   Cardiac Enzymes: No results for input(s): CKTOTAL, CKMB, CKMBINDEX, TROPONINI in the last 168 hours. BNP: BNP (last 3 results) No results for input(s): BNP in the last 8760 hours.  ProBNP (last 3 results) No results for input(s): PROBNP in the last 8760 hours.  CBG:  Recent Labs Lab 06/08/15 2052  GLUCAP 96       Signed:  Nita Sells  [facility] 06/10/2015, 10:33 AM

## 2015-06-10 NOTE — Clinical Social Work Note (Signed)
Clinical Social Worker facilitated patient discharge including contacting patient family (daughter and voicemail with Philomena Doheny) and facility to confirm patient discharge plans.  Clinical information faxed to facility and family agreeable with plan.  CSW arranged ambulance transport via [facility] to [facility].  RN to call report prior to discharge.  Clinical Social Worker will sign off for now as social work intervention is no longer needed. Please consult [place] again if new need arises.  Barbette Or, [place]

## 2015-06-10 NOTE — Progress Notes (Signed)
Nsg Discharge Note  Admit Date:  06/08/2015 Discharge date: 06/10/2015   Tanya White to be D/C'd Nursing Home ([facility]) per MD order.  AVS completed.  Copy for chart, and copy for patient signed, and dated. Patient/caregiver able to verbalize understanding.  Discharge Medication:   Medication List    STOP taking these medications        ciprofloxacin 500 MG tablet  Commonly known as:  CIPRO     trimethoprim 100 MG tablet  Commonly known as:  TRIMPEX      TAKE these medications        acetaminophen 325 MG tablet  Commonly known as:  TYLENOL  Take 2 tablets (650 mg total) by mouth every 6 (six) hours as needed for mild pain or fever.     albuterol (2.5 MG/3ML) 0.083% nebulizer solution  Commonly known as:  PROVENTIL  Take 2.5 mg by nebulization every 2 (two) hours as needed for wheezing or shortness of breath.     apixaban 2.5 MG Tabs tablet  Commonly known as:  ELIQUIS  Take 1 tablet (2.5 mg total) by mouth 2 (two) times daily.     ATIVAN 2 MG/ML injection  Generic drug:  LORazepam  Inject 1 mg into the muscle every 6 (six) hours as needed for seizure.     Cranberry 250 MG Tabs  Take 250 mg by mouth daily.     divalproex 250 MG DR tablet  Commonly known as:  DEPAKOTE  Take 250 mg by mouth 2 (two) times daily.     famotidine 20 MG tablet  Commonly known as:  PEPCID  Take 20 mg by mouth daily.     ferrous sulfate 325 (65 FE) MG tablet  Take 1 tablet (325 mg total) by mouth daily.     furosemide 40 MG tablet  Commonly known as:  LASIX  Take 1 tablet (40 mg total) by mouth daily as needed (for leg swelling.  Give PRN potassium when Lasix is given).     lacosamide 200 MG Tabs tablet  Commonly known as:  VIMPAT  Take 1 tablet (200 mg total) by mouth 2 (two) times daily.     levETIRAcetam 1000 MG tablet  Commonly known as:  KEPPRA  Take 1,000 mg by mouth 2 (two) times daily.     magnesium oxide 400 MG tablet  Commonly known as:  MAG-OX  Take 1 tablet (400  mg total) by mouth daily.     metoprolol tartrate 25 MG tablet  Commonly known as:  LOPRESSOR  Take 37.5 mg by mouth 2 (two) times daily.     ondansetron 4 MG tablet  Commonly known as:  ZOFRAN  Take 1 tablet (4 mg total) by mouth every 4 (four) hours as needed for nausea.     potassium chloride SA 20 MEQ tablet  Commonly known as:  KLOR-CON M20  Take 1 tablet (20 mEq total) by mouth daily as needed (whenever she is given Lasix).     PRESCRIPTION MEDICATION  Apply 1 mg topically every 6 (six) hours as needed (agitation/anxiety). Ativan Gel 1 mg        Discharge Assessment: Filed Vitals:   06/10/15 1216  BP: 147/68  Pulse: 78  Temp: 98.1 F (36.7 C)  Resp: 16   Skin clean, dry and intact without evidence of skin break down, no evidence of skin tears noted. IV catheter discontinued intact. Site without signs and symptoms of complications - no redness or edema noted at insertion  site, patient denies c/o pain - only slight tenderness at site.  Dressing with slight pressure applied.  D/c Instructions-Education: Discharge instructions given to patient/family with verbalized understanding. D/c education completed with patient/family including follow up instructions, medication list, d/c activities limitations if indicated, with other d/c instructions as indicated by MD - patient able to verbalize understanding, all questions fully answered. Patient instructed to return to ED, call 911, or call MD for any changes in condition.  Patient escorted via EMS to [facility], report called into [facility].   Dayle Points, RN 06/10/2015 1:55 PM

## 2015-06-10 NOTE — Progress Notes (Signed)
Speech Language Pathology Treatment: Dysphagia  Patient Details Name: Tanya White MRN: [id number] DOB: 1935/12/28 Today's Date: 06/10/2015 Time: 0900-0920 SLP Time Calculation (min) (ACUTE ONLY): 20 min  Assessment / Plan / Recommendation Clinical Impression  Pt demonstrates adequate arousal for independent PO intake. She does have mild pocketing of solids in the left buccal cavity during the meal, however she intermittently clears this with liquid wash. She does not respond well to verbal cues due to aphasia. Also observed pt taking pills with water with no difficulty. Recommend pt continue dys 3 texture with thin liquids. No SLP f/u needed unless pt receiving services for cognition and communication prior to admit.    HPI Other Pertinent Information: Mao Lockner is a 79 y.o. femalewith history of seizure, CVA, PE, and dysphagia who was brought to the ER for possible tonic-clonic seizure-like episode. Pt is aphasic/apraxic at baseline, with previous need for PEG due to a cognitively-based dysphagia with concern for adequate nutritional intake despite recommendation for regular diet and thin liquids.   Pertinent Vitals    SLP Plan  Continue with current plan of care    Recommendations Diet recommendations: Dysphagia 3 (mechanical soft);Thin liquid Liquids provided via: Cup Medication Administration: Whole meds with liquid Supervision: Patient able to self feed Compensations: Minimize environmental distractions;Slow rate;Small sips/bites;Follow solids with liquid Postural Changes and/or Swallow Maneuvers: Seated upright 90 degrees              Oral Care Recommendations: Oral care BID Plan: Continue with current plan of care    GO    [facility], MA CCC-SLP [phone]  Lynann Beaver 06/10/2015, 9:26 AM

## 2015-07-04 ENCOUNTER — Ambulatory Visit: Payer: Medicare Other | Admitting: Nurse Practitioner

## 2015-07-16 ENCOUNTER — Ambulatory Visit (INDEPENDENT_AMBULATORY_CARE_PROVIDER_SITE_OTHER): Payer: Medicare Other | Admitting: Neurology

## 2015-07-16 ENCOUNTER — Encounter: Payer: Self-pay | Admitting: Neurology

## 2015-07-16 VITALS — BP 144/77 | HR 68 | Ht <= 58 in | Wt 96.0 lb

## 2015-07-16 DIAGNOSIS — R569 Unspecified convulsions: Secondary | ICD-10-CM

## 2015-07-16 DIAGNOSIS — G9340 Encephalopathy, unspecified: Secondary | ICD-10-CM | POA: Diagnosis not present

## 2015-07-16 DIAGNOSIS — I636 Cerebral infarction due to cerebral venous thrombosis, nonpyogenic: Secondary | ICD-10-CM | POA: Diagnosis not present

## 2015-07-16 MED ORDER — LACOSAMIDE 10 MG/ML PO SOLN: 200.0000 mg | Freq: Two times a day (BID) | ORAL | Status: DC

## 2015-07-16 NOTE — Progress Notes (Signed)
Chief Complaint  Patient presents with  . Seizures    She is here with her nephew, Tanya White.  She has had a recent admission to the hosptial for seizure activity.  Her nephew says she had refused some of her medications prior to her seizure.  She is back at [facility] and has been taking her medications, as prescribed.      [facility]  PATIENT: Tanya White DOB: December 09, 1935   REASON FOR VISIT: Follow-up for history of multiple strokes, complex partial seizure disorder, memory loss  HISTORY FROM: [place] ILLNESS:Tanya White is a 79 years old right-handed African-American female, is accompanied by her Nephew Tanya White for evaluation of worsening functional status, she is currently nursing home resident,    She had a past medical history of stroke in 2012, with residual right-sided weakness, MRI of the brain at that time showed smaller foci of restricted diffusion are seen in the superior right frontal lobe at the level of the centrum semiovale, Multiple infarcts are also seen in the cerebellum, and bilateral frontal lobes. Suggestive of embolic event, Even after that stroke, she lives alone, driving, independent of her living untill June 2015, she suffered acute pancreatitis rupture, with pelvic abscess, she was treated with multiple hospital admission at [facility] prolonged antibiotics,  During that period of time, she also developed complex partial seizure, sounds like partial status, was treated with titrating dose of antiseptic medications, initially with Dilantin, Depakote, later also developed Dilantin toxicity, dizziness, confusion, sleepiness  She was eventually discharged to rehabilitation center at [facility], [place] center, in September 2015 [place] evaluations, Dilantin level was 35, Depakote was less than 10, she was switched over to Keppra, 500 mg twice a day, Vimpat 150 mg twice a day, she has no recurrent  seizure, gradually recovered, much less drowsy She was noted continue have worsening memory trouble, much worse than her baseline, gait difficulty. She is now taking Eliquis, long term anticoagulation, had left thoracentesis for pleural effusion Most recent CAT scan in 2015 has demonstrated no acute lesions, left parietal area chronic stroke, [facility] Video EEG monitoring: This study is consist with moderate cerebral dysfunction and encephalopathy and ongoing left posterior quadrant cortical irritability. Lateralizing epileptiform discharges may be indicative of an underlying structural abnormality.  UPDATE Jan 27th 2016: She was admitted to [facility] in December 1st 2015 for recurrent seizure, concurrent with her PEG tube infection, receiving antibiotics, repeat MRI of the brain chronic hemorrhagic infarct left temporoparietal lobe. Mild chronic microvascular ischemic changes in the white matter. No acute abnormality EEG showed mild generalized encephalopathy. There was no seizure or seizure predisposition recorded on this study.  Peg tube was taken out in December 2015, she is now higher dose of Vimpat 200 mg twice a day, Keppra 750mg  bid, doing well, no recurrent seizure, continue has profound aphasia, memory trouble, mild gait difficulty, bowel and bladder incontinence, falling sometimes. Update 01/06/2015, Ms. 77, 79 year old female returns for follow-up with her nephew. He says she had a seizure about 2 weeks ago and was given IV Dilantin in the nursing home ordered by the medical director. I see no indication OF that on her MAR. she is currently on Vimpat 200 twice daily and Keppra 750 twice daily. She has history of strokes and profound aphasia, she also has weakness on the right side particularly the right upper extremity. She has significant memory loss. She has bowel and bladder incontinence, he  denies that she is had any falls since last seen. She returns for  reevaluation  UPDATE March 19 2015:  She is accompanied by her sister, and her niece at today's clinical visit, she lives at [facility] home since October 2015,  She was taking Keppra 750 mg twice a day, Vimpat 200 milligrams twice a day  She was admitted to the hospital in March 09 2015 for prolonged seizure, which require intubation, there was also question of patient refusing medications, Keppra level was 37.4    EEG March 09 2015 showed irregular generalized slowing, in the range of  Delta range,  She is now discharged home with Keppra  1000 mg twice a day, Vimpat 200 mg twice a day, Depakote 250 +125 mg twice a day   she is almost back to her baseline, still has significant speech difficulty, mild right side weakness.  There was no recurrent seizure  I have reviewed CAT scan of the brain July 2420 16, encephalomalacia involving left posterior frontal, parietal lobe, no acute lesions,  CT angiogram of the neck showed 72% stenosis of right internal carotid artery, 44% stenosis of left internal carotid artery , Right vertebral artery is dominant, ectatic left vertebral artery, with mild to moderate irregularity  UPDATE Sept 8th 2016: She lives at nursing home, she can feed, dress herself, but need assistant sometimes,  She sleeps well, she refuse medications, is agitated sometimes   UPDATE Jul 16 2015: She was admitted to the hospital in October 20 fourth 2016, for acute onset confusion which lasted for about 2-3 days, I have personally reviewed MRI of the brain, old left MCA stroke, extensive atrophy, supratentorium small vessel disease, no acute lesions, no evidence of UTI, she also noncompliance with her medications, there was also possible seizure-like event, EEG showed generalized slowing, no evidence of epileptiform discharge. She was discharged with Depakote, Vimpat, Keppra  REVIEW OF SYSTEMS: Full 14 system review of systems performed and notable only for those listed, all others  are neg:  As above, seizure, speech difficulty   ALLERGIES: Allergies  Allergen Reactions  . Codeine Shortness Of Breath  . Lipitor [Atorvastatin] Diarrhea and Other (See Comments)    GI upset, rhabdomyalosis   . Aspirin Diarrhea and Other (See Comments)    Only ASA 325mg  ; pt can tolerate low dose asa 81mg  Reaction : GI upset   . Hctz [Hydrochlorothiazide] Rash  . Hydralazine Other (See Comments)    Unknown reaction - listed on [facility] 06/08/2015  . Reserpine Other (See Comments)    Unknown reaction - listed on [facility] 06/08/2015  . Tramadol Other (See Comments)    Unknown reaction - listed on [facility] 06/08/2015    HOME MEDICATIONS: Outpatient Prescriptions Prior to Visit  Medication Sig Dispense Refill  . acetaminophen (TYLENOL) 325 MG tablet Take 2 tablets (650 mg total) by mouth every 6 (six) hours as needed for mild pain or fever.    Marland Kitchen albuterol (PROVENTIL) (2.5 MG/3ML) 0.083% nebulizer solution Take 2.5 mg by nebulization every 2 (two) hours as needed for wheezing or shortness of breath.    Marland Kitchen apixaban (ELIQUIS) 2.5 MG TABS tablet Take 1 tablet (2.5 mg total) by mouth 2 (two) times daily. 60 tablet 0  . Cranberry 250 MG TABS Take 250 mg by mouth daily.    . divalproex (DEPAKOTE) 250 MG DR tablet Take 250 mg by mouth 2 (two) times daily.    . famotidine (PEPCID) 20 MG tablet Take 20 mg by mouth  daily.     . ferrous sulfate 325 (65 FE) MG tablet Take 1 tablet (325 mg total) by mouth daily.  3  . furosemide (LASIX) 40 MG tablet Take 1 tablet (40 mg total) by mouth daily as needed (for leg swelling.  Give PRN potassium when Lasix is given). 30 tablet   . lacosamide (VIMPAT) 200 MG TABS tablet Take 1 tablet (200 mg total) by mouth 2 (two) times daily. 60 tablet   . levETIRAcetam (KEPPRA) 1000 MG tablet Take 1,000 mg by mouth 2 (two) times daily.    Marland Kitchen LORazepam (ATIVAN) 2 MG/ML injection Inject 1 mg into the muscle every 6 (six) hours as needed for seizure.    . magnesium oxide (MAG-OX) 400 MG  tablet Take 1 tablet (400 mg total) by mouth daily.    . metoprolol tartrate (LOPRESSOR) 25 MG tablet Take 37.5 mg by mouth 2 (two) times daily.     . ondansetron (ZOFRAN) 4 MG tablet Take 1 tablet (4 mg total) by mouth every 4 (four) hours as needed for nausea. 20 tablet 0  . potassium chloride SA (KLOR-CON M20) 20 MEQ tablet Take 1 tablet (20 mEq total) by mouth daily as needed (whenever she is given Lasix). (Patient taking differently: Take 20 mEq by mouth daily as needed (whenever lasix is administered). )    . PRESCRIPTION MEDICATION Apply 1 mg topically every 6 (six) hours as needed (agitation/anxiety). Ativan Gel 1 mg     No facility-administered medications prior to visit.    PAST MEDICAL HISTORY: Past Medical History  Diagnosis Date  . Seizures ([employer])   . Hypotension   . Pneumonia   . Stroke ([place])   . GI bleed   . Pulmonary embolism ([place])   . Gastrostomy tube in place [facility])   . Memory loss   . TTP (thrombotic thrombocytopenic purpura) (HCC)     Treated with PLEX in 2012 at [facility]: Past Surgical History  Procedure Laterality Date  . Appendectomy    . Jejunostomy feeding tube      FAMILY HISTORY: Family History  Problem Relation Age of Onset  . High blood pressure Mother   . Cancer    . Stroke      SOCIAL HISTORY: Social History   Social History  . Marital Status: Single    Spouse Name: N/A  . Number of Children: 2  . Years of Education: N/A   Occupational History  .      Retired   Social History Main Topics  . Smoking status: Former Smoker -- 0.50 packs/day for 15 years  . Smokeless tobacco: Never Used  . Alcohol Use: No  . Drug Use: No  . Sexual Activity: Not on file   Other Topics Concern  . Not on file   Social History Narrative   Patient lives in [place] .   Patient is retired.   Caffeine one cup daily.     PHYSICAL EXAM  Filed Vitals:   07/16/15 1428  BP: 144/77  Pulse: 68  Height: 4\' 9"  (1.448 m)   Weight: 96 lb (43.545 kg)   Body mass index is 20.77 kg/(m^2).   PHYSICAL EXAMNIATION:  Gen: NAD, conversant, well nourised, obese, well groomed                     Cardiovascular: Regular rate rhythm, no peripheral edema, warm, nontender. Eyes: Conjunctivae clear without exudates or hemorrhage Neck: Supple, no  carotid bruise. Pulmonary: Clear to auscultation bilaterally   NEUROLOGICAL EXAM:  MENTAL STATUS: Speech  /cognition:  she has significant comprehensive, and expressive aphasia , not cooperative on Mini-Mental Status examination, has right hemi-neglect, right hemi-visual field cut  CRANIAL NERVES: CN II:  Right visual field deficit. Pupils are round equal and briskly reactive to light. CN III, IV, VI: extraocular movement are normal. No ptosis. CN V: Facial sensation is intact to pinprick in all 3 divisions bilaterally. Corneal responses are intact.  CN VII: Face is symmetric with normal eye closure and smile. CN VIII: Hearing is normal to rubbing fingers CN IX, X: Palate elevates symmetrically. Phonation is normal. CN XI: Head turning and shoulder shrug are intact CN XII: Tongue is midline with normal movements and no atrophy.  MOTOR: She tends to hold her right hand in elbow flexion pronation, wrist flexion, finger flexion, only mild weakness in right arm, able to move against gravity without difficulty.  REFLEXES: Reflexes are 2+ and symmetric at the biceps, triceps, knees, and ankles. Plantar responses are flexor.  SENSORY:  right hemi-neglect  COORDINATION: Rapid alternating movements and fine finger movements are intact. There is no dysmetria on finger-to-nose and heel-knee-shin.    GAIT/STANCE:  mildly unsteady dragging her right side   DIAGNOSTIC DATA (LABS, IMAGING, TESTING) - I reviewed patient records, labs, notes, testing and imaging myself where available.   ASSESSMENT AND PLAN  79 y.o. year old female    Left MCA stroke   she is on   anticoagulation  Eliquis  Complex partial seizure with secondary generalization  She was on polypharmacy, Vimpat, Depakote, Keppra but refuse medications sometimes  I have changed her to Vimpat liquid form 200 mg twice a day Dementia  Vascular component    Marcial Pacas, M.D. Ph.D.  [facility] [address], [place] [address] Phone: [phone] Fax:      [phone]

## 2015-07-24 ENCOUNTER — Telehealth: Payer: Self-pay | Admitting: Neurology

## 2015-07-24 MED ORDER — LACOSAMIDE 200 MG PO TABS: 200.0000 mg | ORAL_TABLET | Freq: Two times a day (BID) | ORAL | Status: AC

## 2015-07-24 NOTE — Telephone Encounter (Signed)
Spoke to [place] at [facility] - stated patient is refusing liquid medication and does better with the pill form of Vimpat.  She has been restarted Depakote 250mg , BID for her behavioral issues (by the PCP at [facility]).  They want to know if you will provide a new Vimpat rx in tablet form.  Also, since she was already on Vimpat 200mg , BID along with the Keppra, did you want to increase the dosage since her Keppra has been discontinued.

## 2015-07-24 NOTE — Telephone Encounter (Signed)
Claiborne Billings with [facility] called sts lacosamide (VIMPAT) 10 MG/ML oral solution pt refused to take liquid. Claiborne Billings is requesting Vimpat in pill form but she says the liquid was 28ml day, she was already taking that in the pill form and pt's nephew thought Dr Krista Blue wanted to increase Vimpat so she would be on 1 seizure medication. Did Dr Krista Blue want the Vimpat increased and to start [place] back also?  Dr at [facility] started her back on depakote for behavioral issues not seizures.  Claiborne Billings can be reached at [phone].

## 2015-07-24 NOTE — Telephone Encounter (Signed)
Spoke with Claiborne Billings, RN responsible for Sheli's care at [facility]  - she has been given Dr. Rhea Belton orders below, verbally - she verbalized understanding.

## 2015-07-24 NOTE — Telephone Encounter (Signed)
Agree with Depakote 250 mg twice a day for behavior control, I restarted Vimpat 200 mg tablet 1 tablet twice a day, I have stopped the Keppra

## 2015-08-18 ENCOUNTER — Emergency Department (HOSPITAL_COMMUNITY): Payer: Medicare Other

## 2015-08-18 ENCOUNTER — Encounter (HOSPITAL_COMMUNITY): Payer: Self-pay

## 2015-08-18 ENCOUNTER — Inpatient Hospital Stay (HOSPITAL_COMMUNITY)
Admission: EM | Admit: 2015-08-18 | Discharge: 2015-08-24 | DRG: 956 | Disposition: A | Discharge status: Skilled Nursing Facility | Payer: Medicare Other | Attending: Internal Medicine | Admitting: Internal Medicine

## 2015-08-18 DIAGNOSIS — Z886 Allergy status to analgesic agent status: Secondary | ICD-10-CM

## 2015-08-18 DIAGNOSIS — D638 Anemia in other chronic diseases classified elsewhere: Secondary | ICD-10-CM | POA: Diagnosis present

## 2015-08-18 DIAGNOSIS — R569 Unspecified convulsions: Secondary | ICD-10-CM | POA: Diagnosis not present

## 2015-08-18 DIAGNOSIS — G40909 Epilepsy, unspecified, not intractable, without status epilepticus: Secondary | ICD-10-CM | POA: Diagnosis present

## 2015-08-18 DIAGNOSIS — R7989 Other specified abnormal findings of blood chemistry: Secondary | ICD-10-CM | POA: Diagnosis present

## 2015-08-18 DIAGNOSIS — N390 Urinary tract infection, site not specified: Secondary | ICD-10-CM | POA: Diagnosis not present

## 2015-08-18 DIAGNOSIS — D62 Acute posthemorrhagic anemia: Secondary | ICD-10-CM | POA: Diagnosis not present

## 2015-08-18 DIAGNOSIS — W19XXXA Unspecified fall, initial encounter: Secondary | ICD-10-CM | POA: Diagnosis present

## 2015-08-18 DIAGNOSIS — R32 Unspecified urinary incontinence: Secondary | ICD-10-CM | POA: Diagnosis not present

## 2015-08-18 DIAGNOSIS — Z931 Gastrostomy status: Secondary | ICD-10-CM | POA: Diagnosis not present

## 2015-08-18 DIAGNOSIS — B964 Proteus (mirabilis) (morganii) as the cause of diseases classified elsewhere: Secondary | ICD-10-CM | POA: Diagnosis present

## 2015-08-18 DIAGNOSIS — Z79899 Other long term (current) drug therapy: Secondary | ICD-10-CM | POA: Diagnosis not present

## 2015-08-18 DIAGNOSIS — Z8249 Family history of ischemic heart disease and other diseases of the circulatory system: Secondary | ICD-10-CM | POA: Diagnosis not present

## 2015-08-18 DIAGNOSIS — S32591A Other specified fracture of right pubis, initial encounter for closed fracture: Secondary | ICD-10-CM

## 2015-08-18 DIAGNOSIS — S72141A Displaced intertrochanteric fracture of right femur, initial encounter for closed fracture: Principal | ICD-10-CM | POA: Diagnosis present

## 2015-08-18 DIAGNOSIS — Z8673 Personal history of transient ischemic attack (TIA), and cerebral infarction without residual deficits: Secondary | ICD-10-CM

## 2015-08-18 DIAGNOSIS — Z7901 Long term (current) use of anticoagulants: Secondary | ICD-10-CM | POA: Diagnosis not present

## 2015-08-18 DIAGNOSIS — I700 Atherosclerosis of aorta: Secondary | ICD-10-CM | POA: Diagnosis present

## 2015-08-18 DIAGNOSIS — Z888 Allergy status to other drugs, medicaments and biological substances status: Secondary | ICD-10-CM

## 2015-08-18 DIAGNOSIS — I2699 Other pulmonary embolism without acute cor pulmonale: Secondary | ICD-10-CM | POA: Diagnosis present

## 2015-08-18 DIAGNOSIS — I639 Cerebral infarction, unspecified: Secondary | ICD-10-CM | POA: Diagnosis present

## 2015-08-18 DIAGNOSIS — N179 Acute kidney failure, unspecified: Secondary | ICD-10-CM | POA: Diagnosis present

## 2015-08-18 DIAGNOSIS — S32501S Unspecified fracture of right pubis, sequela: Secondary | ICD-10-CM | POA: Diagnosis not present

## 2015-08-18 DIAGNOSIS — Z823 Family history of stroke: Secondary | ICD-10-CM

## 2015-08-18 DIAGNOSIS — F0390 Unspecified dementia without behavioral disturbance: Secondary | ICD-10-CM | POA: Diagnosis present

## 2015-08-18 DIAGNOSIS — S32599A Other specified fracture of unspecified pubis, initial encounter for closed fracture: Secondary | ICD-10-CM | POA: Diagnosis present

## 2015-08-18 DIAGNOSIS — S72001D Fracture of unspecified part of neck of right femur, subsequent encounter for closed fracture with routine healing: Secondary | ICD-10-CM | POA: Diagnosis not present

## 2015-08-18 DIAGNOSIS — I6930 Unspecified sequelae of cerebral infarction: Secondary | ICD-10-CM | POA: Diagnosis not present

## 2015-08-18 DIAGNOSIS — I2782 Chronic pulmonary embolism: Secondary | ICD-10-CM

## 2015-08-18 DIAGNOSIS — Y92122 Bedroom in nursing home as the place of occurrence of the external cause: Secondary | ICD-10-CM | POA: Diagnosis not present

## 2015-08-18 DIAGNOSIS — W010XXA Fall on same level from slipping, tripping and stumbling without subsequent striking against object, initial encounter: Secondary | ICD-10-CM | POA: Diagnosis present

## 2015-08-18 DIAGNOSIS — Z86711 Personal history of pulmonary embolism: Secondary | ICD-10-CM

## 2015-08-18 DIAGNOSIS — D509 Iron deficiency anemia, unspecified: Secondary | ICD-10-CM | POA: Diagnosis present

## 2015-08-18 DIAGNOSIS — S72001A Fracture of unspecified part of neck of right femur, initial encounter for closed fracture: Secondary | ICD-10-CM

## 2015-08-18 DIAGNOSIS — M25551 Pain in right hip: Secondary | ICD-10-CM | POA: Diagnosis present

## 2015-08-18 DIAGNOSIS — Z87891 Personal history of nicotine dependence: Secondary | ICD-10-CM

## 2015-08-18 DIAGNOSIS — Z885 Allergy status to narcotic agent status: Secondary | ICD-10-CM

## 2015-08-18 DIAGNOSIS — S32810A Multiple fractures of pelvis with stable disruption of pelvic ring, initial encounter for closed fracture: Secondary | ICD-10-CM | POA: Diagnosis not present

## 2015-08-18 DIAGNOSIS — T148XXA Other injury of unspecified body region, initial encounter: Secondary | ICD-10-CM

## 2015-08-18 DIAGNOSIS — D696 Thrombocytopenia, unspecified: Secondary | ICD-10-CM | POA: Diagnosis not present

## 2015-08-18 DIAGNOSIS — Z09 Encounter for follow-up examination after completed treatment for conditions other than malignant neoplasm: Secondary | ICD-10-CM

## 2015-08-18 HISTORY — DX: Lobar pneumonia, unspecified organism: J18.1

## 2015-08-18 HISTORY — DX: Poisoning by hydantoin derivatives, accidental (unintentional), initial encounter: T42.0X1A

## 2015-08-18 HISTORY — DX: Acute respiratory failure, unspecified whether with hypoxia or hypercapnia: J96.00

## 2015-08-18 LAB — CBC WITH DIFFERENTIAL/PLATELET
BASOS ABS: 0 10*3/uL (ref 0.0–0.1)
BASOS PCT: 0 %
EOS ABS: 0.1 10*3/uL (ref 0.0–0.7)
EOS PCT: 1 %
HCT: 34.5 % — ABNORMAL LOW (ref 36.0–46.0)
Hemoglobin: 11.9 g/dL — ABNORMAL LOW (ref 12.0–15.0)
LYMPHS PCT: 11 %
Lymphs Abs: 1 10*3/uL (ref 0.7–4.0)
MCH: 30 pg (ref 26.0–34.0)
MCHC: 34.5 g/dL (ref 30.0–36.0)
MCV: 86.9 fL (ref 78.0–100.0)
MONO ABS: 0.8 10*3/uL (ref 0.1–1.0)
Monocytes Relative: 9 %
Neutro Abs: 7.2 10*3/uL (ref 1.7–7.7)
Neutrophils Relative %: 79 %
PLATELETS: 156 10*3/uL (ref 150–400)
RBC: 3.97 MIL/uL (ref 3.87–5.11)
RDW: 14.6 % (ref 11.5–15.5)
WBC: 9.1 10*3/uL (ref 4.0–10.5)

## 2015-08-18 LAB — APTT: APTT: 29 s (ref 24–37)

## 2015-08-18 LAB — URINE MICROSCOPIC-ADD ON

## 2015-08-18 LAB — COMPREHENSIVE METABOLIC PANEL
ALBUMIN: 4 g/dL (ref 3.5–5.0)
ALK PHOS: 51 U/L (ref 38–126)
ALT: 14 U/L (ref 14–54)
ANION GAP: 9 (ref 5–15)
AST: 25 U/L (ref 15–41)
BILIRUBIN TOTAL: 0.6 mg/dL (ref 0.3–1.2)
BUN: 26 mg/dL — ABNORMAL HIGH (ref 6–20)
CALCIUM: 8.9 mg/dL (ref 8.9–10.3)
CO2: 27 mmol/L (ref 22–32)
Chloride: 103 mmol/L (ref 101–111)
Creatinine, Ser: 1.21 mg/dL — ABNORMAL HIGH (ref 0.44–1.00)
GFR calc Af Amer: 49 mL/min — ABNORMAL LOW (ref >60)
GFR, EST NON AFRICAN AMERICAN: 42 mL/min — AB (ref >60)
GLUCOSE: 148 mg/dL — AB (ref 65–99)
POTASSIUM: 4.4 mmol/L (ref 3.5–5.1)
Sodium: 139 mmol/L (ref 135–145)
TOTAL PROTEIN: 7.2 g/dL (ref 6.5–8.1)

## 2015-08-18 LAB — URINALYSIS, ROUTINE W REFLEX MICROSCOPIC
BILIRUBIN URINE: NEGATIVE
GLUCOSE, UA: NEGATIVE mg/dL
KETONES UR: 15 mg/dL — AB
Leukocytes, UA: NEGATIVE
Nitrite: NEGATIVE
PH: 6 (ref 5.0–8.0)
Protein, ur: NEGATIVE mg/dL
SPECIFIC GRAVITY, URINE: 1.019 (ref 1.005–1.030)

## 2015-08-18 LAB — ABO/RH: ABO/RH(D): B POS

## 2015-08-18 LAB — PROTIME-INR
INR: 1.61 — AB (ref 0.00–1.49)
PROTHROMBIN TIME: 19.2 s — AB (ref 11.6–15.2)

## 2015-08-18 LAB — HEPARIN LEVEL (UNFRACTIONATED): Heparin Unfractionated: 1.66 IU/mL — ABNORMAL HIGH (ref 0.30–0.70)

## 2015-08-18 LAB — I-STAT TROPONIN, ED: TROPONIN I, POC: 0 ng/mL (ref 0.00–0.08)

## 2015-08-18 MED ORDER — BISACODYL 10 MG RE SUPP: 10.0000 mg | Freq: Every day | RECTAL | Status: DC | PRN

## 2015-08-18 MED ORDER — ONDANSETRON HCL 4 MG/2ML IJ SOLN: 4.0000 mg | Freq: Three times a day (TID) | INTRAMUSCULAR | Status: DC | PRN

## 2015-08-18 MED ORDER — FAMOTIDINE 20 MG PO TABS
20.0000 mg | ORAL_TABLET | Freq: Every day | ORAL | Status: DC
  Administered 2015-08-19 – 2015-08-24 (×6): 20 mg via ORAL
  Filled 2015-08-18 (×6): qty 1

## 2015-08-18 MED ORDER — TRIMETHOPRIM 100 MG PO TABS
100.0000 mg | ORAL_TABLET | Freq: Every day | ORAL | Status: DC
  Administered 2015-08-19 – 2015-08-20 (×2): 100 mg via ORAL
  Filled 2015-08-18 (×2): qty 1

## 2015-08-18 MED ORDER — METOPROLOL TARTRATE 25 MG PO TABS
37.5000 mg | ORAL_TABLET | Freq: Two times a day (BID) | ORAL | Status: DC
  Administered 2015-08-19 – 2015-08-24 (×8): 37.5 mg via ORAL
  Filled 2015-08-18 (×13): qty 1

## 2015-08-18 MED ORDER — MORPHINE SULFATE (PF) 4 MG/ML IV SOLN: 4.0000 mg | INTRAVENOUS | Status: DC | PRN

## 2015-08-18 MED ORDER — SODIUM CHLORIDE 0.9 % IV SOLN
INTRAVENOUS | Status: DC
  Administered 2015-08-19 – 2015-08-20 (×2): via INTRAVENOUS

## 2015-08-18 MED ORDER — DIVALPROEX SODIUM 250 MG PO DR TAB
250.0000 mg | DELAYED_RELEASE_TABLET | Freq: Two times a day (BID) | ORAL | Status: DC
  Administered 2015-08-19 – 2015-08-24 (×10): 250 mg via ORAL
  Filled 2015-08-18 (×14): qty 1

## 2015-08-18 MED ORDER — ACETAMINOPHEN 325 MG PO TABS
650.0000 mg | ORAL_TABLET | Freq: Four times a day (QID) | ORAL | Status: DC | PRN
  Administered 2015-08-19 – 2015-08-24 (×8): 650 mg via ORAL
  Filled 2015-08-18 (×9): qty 2

## 2015-08-18 MED ORDER — FERROUS SULFATE 325 (65 FE) MG PO TABS
325.0000 mg | ORAL_TABLET | Freq: Every day | ORAL | Status: DC
  Administered 2015-08-19 – 2015-08-24 (×6): 325 mg via ORAL
  Filled 2015-08-18 (×6): qty 1

## 2015-08-18 MED ORDER — ACETAMINOPHEN 650 MG RE SUPP: 650.0000 mg | Freq: Four times a day (QID) | RECTAL | Status: DC | PRN

## 2015-08-18 MED ORDER — ONDANSETRON HCL 4 MG/2ML IJ SOLN: 4.0000 mg | Freq: Four times a day (QID) | INTRAMUSCULAR | Status: DC | PRN

## 2015-08-18 MED ORDER — HEPARIN (PORCINE) IN NACL 100-0.45 UNIT/ML-% IJ SOLN
600.0000 [IU]/h | INTRAMUSCULAR | Status: DC
  Administered 2015-08-18: 600 [IU]/h via INTRAVENOUS
  Filled 2015-08-18: qty 250

## 2015-08-18 MED ORDER — POLYETHYLENE GLYCOL 3350 17 G PO PACK
17.0000 g | PACK | Freq: Every day | ORAL | Status: DC
  Administered 2015-08-19 – 2015-08-22 (×3): 17 g via ORAL
  Filled 2015-08-18 (×3): qty 1

## 2015-08-18 MED ORDER — METHOCARBAMOL 500 MG PO TABS
500.0000 mg | ORAL_TABLET | Freq: Four times a day (QID) | ORAL | Status: DC | PRN
  Filled 2015-08-18: qty 1

## 2015-08-18 MED ORDER — MORPHINE SULFATE (PF) 2 MG/ML IV SOLN
0.5000 mg | INTRAVENOUS | Status: DC | PRN
  Administered 2015-08-20: 0.5 mg via INTRAVENOUS
  Filled 2015-08-18: qty 1

## 2015-08-18 MED ORDER — HYDROCODONE-ACETAMINOPHEN 5-325 MG PO TABS: 1.0000 | ORAL_TABLET | Freq: Four times a day (QID) | ORAL | Status: DC | PRN

## 2015-08-18 MED ORDER — ALUM & MAG HYDROXIDE-SIMETH 200-200-20 MG/5ML PO SUSP: 30.0000 mL | Freq: Four times a day (QID) | ORAL | Status: DC | PRN

## 2015-08-18 MED ORDER — HYDROCODONE-ACETAMINOPHEN 5-325 MG PO TABS: 1.0000 | ORAL_TABLET | ORAL | Status: DC | PRN

## 2015-08-18 MED ORDER — LACOSAMIDE 50 MG PO TABS
200.0000 mg | ORAL_TABLET | Freq: Two times a day (BID) | ORAL | Status: DC
  Administered 2015-08-19 – 2015-08-24 (×10): 200 mg via ORAL
  Filled 2015-08-18 (×11): qty 4

## 2015-08-18 MED ORDER — ONDANSETRON HCL 4 MG PO TABS: 4.0000 mg | ORAL_TABLET | Freq: Four times a day (QID) | ORAL | Status: DC | PRN

## 2015-08-18 MED ORDER — ALBUTEROL SULFATE (2.5 MG/3ML) 0.083% IN NEBU: 2.5000 mg | INHALATION_SOLUTION | RESPIRATORY_TRACT | Status: DC | PRN

## 2015-08-18 MED ORDER — METHOCARBAMOL 1000 MG/10ML IJ SOLN
500.0000 mg | Freq: Four times a day (QID) | INTRAVENOUS | Status: DC | PRN
  Filled 2015-08-18: qty 5

## 2015-08-18 MED ORDER — LORAZEPAM 2 MG/ML IJ SOLN
1.0000 mg | Freq: Four times a day (QID) | INTRAMUSCULAR | Status: DC | PRN
  Administered 2015-08-20: 1 mg via INTRAVENOUS
  Filled 2015-08-18: qty 1

## 2015-08-18 MED ORDER — MAGNESIUM OXIDE 400 (241.3 MG) MG PO TABS
400.0000 mg | ORAL_TABLET | Freq: Every day | ORAL | Status: DC
  Administered 2015-08-19 – 2015-08-24 (×6): 400 mg via ORAL
  Filled 2015-08-18 (×6): qty 1

## 2015-08-18 NOTE — ED Notes (Signed)
Pt can go to flo

## 2015-08-18 NOTE — ED Notes (Signed)
Nurse drawing labs. 

## 2015-08-18 NOTE — ED Notes (Signed)
Seizure pads put in place 

## 2015-08-18 NOTE — ED Notes (Signed)
Bed: [facility] Expected date:  Expected time:  Means of arrival:  Comments: EMS fall  

## 2015-08-18 NOTE — ED Provider Notes (Signed)
CSN: [id number]     Arrival date & time 08/18/15  1233 History   First MD Initiated Contact with Patient 08/18/15 1250     Chief Complaint  Patient presents with  . Fall  . Hip Pain     (Consider location/radiation/quality/duration/timing/severity/associated sxs/prior Treatment) Patient is a 80 y.o. female presenting with fall and hip pain. The history is provided by the patient and medical records.  Fall  Hip Pain    Level V caveat: Dementia This is a 80 year old female with history of seizures, stroke, PE, dementia, presenting to the ED after a witnessed mechanical fall. Patient trips while walking around the corner of her bed and fell. No head injury loss of consciousness. Staff members tried to move patient's legs, however she began complaining of bilateral hip pain so EMS was called. Staff reports patient is neurologically at her baseline. Patient does take Eliquis for her hx of PE.  VSS.  Past Medical History  Diagnosis Date  . Seizures ([place])   . Hypotension   . Pneumonia   . Stroke ([place])   . GI bleed   . Pulmonary embolism ([place])   . Gastrostomy tube in place [facility])   . Memory loss   . TTP (thrombotic thrombocytopenic purpura) (HCC)     Treated with PLEX in 2012 at [facility]   Past Surgical History  Procedure Laterality Date  . Appendectomy    . Jejunostomy feeding tube     Family History  Problem Relation Age of Onset  . High blood pressure Mother   . Cancer    . Stroke     Social History  Substance Use Topics  . Smoking status: Former Smoker -- 0.50 packs/day for 15 years  . Smokeless tobacco: Never Used  . Alcohol Use: No   OB History    No data available     Review of Systems  Unable to perform ROS: Dementia      Allergies  Codeine; Lipitor; Aspirin; Hctz; Hydralazine; Reserpine; and Tramadol  Home Medications   Prior to Admission medications   Medication Sig Start Date End Date Taking? Authorizing Provider  acetaminophen (TYLENOL) 325 MG tablet  Take 2 tablets (650 mg total) by mouth every 6 (six) hours as needed for mild pain or fever. 07/22/14   Bonnielee Haff, MD  albuterol (PROVENTIL) (2.5 MG/3ML) 0.083% nebulizer solution Take 2.5 mg by nebulization every 2 (two) hours as needed for wheezing or shortness of breath.    Historical Provider, MD  apixaban (ELIQUIS) 2.5 MG TABS tablet Take 1 tablet (2.5 mg total) by mouth 2 (two) times daily. 06/10/15   Nita Sells, MD  Cranberry 250 MG TABS Take 250 mg by mouth daily.    Historical Provider, MD  famotidine (PEPCID) 20 MG tablet Take 20 mg by mouth daily.     Historical Provider, MD  ferrous sulfate 325 (65 FE) MG tablet Take 1 tablet (325 mg total) by mouth daily. 07/22/14   Bonnielee Haff, MD  furosemide (LASIX) 40 MG tablet Take 1 tablet (40 mg total) by mouth daily as needed (for leg swelling.  Give PRN potassium when Lasix is given). 07/22/14   Bonnielee Haff, MD  lacosamide (VIMPAT) 200 MG TABS tablet Take 1 tablet (200 mg total) by mouth 2 (two) times daily. 07/24/15   Marcial Pacas, MD  LORazepam (ATIVAN) 2 MG/ML injection Inject 1 mg into the muscle every 6 (six) hours as needed for seizure.    Historical Provider, MD  magnesium oxide (MAG-OX)  400 MG tablet Take 1 tablet (400 mg total) by mouth daily. 03/12/15   Marijean Heath, NP  metoprolol tartrate (LOPRESSOR) 25 MG tablet Take 37.5 mg by mouth 2 (two) times daily.     Historical Provider, MD  ondansetron (ZOFRAN) 4 MG tablet Take 1 tablet (4 mg total) by mouth every 4 (four) hours as needed for nausea. 07/22/14   Bonnielee Haff, MD  potassium chloride SA (KLOR-CON M20) 20 MEQ tablet Take 1 tablet (20 mEq total) by mouth daily as needed (whenever she is given Lasix). Patient taking differently: Take 20 mEq by mouth daily as needed (whenever lasix is administered).  07/22/14   Bonnielee Haff, MD  PRESCRIPTION MEDICATION Apply 1 mg topically every 6 (six) hours as needed (agitation/anxiety). Ativan Gel 1 mg    Historical Provider,  MD  trimethoprim (TRIMPEX) 100 MG tablet Take 100 mg by mouth 2 (two) times daily.    Historical Provider, MD   BP 175/67 mmHg  Pulse 63  Temp(Src) 98.4 F (36.9 C) (Oral)  Resp 18  SpO2 96%   Physical Exam  Constitutional: She appears well-developed and well-nourished. No distress.  HENT:  Head: Normocephalic and atraumatic.  Mouth/Throat: Oropharynx is clear and moist.  No visible signs of head trauma  Eyes: Conjunctivae and EOM are normal. Pupils are equal, round, and reactive to light.  Neck: Normal range of motion.  Cardiovascular: Normal rate, regular rhythm and normal heart sounds.   Pulmonary/Chest: Effort normal and breath sounds normal.  Abdominal: Soft. Bowel sounds are normal.  Musculoskeletal: Normal range of motion.  Appreciable tenderness of left hip, however reports pain in right hip; right hip is externally rotated, patient cannot straighten leg due to pain; DP pulses intact  Neurological: She is alert.  Skin: Skin is warm. She is not diaphoretic.  Psychiatric: She has a normal mood and affect.  Nursing note and vitals reviewed.   ED Course  Procedures (including critical care time) Labs Review Labs Reviewed  CBC WITH DIFFERENTIAL/PLATELET - Abnormal; Notable for the following:    Hemoglobin 11.9 (*)    HCT 34.5 (*)    All other components within normal limits  PROTIME-INR - Abnormal; Notable for the following:    Prothrombin Time 19.2 (*)    INR 1.61 (*)    All other components within normal limits  COMPREHENSIVE METABOLIC PANEL - Abnormal; Notable for the following:    Glucose, Bld 148 (*)    BUN 26 (*)    Creatinine, Ser 1.21 (*)    GFR calc non Af Amer 42 (*)    GFR calc Af Amer 49 (*)    All other components within normal limits  URINE CULTURE  URINALYSIS, ROUTINE W REFLEX MICROSCOPIC (NOT AT [facility])  I-STAT TROPOININ, ED  TYPE AND SCREEN  ABO/RH    Imaging Review Dg Chest 1 View  08/18/2015  CLINICAL DATA:  Preop EXAM: CHEST 1 VIEW  COMPARISON:  06/08/2015 FINDINGS: Moderate cardiomegaly. Aorta is tortuous with atherosclerotic calcification. No consolidation or lung mass. Bibasilar nodular densities are likely nipple shadows. IMPRESSION: Cardiomegaly without decompensation. Electronically Signed   By: Marybelle Killings M.D.   On: 08/18/2015 13:29   Dg Hip Unilat With Pelvis 2-3 Views Right  08/18/2015  CLINICAL DATA:  Acute right hip pain following fall today. Initial encounter. EXAM: DG HIP (WITH OR WITHOUT PELVIS) 2-3V RIGHT COMPARISON:  None. FINDINGS: A comminuted intertrochanteric fracture of the proximal right femur is noted with varus angulation. There is no  evidence of subluxation or dislocation. Fractures of the superior and inferior right pubic rami are also noted. Degenerative changes in the left hip are present. IMPRESSION: Comminuted intertrochanteric right femur fracture with varus angulation. Superior and inferior right pubic rami fractures. Electronically Signed   By: Margarette Canada M.D.   On: 08/18/2015 13:28   I have personally reviewed and evaluated these images and lab results as part of my medical decision-making.   EKG Interpretation None      MDM   Final diagnoses:  Closed right hip fracture, initial encounter ([place])  Fracture of multiple pubic rami, right, closed, initial encounter [facility])   80 y.o. female here after a witnessed fall earlier today. No head injury or loss of consciousness. Patient with complaint of hip pain. Patient's vital signs are stable. She has tenderness of her left hip, but points to her right hip as source of pain. Her right hip is externally rotated and she is unable to straighten leg. Her legs are neurovascularly intact.  She has no evidence of head trauma on exam.  Patient is oriented to her baseline per nursing staff and nephew at bedside.  X-ray confirms right hip fracture as well as superior and inferior pubic rami fractures. Orthopedics was consulted, Dr. Lyla Glassing-- plan for operative  repair of hip in 48-72 hours given her Eliquis.  CT pelvis pending. Patient admitted to medicine service for further management.  Larene Pickett, PA-C 08/18/15 1535  Harvel Quale, MD 08/19/15 [phone]

## 2015-08-18 NOTE — Consult Note (Signed)
Tanya White is an 80 y.o. female.    Chief Complaint: right hip pain   HPI: 80 y/o female current resident at [facility] with ground level fall earlier today. C/o immediate pain to right hip/pelvis and inability to bear weight. Son states she has hx of recent falls. Pt c/o mild to moderate pain to right hip. Denies any other injuries. Currently stable. Pt currently taking eliquis.  PCP:  [name], MD  PMH: Past Medical History  Diagnosis Date  . Seizures ([place])   . Hypotension   . Pneumonia   . Stroke ([place])   . GI bleed   . Pulmonary embolism ([place])   . Gastrostomy tube in place [facility])   . Memory loss   . TTP (thrombotic thrombocytopenic purpura) (HCC)     Treated with PLEX in 2012 at [facility]: Past Surgical History  Procedure Laterality Date  . Appendectomy    . Jejunostomy feeding tube      Social History:  reports that she has quit smoking. She has never used smokeless tobacco. She reports that she does not drink alcohol or use illicit drugs.  Allergies:  Allergies  Allergen Reactions  . Codeine Shortness Of Breath  . Lipitor [Atorvastatin] Diarrhea and Other (See Comments)    GI upset, rhabdomyalosis   . Aspirin Diarrhea and Other (See Comments)    Only ASA 355m ; pt can tolerate low dose asa 837mReaction : GI upset   . Hctz [Hydrochlorothiazide] Rash  . Hydralazine Other (See Comments)    [facility]  . Reserpine Other (See Comments)    [facility]  . Tramadol Other (See Comments)    [facility]    Medications: No current facility-administered medications for this encounter.   Current Outpatient Prescriptions  Medication Sig Dispense Refill  . acetaminophen (TYLENOL) 325 MG tablet Take 2 tablets (650 mg total) by mouth every 6 (six) hours as needed for mild pain or fever.    . Marland Kitchenlbuterol (PROVENTIL) (2.5 MG/3ML) 0.083% nebulizer solution  Take 2.5 mg by nebulization every 2 (two) hours as needed for wheezing or shortness of breath.    . Marland Kitchenpixaban (ELIQUIS) 2.5 MG TABS tablet Take 1 tablet (2.5 mg total) by mouth 2 (two) times daily. 60 tablet 0  . Cranberry 250 MG TABS Take 250 mg by mouth daily.    . divalproex (DEPAKOTE) 250 MG DR tablet Take 250 mg by mouth 2 (two) times daily.    . famotidine (PEPCID) 20 MG tablet Take 20 mg by mouth daily.     . ferrous sulfate 325 (65 FE) MG tablet Take 1 tablet (325 mg total) by mouth daily.  3  . furosemide (LASIX) 40 MG tablet Take 1 tablet (40 mg total) by mouth daily as needed (for leg swelling.  Give PRN potassium when Lasix is given). 30 tablet   . lacosamide (VIMPAT) 200 MG TABS tablet Take 1 tablet (200 mg total) by mouth 2 (two) times daily. 60 tablet 11  . LORazepam (ATIVAN) 2 MG/ML injection Inject 1 mg into the muscle every 6 (six) hours as needed for seizure.    . magnesium oxide (MAG-OX) 400 MG tablet Take 1 tablet (400 mg total) by mouth daily.    . metoprolol tartrate (LOPRESSOR) 25 MG tablet Take 37.5 mg by mouth 2 (two) times daily.     . ondansetron (ZOFRAN) 4 MG tablet  Take 1 tablet (4 mg total) by mouth every 4 (four) hours as needed for nausea. 20 tablet 0  . potassium chloride SA (KLOR-CON M20) 20 MEQ tablet Take 1 tablet (20 mEq total) by mouth daily as needed (whenever she is given Lasix). (Patient taking differently: Take 20 mEq by mouth daily as needed (whenever lasix is administered). )    . PRESCRIPTION MEDICATION Apply 1 mg topically every 6 (six) hours as needed (agitation/anxiety). Ativan Gel 1 mg    . trimethoprim (TRIMPEX) 100 MG tablet Take 100 mg by mouth daily.       Results for orders placed or performed during the hospital encounter of 08/18/15 (from the past 48 hour(s))  CBC WITH DIFFERENTIAL     Status: Abnormal   Collection Time: 08/18/15  1:47 PM  Result Value Ref Range   WBC 9.1 4.0 - 10.5 K/uL   RBC 3.97 3.87 - 5.11 MIL/uL   Hemoglobin 11.9 (L)  12.0 - 15.0 g/dL   HCT 34.5 (L) 36.0 - 46.0 %   MCV 86.9 78.0 - 100.0 fL   MCH 30.0 26.0 - 34.0 pg   MCHC 34.5 30.0 - 36.0 g/dL   RDW 14.6 11.5 - 15.5 %   Platelets 156 150 - 400 K/uL   Neutrophils Relative % 79 %   Neutro Abs 7.2 1.7 - 7.7 K/uL   Lymphocytes Relative 11 %   Lymphs Abs 1.0 0.7 - 4.0 K/uL   Monocytes Relative 9 %   Monocytes Absolute 0.8 0.1 - 1.0 K/uL   Eosinophils Relative 1 %   Eosinophils Absolute 0.1 0.0 - 0.7 K/uL   Basophils Relative 0 %   Basophils Absolute 0.0 0.0 - 0.1 K/uL  Protime-INR     Status: Abnormal   Collection Time: 08/18/15  1:47 PM  Result Value Ref Range   Prothrombin Time 19.2 (H) 11.6 - 15.2 seconds   INR 1.61 (H) 0.00 - 1.49  Comprehensive metabolic panel     Status: Abnormal   Collection Time: 08/18/15  1:47 PM  Result Value Ref Range   Sodium 139 135 - 145 mmol/L   Potassium 4.4 3.5 - 5.1 mmol/L   Chloride 103 101 - 111 mmol/L   CO2 27 22 - 32 mmol/L   Glucose, Bld 148 (H) 65 - 99 mg/dL   BUN 26 (H) 6 - 20 mg/dL   Creatinine, Ser 1.21 (H) 0.44 - 1.00 mg/dL   Calcium 8.9 8.9 - 10.3 mg/dL   Total Protein 7.2 6.5 - 8.1 g/dL   Albumin 4.0 3.5 - 5.0 g/dL   AST 25 15 - 41 U/L   ALT 14 14 - 54 U/L   Alkaline Phosphatase 51 38 - 126 U/L   Total Bilirubin 0.6 0.3 - 1.2 mg/dL   GFR calc non Af Amer 42 (L) >60 mL/min   GFR calc Af Amer 49 (L) >60 mL/min    Comment: (NOTE) The eGFR has been calculated using the CKD EPI equation. This calculation has not been validated in all clinical situations. eGFR's persistently <60 mL/min signify possible Chronic Kidney Disease.    Anion gap 9 5 - 15  Type and screen [place]     Status: None   Collection Time: 08/18/15  1:48 PM  Result Value Ref Range   ABO/RH(D) B POS    Antibody Screen NEG    Sample Expiration 08/21/2015   I-stat troponin, ED     Status: None   Collection Time: 08/18/15  1:59 PM  Result Value Ref Range   Troponin i, poc 0.00 0.00 - 0.08 ng/mL    Comment 3            Comment: Due to the release kinetics of cTnI, a negative result within the first hours of the onset of symptoms does not rule out myocardial infarction with certainty. If myocardial infarction is still suspected, repeat the test at appropriate intervals.    Dg Chest 1 View  08/18/2015  CLINICAL DATA:  Preop EXAM: CHEST 1 VIEW COMPARISON:  06/08/2015 FINDINGS: Moderate cardiomegaly. Aorta is tortuous with atherosclerotic calcification. No consolidation or lung mass. Bibasilar nodular densities are likely nipple shadows. IMPRESSION: Cardiomegaly without decompensation. Electronically Signed   By: Marybelle Killings M.D.   On: 08/18/2015 13:29   Dg Hip Unilat With Pelvis 2-3 Views Right  08/18/2015  CLINICAL DATA:  Acute right hip pain following fall today. Initial encounter. EXAM: DG HIP (WITH OR WITHOUT PELVIS) 2-3V RIGHT COMPARISON:  None. FINDINGS: A comminuted intertrochanteric fracture of the proximal right femur is noted with varus angulation. There is no evidence of subluxation or dislocation. Fractures of the superior and inferior right pubic rami are also noted. Degenerative changes in the left hip are present. IMPRESSION: Comminuted intertrochanteric right femur fracture with varus angulation. Superior and inferior right pubic rami fractures. Electronically Signed   By: Margarette Canada M.D.   On: 08/18/2015 13:28    ROS: ROS Inability to bear weight currently  Resident at [facility] currently  Physical Exam: Alert and appropriate 80 y/o female in no acute distress Cervical spine with full rom and no tenderness Bilateral upper extremities with full rom, no tenderness or deformity Right lower extremity: moderate shortening and external rotation nv intact distally No rashes or edema Left lower extremity with full rom, no tenderness Pelvis tender on exam  Physical Exam   Assessment/Plan Assessment: right femur, inferior and superior rami fractures  Plan: Medical team to  admit  Plan for surgical management of the right femur fracture but will need to delay 48-72 hours due to the eliquis Strict bedrest and will order foley Pain management - pt does not tolerate narcotics very well due to decreased mentation Will continue to monitor her progress

## 2015-08-18 NOTE — Progress Notes (Signed)
ANTICOAGULATION CONSULT NOTE - Initial Consult  Pharmacy Consult for IV Heparin Indication: Hx PE - bridge from apixaban  Allergies  Allergen Reactions  . Codeine Shortness Of Breath  . Lipitor [Atorvastatin] Diarrhea and Other (See Comments)    GI upset, rhabdomyalosis   . Aspirin Diarrhea and Other (See Comments)    Only ASA 325mg  ; pt can tolerate low dose asa 81mg  Reaction : GI upset   . Hctz [Hydrochlorothiazide] Rash  . Hydralazine Other (See Comments)    Unknown reaction - listed on [facility] 06/08/2015  . Reserpine Other (See Comments)    Unknown reaction - listed on [facility] 06/08/2015  . Tramadol Other (See Comments)    Unknown reaction - listed on [facility] 06/08/2015    Patient Measurements:   Heparin Dosing Weight:   Vital Signs: Temp: 98.4 F (36.9 C) (01/02 1304) Temp Source: Oral (01/02 1304) BP: 143/56 mmHg (01/02 1610) Pulse Rate: 71 (01/02 1610)  Labs:  Recent Labs  08/18/15 1347  HGB 11.9*  HCT 34.5*  PLT 156  LABPROT 19.2*  INR 1.61*  CREATININE 1.21*    CrCl cannot be calculated (Unknown ideal weight.).   Medical History: Past Medical History  Diagnosis Date  . Seizures ([place])   . Hypotension   . Pneumonia   . Stroke ([place])   . GI bleed   . Pulmonary embolism ([place])   . Gastrostomy tube in place [facility])   . Memory loss   . TTP (thrombotic thrombocytopenic purpura) (HCC)     Treated with PLEX in 2012 at [facility]  . Acute respiratory failure ([place]) 03/09/2015  . Dilantin toxicity 05/13/2014  . Left lower lobe pneumonia 05/13/2014    Medications:  Apixaban 2.5mg  BID, LD 1/2 @ 0700  Assessment: 15 yoF with PMHx seizures, stroke, PE on chronic anticoagulation with Apixaban (risk reduction dose), and dementia brought to ED after fall, found to have closed right hip fracture and multiple pubic rami fractures.  Pharmacy consulted to bridge patient with IV heparin in anticipation of surgery in 48-72 hours.   Baseline aPTT = 29, WNL Baseline HL = 1.66, high as  expected due to effects of apixaban CBC: Hgb slightly low at 11.9, plts WNL.   SCr 1.21, CrCl ~24 ml/min *Noted previous heparin level was supratherapeutic on heparin infusion of 700 units/hr (06/09/15)  Goal of Therapy:  Heparin level 0.3-0.7 units/ml Monitor platelets by anticoagulation protocol: Yes   Plan:  Start IV heparin 600 units/hr at 1900 (time when next dose of apixaban would be due) No bolus Titrate heparin using only aPTTs, check 1st level in 8 hours from start of infusion Measure HLs daily When HL are at or below goal range and do correlate with the aPTT, effects of the DOAC have diminished, may start using heparin level's to titrate heparin.    Ralene Bathe, PharmD, BCPS 08/18/2015, 6:03 PM  Pager: [phone]

## 2015-08-18 NOTE — H&P (Addendum)
History and Physical:    Tanya White   [id number] DOB: May 01, 1936 DOA: 08/18/2015  Referring MD/provider: Larene Pickett, PA-C PCP: [name], MD   Chief Complaint: Hip pain s/p fall  History of Present Illness:   Tanya White is an 80 y.o. female with a PMH of seizures, stroke, PE on Eliquis, dementia (resident of [facility]), who was brought to the ED after a witnessed mechanical fall. The patient triped while walking around the corner of her bed and fell. There was no head injury or reports of loss of consciousness. Staff members tried to move patient's legs, however she began complaining of bilateral hip pain so EMS was called. Staff reports patient is neurologically at her baseline.  The patient has baseline dementia and is unable to provide any details about her PMH, so the record is used to fill in the details.  The patient is accompanied by her nephew who is her POA and reports that she is a full code.   ROS:   Review of Systems  Unable to perform ROS: dementia  Does deny shortness of breath, nausea, but admits to pain in her pelvis area/right hip.   Past Medical History:   Past Medical History  Diagnosis Date  . Seizures ([place])   . Hypotension   . Pneumonia   . Stroke ([place])   . GI bleed   . Pulmonary embolism ([place])   . Gastrostomy tube in place [facility])   . Memory loss   . TTP (thrombotic thrombocytopenic purpura) (HCC)     Treated with PLEX in 2012 at [facility]  . Acute respiratory failure ([place]) 03/09/2015  . Dilantin toxicity 05/13/2014  . Left lower lobe pneumonia 05/13/2014    Past Surgical History:   Past Surgical History  Procedure Laterality Date  . Appendectomy    . Jejunostomy feeding tube      Social History:   Social History   Social History  . Marital Status: Single    Spouse Name: N/A  . Number of Children: 2  . Years of Education: N/A   Occupational History  .      Retired   Social History Main Topics  . Smoking status: Former  Smoker -- 0.50 packs/day for 15 years  . Smokeless tobacco: Never Used  . Alcohol Use: No  . Drug Use: No  . Sexual Activity: Not on file   Other Topics Concern  . Not on file   Social History Narrative   Patient lives in [place]. Never married.   Patient is retired.   Caffeine one cup daily.    Family history:   Family History  Problem Relation Age of Onset  . High blood pressure Mother   . Cancer    . Stroke      Allergies   Codeine; Lipitor; Aspirin; Hctz; Hydralazine; Reserpine; and Tramadol  Current Medications:   Prior to Admission medications   Medication Sig Start Date End Date Taking? Authorizing Provider  acetaminophen (TYLENOL) 325 MG tablet Take 2 tablets (650 mg total) by mouth every 6 (six) hours as needed for mild pain or fever. 07/22/14  Yes Bonnielee Haff, MD  albuterol (PROVENTIL) (2.5 MG/3ML) 0.083% nebulizer solution Take 2.5 mg by nebulization every 2 (two) hours as needed for wheezing or shortness of breath.   Yes Historical Provider, MD  apixaban (ELIQUIS) 2.5 MG TABS tablet Take 1 tablet (2.5 mg total) by mouth 2 (two) times daily. 06/10/15  Yes Nita Sells, MD  Cranberry  250 MG TABS Take 250 mg by mouth daily.   Yes Historical Provider, MD  divalproex (DEPAKOTE) 250 MG DR tablet Take 250 mg by mouth 2 (two) times daily.   Yes Historical Provider, MD  famotidine (PEPCID) 20 MG tablet Take 20 mg by mouth daily.    Yes Historical Provider, MD  ferrous sulfate 325 (65 FE) MG tablet Take 1 tablet (325 mg total) by mouth daily. 07/22/14  Yes Bonnielee Haff, MD  furosemide (LASIX) 40 MG tablet Take 1 tablet (40 mg total) by mouth daily as needed (for leg swelling.  Give PRN potassium when Lasix is given). 07/22/14  Yes Bonnielee Haff, MD  lacosamide (VIMPAT) 200 MG TABS tablet Take 1 tablet (200 mg total) by mouth 2 (two) times daily. 07/24/15  Yes Marcial Pacas, MD  LORazepam (ATIVAN) 2 MG/ML injection Inject 1 mg into the muscle every 6 (six)  hours as needed for seizure.   Yes Historical Provider, MD  magnesium oxide (MAG-OX) 400 MG tablet Take 1 tablet (400 mg total) by mouth daily. 03/12/15  Yes Marijean Heath, NP  metoprolol tartrate (LOPRESSOR) 25 MG tablet Take 37.5 mg by mouth 2 (two) times daily.    Yes Historical Provider, MD  ondansetron (ZOFRAN) 4 MG tablet Take 1 tablet (4 mg total) by mouth every 4 (four) hours as needed for nausea. 07/22/14  Yes Bonnielee Haff, MD  potassium chloride SA (KLOR-CON M20) 20 MEQ tablet Take 1 tablet (20 mEq total) by mouth daily as needed (whenever she is given Lasix). Patient taking differently: Take 20 mEq by mouth daily as needed (whenever lasix is administered).  07/22/14  Yes Bonnielee Haff, MD  PRESCRIPTION MEDICATION Apply 1 mg topically every 6 (six) hours as needed (agitation/anxiety). Ativan Gel 1 mg   Yes Historical Provider, MD  trimethoprim (TRIMPEX) 100 MG tablet Take 100 mg by mouth daily.    Yes Historical Provider, MD    Physical Exam:   Filed Vitals:   08/18/15 1304  BP: 175/67  Pulse: 63  Temp: 98.4 F (36.9 C)  TempSrc: Oral  Resp: 18  SpO2: 96%     Physical Exam: Blood pressure 175/67, pulse 63, temperature 98.4 F (36.9 C), temperature source Oral, resp. rate 18, SpO2 96 %. Gen: No acute distress. Head: Normocephalic, atraumatic. Eyes: PERRL, EOMI, sclerae nonicteric. Mouth: Oropharynx clear. Neck: Supple, no thyromegaly, no lymphadenopathy, no jugular venous distention. Chest: Lungs CTAB. CV: Heart sounds are regular.  No M/R/G. Abdomen: Soft, nontender, nondistended with normal active bowel sounds. Extremities: Extremities are without C/E/C. Skin: Warm and dry. Neuro: Alert and disoriented to place and date; grossly non-focal. Psych: Mood and affect normal.   Data Review:    Labs: Basic Metabolic Panel:  Recent Labs Lab 08/18/15 1347  NA 139  K 4.4  CL 103  CO2 27  GLUCOSE 148*  BUN 26*  CREATININE 1.21*  CALCIUM 8.9   Liver  Function Tests:  Recent Labs Lab 08/18/15 1347  AST 25  ALT 14  ALKPHOS 51  BILITOT 0.6  PROT 7.2  ALBUMIN 4.0   CBC:  Recent Labs Lab 08/18/15 1347  WBC 9.1  NEUTROABS 7.2  HGB 11.9*  HCT 34.5*  MCV 86.9  PLT 156    Radiographic Studies: Dg Chest 1 View  08/18/2015  CLINICAL DATA:  Preop EXAM: CHEST 1 VIEW COMPARISON:  06/08/2015 FINDINGS: Moderate cardiomegaly. Aorta is tortuous with atherosclerotic calcification. No consolidation or lung mass. Bibasilar nodular densities are likely nipple shadows. IMPRESSION:  Cardiomegaly without decompensation. Electronically Signed   By: Marybelle Killings M.D.   On: 08/18/2015 13:29   Ct Pelvis Wo Contrast  08/18/2015  CLINICAL DATA:  Right proximal femur fracture. EXAM: CT PELVIS WITHOUT CONTRAST TECHNIQUE: Multidetector CT imaging of the pelvis was performed following the standard protocol without intravenous contrast. COMPARISON:  Radiographs of same day.  CT scan of February 27, 2014. FINDINGS: Mildly displaced fractures are seen involving the right superior and inferior pubic rami. Moderately displaced and comminuted fracture is seen involving the trochanteric region of the proximal right femur. No other fracture is noted in the pelvis or sacrum. Atherosclerosis of abdominal aorta and iliac arteries is noted. There is no evidence of bowel obstruction. Sigmoid diverticulosis is noted without inflammation. Probable mild hemorrhage is seen along the right pelvic sidewall. Urinary bladder and uterus appear normal. IMPRESSION: Mildly displaced fractures are seen involving the right superior and inferior pubic rami. Moderately displaced and comminuted fracture of intertrochanteric region of proximal right femur is noted. Probable mild hematoma seen along right pelvic sidewall. Electronically Signed   By: Marijo Conception, M.D.   On: 08/18/2015 15:35   Dg Hip Unilat With Pelvis 2-3 Views Right  08/18/2015  CLINICAL DATA:  Acute right hip pain following fall  today. Initial encounter. EXAM: DG HIP (WITH OR WITHOUT PELVIS) 2-3V RIGHT COMPARISON:  None. FINDINGS: A comminuted intertrochanteric fracture of the proximal right femur is noted with varus angulation. There is no evidence of subluxation or dislocation. Fractures of the superior and inferior right pubic rami are also noted. Degenerative changes in the left hip are present. IMPRESSION: Comminuted intertrochanteric right femur fracture with varus angulation. Superior and inferior right pubic rami fractures. Electronically Signed   By: Margarette Canada M.D.   On: 08/18/2015 13:28   Dg Femur, Min 2 Views Right  08/18/2015  CLINICAL DATA:  Right hip fracture.  Severe right hip pain. EXAM: RIGHT FEMUR 2 VIEWS COMPARISON:  None. FINDINGS: Osteopenia. The mid and distal femur are intact. Unremarkable soft tissues. IMPRESSION: No evidence of acute bony injury in the mid or distal femur. Electronically Signed   By: Marybelle Killings M.D.   On: 08/18/2015 14:55   EKG: Independently reviewed. NSR at 64 BPM.  Non-specific TW abnormalities anterolateral leads.   Assessment/Plan:   Principal Problem:   Closed right hip fracture (HCC)/  Fracture of multiple pubic rami ([place]) - Orthopedic surgery to repair right hip fracture once safe to do so. - Hold Eliquis in anticipation of surgery.  Active Problems:   Pulmonary embolism ([place]) - Will treat with IV heparin for now. - Can resume Eliquis post operatively.    Gastrostomy tube in place [facility]) - Dietician consult.    Seizures (HCC) - Continue Vimpat and Depakote.    H/O: stroke with residual effects - PT/OT evaluations when stable post operatively.    AKI (acute kidney injury) ([place]) - Gently hydrate.    Aortic atherosclerosis (HCC)  - Some risk for surgery, however nephew wishes to proceed despite known risks.  DVT prophylaxis - IV heparin.  Code Status / Family Communication / Disposition Plan:   Code Status: Full. Family Communication: Deforest Hoyles Disposition Plan: [facility] SN[facility] when stable postoperatively, likely 4 days.  Attestation regarding necessity of inpatient status:   The appropriate admission status for this patient is INPATIENT. Inpatient status is judged to be reasonable and necessary in order to provide the required intensity of service to ensure the patient's safety. The patient's  presenting symptoms, physical exam findings, and initial radiographic and laboratory data in the context of their chronic comorbidities is felt to place them at high risk for further clinical deterioration. Furthermore, it is not anticipated that the patient will be medically stable for discharge from the hospital within 2 midnights of admission. The following factors support the admission status of inpatient.   -The patient's presenting symptoms include right hip pain. - The worrisome physical exam findings include tenderness right hip. - The initial radiographic and laboratory data are worrisome because of right hip fracture and right pubic rami fractures. - The chronic co-morbidities include dementia, seizure d/o, h/o stroke, h.o P.E. On chronic anticoagulation. - Patient requires inpatient status due to high intensity of service, high risk for further deterioration and high frequency of surveillance required. - I certify that at the point of admission it is my clinical judgment that the patient will require inpatient hospital care spanning beyond 2 midnights from the point of admission.   Time spent: 1 hour.  Maebelle Sulton [facility] Pager [phone] Cell: [phone]   If 7PM-7AM, please contact night-coverage [email or web address] Password [facility] 08/18/2015, 3:54 PM

## 2015-08-18 NOTE — ED Notes (Signed)
Pt can go to floor at 15:57.Marland Kitchenklj

## 2015-08-18 NOTE — ED Notes (Signed)
Per EMS- patient is a resident of [facility]. Staff reported that the patient tripped on the corner of her bed and fell. No LOC. Patient c/o bilateral hip pain when moved or palpated. Staff reports that the patient is at her baseline. Patient is currently taking eliquis. paient has a history of dementia.

## 2015-08-19 LAB — BASIC METABOLIC PANEL
Anion gap: 10 (ref 5–15)
BUN: 21 mg/dL — AB (ref 6–20)
CO2: 24 mmol/L (ref 22–32)
CREATININE: 1.24 mg/dL — AB (ref 0.44–1.00)
Calcium: 8.9 mg/dL (ref 8.9–10.3)
Chloride: 105 mmol/L (ref 101–111)
GFR, EST AFRICAN AMERICAN: 47 mL/min — AB (ref >60)
GFR, EST NON AFRICAN AMERICAN: 41 mL/min — AB (ref >60)
Glucose, Bld: 157 mg/dL — ABNORMAL HIGH (ref 65–99)
POTASSIUM: 4 mmol/L (ref 3.5–5.1)
SODIUM: 139 mmol/L (ref 135–145)

## 2015-08-19 LAB — CBC
HCT: 31.4 % — ABNORMAL LOW (ref 36.0–46.0)
Hemoglobin: 11.4 g/dL — ABNORMAL LOW (ref 12.0–15.0)
MCH: 30 pg (ref 26.0–34.0)
MCHC: 36.3 g/dL — AB (ref 30.0–36.0)
MCV: 82.6 fL (ref 78.0–100.0)
PLATELETS: 124 10*3/uL — AB (ref 150–400)
RBC: 3.8 MIL/uL — AB (ref 3.87–5.11)
RDW: 14.6 % (ref 11.5–15.5)
WBC: 10.9 10*3/uL — ABNORMAL HIGH (ref 4.0–10.5)

## 2015-08-19 LAB — SURGICAL PCR SCREEN
MRSA, PCR: POSITIVE — AB
STAPHYLOCOCCUS AUREUS: POSITIVE — AB

## 2015-08-19 LAB — APTT
APTT: 126 s — AB (ref 24–37)
aPTT: 104 seconds — ABNORMAL HIGH (ref 24–37)

## 2015-08-19 LAB — HEPARIN LEVEL (UNFRACTIONATED)
HEPARIN UNFRACTIONATED: 1.24 [IU]/mL — AB (ref 0.30–0.70)
HEPARIN UNFRACTIONATED: 1.04 [IU]/mL — AB (ref 0.30–0.70)

## 2015-08-19 MED ORDER — HEPARIN (PORCINE) IN NACL 100-0.45 UNIT/ML-% IJ SOLN
450.0000 [IU]/h | INTRAMUSCULAR | Status: DC
  Filled 2015-08-19: qty 250

## 2015-08-19 MED ORDER — HEPARIN (PORCINE) IN NACL 100-0.45 UNIT/ML-% IJ SOLN
400.0000 [IU]/h | INTRAMUSCULAR | Status: DC
Start: 2015-08-19 — End: 2015-08-20
  Filled 2015-08-19: qty 250

## 2015-08-19 NOTE — Progress Notes (Signed)
Lab called to report positive MRSA PCR.

## 2015-08-19 NOTE — Clinical Social Work Note (Signed)
Clinical Social Work Assessment  Patient Details  Name: Tanya White MRN: [id number] Date of Birth: 03/03/1936  Date of referral:  08/19/15               Reason for consult:  Discharge Planning, Facility Placement                Permission sought to share information with:  Chartered certified accountant granted to share information::  Yes, Verbal Permission Granted  Name::        Agency::     Relationship::     Contact Information:     Housing/Transportation Living arrangements for the past 2 months:  [place] of Information:  Facility, Other (Comment Required) Barrister's clerk) Patient Interpreter Needed:  None Criminal Activity/Legal Involvement Pertinent to Current Situation/Hospitalization:  No - Comment as needed Significant Relationships:  Other Family Members Lives with:    Do you feel safe going back to the place where you live?  Yes Need for family participation in patient care:  Yes (Comment)  Care giving concerns:  No concerns report at this time.   Social Worker assessment / plan:  Pt hospitalized on 08/18/15 with a right femur fx. which will require surgery. Pt is a LTC pt from [facility] ( [facility] ). She has dementia and is unable to assist with d/c planning. Pt sleeping soundly when CSW attempted to visit. CSW has contacted pt's nephew, Tanya White [phone], to assist with d/c planning. Mr. Skeet Simmer would like pt to return to [facility] following surgery. SNF contacted and is able to readmit when pt is stable for d/c. Clinicals sent to SNF for review. CSW will continue to follow to assist with d/c planning.  Employment status:  Retired Advertising copywriter, Medicaid In [place] PT Recommendations:  Not assessed at this time Information / Referral to community resources:     Patient/Family's Response to care:  Pt's nephew agrees that continued LTC is needed.  Patient/Family's Understanding of and Emotional Response to Diagnosis,  Current Treatment, and Prognosis:  Pt's nephew is aware of pt's medical status. Pt's nephew would like pt to return to [facility] at d/c.  Emotional Assessment Appearance:  Appears stated age Attitude/Demeanor/Rapport:  Unable to Assess Affect (typically observed):  Unable to Assess Orientation:  Oriented to Self Alcohol / Substance use:    Psych involvement (Current and /or in the community):  No (Comment)  Discharge Needs  Concerns to be addressed:  Discharge Planning Concerns Readmission within the last 30 days:  No Current discharge risk:  None Barriers to Discharge:  No Barriers Identified   Tanya White, [name] 08/19/2015, 11:19 AM

## 2015-08-19 NOTE — Progress Notes (Signed)
ANTICOAGULATION CONSULT NOTE - F/u Consult  Pharmacy Consult for IV Heparin Indication: Hx PE - bridge from apixaban  Allergies  Allergen Reactions  . Codeine Shortness Of Breath  . Lipitor [Atorvastatin] Diarrhea and Other (See Comments)    GI upset, rhabdomyalosis   . Aspirin Diarrhea and Other (See Comments)    Only ASA 325mg  ; pt can tolerate low dose asa 81mg  Reaction : GI upset   . Hctz [Hydrochlorothiazide] Rash  . Hydralazine Other (See Comments)    Unknown reaction - listed on [facility] 06/08/2015  . Reserpine Other (See Comments)    Unknown reaction - listed on [facility] 06/08/2015  . Tramadol Other (See Comments)    Unknown reaction - listed on [facility] 06/08/2015    Patient Measurements: Height: 4\' 9"  (144.8 cm) Weight: 95 lb 14.4 oz (43.5 kg) IBW/kg (Calculated) : 38.6 Heparin Dosing Weight:   Vital Signs: Temp: 98.6 F (37 C) (01/03 0440) Temp Source: Oral (01/03 0440) BP: 151/69 mmHg (01/03 0440) Pulse Rate: 97 (01/03 0440)  Labs:  Recent Labs  08/18/15 1347 08/18/15 1750 08/19/15 0305  HGB 11.9*  --   --   HCT 34.5*  --   --   PLT 156  --   --   APTT  --  29 126*  LABPROT 19.2*  --   --   INR 1.61*  --   --   HEPARINUNFRC  --  1.66*  --   CREATININE 1.21*  --   --     Estimated Creatinine Clearance: 23.7 mL/min (by C-G formula based on Cr of 1.21).   Medical History: Past Medical History  Diagnosis Date  . Seizures ([place])   . Hypotension   . Pneumonia   . Stroke ([place])   . GI bleed   . Pulmonary embolism ([place])   . Gastrostomy tube in place [facility])   . Memory loss   . TTP (thrombotic thrombocytopenic purpura) (HCC)     T[facility] with [facility]LEX in 2012 at [facility]  . Acute respiratory failure ([place]) 03/09/2015  . Dilantin toxicity 05/13/2014  . Left lower lobe pneumonia 05/13/2014    Medications:  Apixaban 2.5mg  BID, LD 1/2 @ 0700  Assessment: 35 yoF with PMHx seizures, stroke, PE on chronic anticoagulation with Apixaban (risk reduction dose), and dementia  brought to ED after fall, found to have closed right hip fracture and multiple pubic rami fractures.  Pharmacy consulted to bridge patient with IV heparin in anticipation of surgery in 48-72 hours.   Baseline aPTT = 29, WNL Baseline HL = 1.66, high as expected due to effects of apixaban CBC: Hgb slightly low at 11.9, plts WNL.   SCr 1.21, CrCl ~24 ml/min *Noted previous heparin level was supratherapeutic on heparin infusion of 700 units/hr (06/09/15) Today, 1/3 0305 aptt=126 sec and HL=1.24, CrCl~23 ml/min, no problems per RN  Goal of Therapy:  aPtt 66-102 Heparin level 0.3-0.7 units/ml Monitor platelets by anticoagulation protocol: Yes   Plan:  Decrease heparin drip to 450 units/hr (4.5 ml/hr) Recheck aPtt in 8 hours Titrate heparin using only aPTTs, check 1st level in 8 hours from start of infusion Measure HLs daily When HL are at or below goal range and do correlate with the aPTT, effects of the DOAC have diminished, may start using heparin level's to titrate heparin.    Tanya White R 08/19/2015, 6:09 AM

## 2015-08-19 NOTE — Progress Notes (Signed)
[facility] PROGRESS NOTE  Tanya White [id number] DOB: 08/06/1936 DOA: 08/18/2015  PCP: [name], MD  Brief HPI: 80 year old African-American female with past medical history of seizures, stroke, pulmonary embolism on anticoagulation, dementia, presented after a mechanical fall. This resulted in pubic rami fracture as well as fracture of the right hip. She was hospitalized for further management.  Past medical history:  Past Medical History  Diagnosis Date  . Seizures ([place])   . Hypotension   . Pneumonia   . Stroke ([place])   . GI bleed   . Pulmonary embolism ([place])   . Gastrostomy tube in place [facility])   . Memory loss   . TTP (thrombotic thrombocytopenic purpura) (HCC)     Treated with PLEX in 2012 at [facility]  . Acute respiratory failure ([place]) 03/09/2015  . Dilantin toxicity 05/13/2014  . Left lower lobe pneumonia 05/13/2014    Consultants:  orthopedics  Procedures:  none yet  Antibiotics: none  Subjective: Patient is pleasantly confused. Unable to obtain much information from her.  Objective: Vital Signs  Filed Vitals:   08/18/15 1800 08/18/15 2100 08/19/15 0440 08/19/15 1001  BP:  156/74 151/69 151/69  Pulse:  94 97   Temp:  98.4 F (36.9 C) 98.6 F (37 C)   TempSrc:  Oral Oral   Resp:  16 16   Height: 4\' 9"  (1.448 m)     Weight: 43.5 kg (95 lb 14.4 oz)     SpO2:  98% 96%     Intake/Output Summary (Last 24 hours) at 08/19/15 1115 Last data filed at 08/19/15 0441  Gross per 24 hour  Intake      0 ml  Output    800 ml  Net   -800 ml   Filed Weights   08/18/15 1800  Weight: 43.5 kg (95 lb 14.4 oz)    General appearance: alert, cooperative, distracted and no distress Resp: clear to auscultation bilaterally Cardio: regular rate and rhythm, S1, S2 normal, no murmur, click, rub or gallop GI: soft, non-tender; bowel sounds normal; no masses,  no organomegaly Extremities: Right lower extremity is externally rotated Neurologic: Awake, confused. No  obvious focal deficits.  Lab Results:  Basic Metabolic Panel:  Recent Labs Lab 08/18/15 1347  NA 139  K 4.4  CL 103  CO2 27  GLUCOSE 148*  BUN 26*  CREATININE 1.21*  CALCIUM 8.9   Liver Function Tests:  Recent Labs Lab 08/18/15 1347  AST 25  ALT 14  ALKPHOS 51  BILITOT 0.6  PROT 7.2  ALBUMIN 4.0   CBC:  Recent Labs Lab 08/18/15 1347  WBC 9.1  NEUTROABS 7.2  HGB 11.9*  HCT 34.5*  MCV 86.9  PLT 156   CBG: No results for input(s): GLUCAP in the last 168 hours.  Recent Results (from the past 240 hour(s))  Urine culture     Status: None (Preliminary result)   Collection Time: 08/18/15  5:33 PM  Result Value Ref Range Status   Specimen Description URINE, RANDOM  Final   Special Requests NONE  Final   Culture   Final    TOO YOUNG TO READ Performed at [facility]    Report Status PENDING  Incomplete  Surgical pcr screen     Status: Abnormal   Collection Time: 08/19/15  6:41 AM  Result Value Ref Range Status   MRSA, PCR POSITIVE (A) NEGATIVE Final    Comment: RESULT CALLED TO, READ BACK BY AND VERIFIED WITH: TARTELOW RN AT  0824 ON 1.3.17 BY SHUEA    Staphylococcus aureus POSITIVE (A) NEGATIVE Final    Comment:        The Xpert SA Assay (FDA approved for NASAL specimens in patients over 44 years of age), is one component of a comprehensive surveillance program.  Test performance has been validated by [facility] for patients greater than or equal to 60 year old. It is not intended to diagnose infection nor to guide or monitor treatment.       Studies/Results: Dg Chest 1 View  08/18/2015  CLINICAL DATA:  Preop EXAM: CHEST 1 VIEW COMPARISON:  06/08/2015 FINDINGS: Moderate cardiomegaly. Aorta is tortuous with atherosclerotic calcification. No consolidation or lung mass. Bibasilar nodular densities are likely nipple shadows. IMPRESSION: Cardiomegaly without decompensation. Electronically Signed   By: Marybelle Killings M.D.   On: 08/18/2015 13:29     Ct Pelvis Wo Contrast  08/18/2015  CLINICAL DATA:  Right proximal femur fracture. EXAM: CT PELVIS WITHOUT CONTRAST TECHNIQUE: Multidetector CT imaging of the pelvis was performed following the standard protocol without intravenous contrast. COMPARISON:  Radiographs of same day.  CT scan of February 27, 2014. FINDINGS: Mildly displaced fractures are seen involving the right superior and inferior pubic rami. Moderately displaced and comminuted fracture is seen involving the trochanteric region of the proximal right femur. No other fracture is noted in the pelvis or sacrum. Atherosclerosis of abdominal aorta and iliac arteries is noted. There is no evidence of bowel obstruction. Sigmoid diverticulosis is noted without inflammation. Probable mild hemorrhage is seen along the right pelvic sidewall. Urinary bladder and uterus appear normal. IMPRESSION: Mildly displaced fractures are seen involving the right superior and inferior pubic rami. Moderately displaced and comminuted fracture of intertrochanteric region of proximal right femur is noted. Probable mild hematoma seen along right pelvic sidewall. Electronically Signed   By: Marijo Conception, M.D.   On: 08/18/2015 15:35   Dg Hip Unilat With Pelvis 2-3 Views Right  08/18/2015  CLINICAL DATA:  Acute right hip pain following fall today. Initial encounter. EXAM: DG HIP (WITH OR WITHOUT PELVIS) 2-3V RIGHT COMPARISON:  None. FINDINGS: A comminuted intertrochanteric fracture of the proximal right femur is noted with varus angulation. There is no evidence of subluxation or dislocation. Fractures of the superior and inferior right pubic rami are also noted. Degenerative changes in the left hip are present. IMPRESSION: Comminuted intertrochanteric right femur fracture with varus angulation. Superior and inferior right pubic rami fractures. Electronically Signed   By: Margarette Canada M.D.   On: 08/18/2015 13:28   Dg Femur, Min 2 Views Right  08/18/2015  CLINICAL DATA:  Right hip  fracture.  Severe right hip pain. EXAM: RIGHT FEMUR 2 VIEWS COMPARISON:  None. FINDINGS: Osteopenia. The mid and distal femur are intact. Unremarkable soft tissues. IMPRESSION: No evidence of acute bony injury in the mid or distal femur. Electronically Signed   By: Marybelle Killings M.D.   On: 08/18/2015 14:55    Medications:  Scheduled: . divalproex  250 mg Oral BID  . famotidine  20 mg Oral Daily  . ferrous sulfate  325 mg Oral Daily  . lacosamide  200 mg Oral BID  . magnesium oxide  400 mg Oral Daily  . metoprolol tartrate  37.5 mg Oral BID  . polyethylene glycol  17 g Oral Daily  . trimethoprim  100 mg Oral Daily   Continuous: . sodium chloride    . heparin 450 Units/hr (08/19/15 0636)   [id number] **OR** acetaminophen, albuterol,  alum & mag hydroxide-simeth, bisacodyl, LORazepam, methocarbamol **OR** methocarbamol (ROBAXIN)  IV, morphine injection, ondansetron **OR** ondansetron (ZOFRAN) IV  Assessment/Plan:  Principal Problem:   Closed right hip fracture (HCC) Active Problems:   Pulmonary embolism (HCC)   Gastrostomy tube in place ([place])   Seizures ([place])   H/O: stroke with residual effects   AKI (acute kidney injury) ([place])   Fracture of multiple pubic rami ([place])   Aortic atherosclerosis ([place])   Fall    Closed right hip fracture/ Fracture of multiple pubic rami Orthopedic surgery to repair right hip fracture once safe to do so. Holding Eliquis in anticipation of surgery. Initial medical preop assessment done at the time of admission. Surgery is planned for Thursday.  Pulmonary embolism Patient was on Eliquis. She was started on intravenous heparin at the time of admission. This will be continued for now. Eliquis is on hold for the surgery.  History of Gastrostomy tube She used to have a PEG tube after her stroke. However, no longer present.  History of Seizures Continue Vimpat and Depakote.  H/O: stroke with residual effects PT/OT evaluations when stable post  operatively.  AKI (acute kidney injury) Gently hydrate. Recheck labs   DVT Prophylaxis: Currently on IV heparin    Code Status: Full code  Family Communication: No family at bedside  Disposition Plan: Continue current treatment. Await surgery on Thursday.    LOS: 1 day   [facility] Pager [phone] 08/19/2015, 11:15 AM  If 7PM-7AM, please contact night-coverage at [email or web address], password [facility]

## 2015-08-19 NOTE — Progress Notes (Addendum)
ANTICOAGULATION CONSULT NOTE - F/u Consult  Pharmacy Consult for IV Heparin Indication: Hx PE - bridge from apixaban  Allergies  Allergen Reactions  . Codeine Shortness Of Breath  . Lipitor [Atorvastatin] Diarrhea and Other (See Comments)    GI upset, rhabdomyalosis   . Aspirin Diarrhea and Other (See Comments)    Only ASA 325mg  ; pt can tolerate low dose asa 81mg  Reaction : GI upset   . Hctz [Hydrochlorothiazide] Rash  . Hydralazine Other (See Comments)    Unknown reaction - listed on [facility] 06/08/2015  . Reserpine Other (See Comments)    Unknown reaction - listed on [facility] 06/08/2015  . Tramadol Other (See Comments)    Unknown reaction - listed on [facility] 06/08/2015    Patient Measurements: Height: 4\' 9"  (144.8 cm) Weight: 95 lb 14.4 oz (43.5 kg) IBW/kg (Calculated) : 38.6 Heparin Dosing Weight:   Vital Signs: Temp: 98.6 F (37 C) (01/03 0440) Temp Source: Oral (01/03 0440) BP: 151/69 mmHg (01/03 1001) Pulse Rate: 97 (01/03 0440)  Labs:  Recent Labs  08/18/15 1347 08/18/15 1750 08/19/15 0305 08/19/15 0307 08/19/15 1018  HGB 11.9*  --   --   --   --   HCT 34.5*  --   --   --   --   PLT 156  --   --   --   --   APTT  --  29 126*  --   --   LABPROT 19.2*  --   --   --   --   INR 1.61*  --   --   --   --   HEPARINUNFRC  --  1.66*  --  1.24*  --   CREATININE 1.21*  --   --   --  1.24*    Estimated Creatinine Clearance: 23.2 mL/min (by C-G formula based on Cr of 1.24).   Medications:  Apixaban 2.5mg  BID, LD 1/2 @ 0700  Assessment: 58 yoF with PMHx seizures, stroke, PE on chronic anticoagulation with Apixaban (risk reduction dose), and dementia brought to ED after fall, found to have closed right hip fracture and multiple pubic rami fractures.  Pharmacy consulted to bridge patient with IV heparin in anticipation of surgery in 48-72 hours.   Baseline aPTT = 29, WNL Baseline HL = 1.66, high as expected due to effects of apixaban (LD 1/2 7am)  Today's  Labs, 08/19/2015    Initial aPTT elevated this am on 600 units/hr, heparin rate reduced  No CBC this am - draw with heparin level  Renal function decreased  *Noted previous heparin level was supratherapeutic on heparin infusion of 700 units/hr (06/09/15)  Goal of Therapy:  aPTT 66-102 Heparin level 0.3-0.7 units/ml Monitor platelets by anticoagulation protocol: Yes   Plan:   Continue heparin drip to 450 units/hr (4.5 ml/hr)  Heparin level, CBC, aPTT at 14:30  Titrate heparin using only aPTTs, check 1st level in 8 hours from start of infusion  Daily heparin level and CBC  When HL are at or below goal range and do correlate with the aPTT, effects of the DOAC have diminished, may start using heparin level's to titrate heparin.    Plan is for OR 08/21/15  Doreene Eland, PharmD, BCPS.   Pager: [phone] 08/19/2015 2:49 PM  Addendum: APTT = 104 sec on heparin gtt at 450 units/hr  Plan:  APTT slightly supratherapeutic, reduce rate to 400 units/hr  Check labs in am  Doreene Eland, PharmD, BCPS.   Pager: [phone] 08/19/2015  3:34 PM

## 2015-08-19 NOTE — Plan of Care (Signed)
Problem: Education: Goal: Knowledge of [facility] Education information/materials will improve Outcome: Not Met (add Reason) Pt has dementia. Unable to retain new information re: her care.  Problem: Health Behavior/Discharge Planning: Goal: Ability to manage health-related needs will improve Outcome: Not Met (add Reason) Poor self-care d/t dementia  Problem: Education: Goal: Verbalization of understanding the information provided (i.e., activity precautions, restrictions, etc) will improve Outcome: Not Met (add Reason) The patient has dementia.

## 2015-08-20 DIAGNOSIS — S32810A Multiple fractures of pelvis with stable disruption of pelvic ring, initial encounter for closed fracture: Secondary | ICD-10-CM | POA: Insufficient documentation

## 2015-08-20 DIAGNOSIS — N390 Urinary tract infection, site not specified: Secondary | ICD-10-CM

## 2015-08-20 DIAGNOSIS — B964 Proteus (mirabilis) (morganii) as the cause of diseases classified elsewhere: Secondary | ICD-10-CM | POA: Diagnosis present

## 2015-08-20 DIAGNOSIS — I2699 Other pulmonary embolism without acute cor pulmonale: Secondary | ICD-10-CM

## 2015-08-20 LAB — BASIC METABOLIC PANEL
ANION GAP: 6 (ref 5–15)
BUN: 21 mg/dL — ABNORMAL HIGH (ref 6–20)
CALCIUM: 8.4 mg/dL — AB (ref 8.9–10.3)
CHLORIDE: 107 mmol/L (ref 101–111)
CO2: 26 mmol/L (ref 22–32)
CREATININE: 1.09 mg/dL — AB (ref 0.44–1.00)
GFR calc non Af Amer: 48 mL/min — ABNORMAL LOW (ref >60)
GFR, EST AFRICAN AMERICAN: 55 mL/min — AB (ref >60)
GLUCOSE: 119 mg/dL — AB (ref 65–99)
Potassium: 4.6 mmol/L (ref 3.5–5.1)
Sodium: 139 mmol/L (ref 135–145)

## 2015-08-20 LAB — HEPARIN LEVEL (UNFRACTIONATED)
Heparin Unfractionated: 0.57 IU/mL (ref 0.30–0.70)
Heparin Unfractionated: 0.51 [IU]/mL (ref 0.30–0.70)

## 2015-08-20 LAB — URINE CULTURE: Culture: >=100000

## 2015-08-20 LAB — CBC
HEMATOCRIT: 26.4 % — AB (ref 36.0–46.0)
HEMOGLOBIN: 9.2 g/dL — AB (ref 12.0–15.0)
MCH: 29.7 pg (ref 26.0–34.0)
MCHC: 34.8 g/dL (ref 30.0–36.0)
MCV: 85.2 fL (ref 78.0–100.0)
Platelets: 103 10*3/uL — ABNORMAL LOW (ref 150–400)
RBC: 3.1 MIL/uL — AB (ref 3.87–5.11)
RDW: 14.6 % (ref 11.5–15.5)
WBC: 9.9 10*3/uL (ref 4.0–10.5)

## 2015-08-20 LAB — RETICULOCYTES
RBC.: 3.25 MIL/uL — AB (ref 3.87–5.11)
RETIC COUNT ABSOLUTE: 45.5 10*3/uL (ref 19.0–186.0)
Retic Ct Pct: 1.4 % (ref 0.4–3.1)

## 2015-08-20 LAB — APTT
APTT: 53 s — AB (ref 24–37)
aPTT: 42 seconds — ABNORMAL HIGH (ref 24–37)

## 2015-08-20 MED ORDER — BOOST / RESOURCE BREEZE PO LIQD
1.0000 | Freq: Three times a day (TID) | ORAL | Status: DC
  Administered 2015-08-20 – 2015-08-24 (×7): 1 via ORAL

## 2015-08-20 MED ORDER — HEPARIN (PORCINE) IN NACL 100-0.45 UNIT/ML-% IJ SOLN
500.0000 [IU]/h | INTRAMUSCULAR | Status: DC
  Administered 2015-08-21: 500 [IU]/h via INTRAVENOUS
  Filled 2015-08-20 (×2): qty 250

## 2015-08-20 MED ORDER — DEXTROSE 5 % IV SOLN
1.0000 g | INTRAVENOUS | Status: DC
  Administered 2015-08-20: 1 g via INTRAVENOUS
  Filled 2015-08-20 (×2): qty 10

## 2015-08-20 MED ORDER — HEPARIN (PORCINE) IN NACL 100-0.45 UNIT/ML-% IJ SOLN
500.0000 [IU]/h | INTRAMUSCULAR | Status: DC
  Administered 2015-08-20: 500 [IU]/h via INTRAVENOUS
  Filled 2015-08-20: qty 250

## 2015-08-20 MED ORDER — VANCOMYCIN HCL IN DEXTROSE 1-5 GM/200ML-% IV SOLN
1000.0000 mg | INTRAVENOUS | Status: AC
  Administered 2015-08-21: 1000 mg via INTRAVENOUS
  Filled 2015-08-20: qty 200

## 2015-08-20 NOTE — Progress Notes (Signed)
ANTICOAGULATION CONSULT NOTE - F/u Consult  Pharmacy Consult for IV Heparin Indication: Hx PE - bridge from apixaban  Allergies  Allergen Reactions  . Codeine Shortness Of Breath  . Lipitor [Atorvastatin] Diarrhea and Other (See Comments)    GI upset, rhabdomyalosis   . Aspirin Diarrhea and Other (See Comments)    Only ASA 325mg  ; pt can tolerate low dose asa 81mg  Reaction : GI upset   . Hctz [Hydrochlorothiazide] Rash  . Hydralazine Other (See Comments)    Unknown reaction - listed on [facility] 06/08/2015  . Reserpine Other (See Comments)    Unknown reaction - listed on [facility] 06/08/2015  . Tramadol Other (See Comments)    Unknown reaction - listed on [facility] 06/08/2015    Patient Measurements: Height: 4\' 9"  (144.8 cm) Weight: 95 lb 14.4 oz (43.5 kg) IBW/kg (Calculated) : 38.6 Heparin Dosing Weight:   Vital Signs: Temp: 98.2 F (36.8 C) (01/04 0135) Temp Source: Oral (01/04 0135) BP: 148/90 mmHg (01/04 0135) Pulse Rate: 88 (01/04 0135)  Labs:  Recent Labs  08/18/15 1347  08/19/15 0305 08/19/15 0307 08/19/15 1018 08/19/15 1442 08/19/15 1555 08/20/15 0505  HGB 11.9*  --   --   --   --   --  11.4* 9.2*  HCT 34.5*  --   --   --   --   --  31.4* 26.4*  PLT 156  --   --   --   --   --  124* 103*  APTT  --   < > 126*  --   --  104*  --  42*  LABPROT 19.2*  --   --   --   --   --   --   --   INR 1.61*  --   --   --   --   --   --   --   HEPARINUNFRC  --   < >  --  1.24*  --  1.04*  --  0.57  CREATININE 1.21*  --   --   --  1.24*  --   --  1.09*  < > = values in this interval not displayed.  Estimated Creatinine Clearance: 26.3 mL/min (by C-G formula based on Cr of 1.09).   Medications:  Apixaban 2.5mg  BID, LD 1/2 @ 0700  Assessment: 38 yoF with PMHx seizures, stroke, PE on chronic anticoagulation with Apixaban (risk reduction dose), and dementia brought to ED after fall, found to have closed right hip fracture and multiple pubic rami fractures.  Pharmacy consulted to  bridge patient with IV heparin in anticipation of surgery in 48-72 hours.   Baseline aPTT = 29, WNL Baseline HL = 1.66, high as expected due to effects of apixaban (LD 1/2 7am)  1/3  Initial aPTT elevated this am on 600 units/hr, heparin rate reduced  No CBC this am - draw with heparin level  Renal function decreased Today, 1/4  0500 HL=0.57 and aPtt=42 sec (not correlating HL still probably affected by DOAC)  *Noted previous heparin level was supratherapeutic on heparin infusion of 700 units/hr (06/09/15)  Goal of Therapy:  aPTT 66-102 Heparin level 0.3-0.7 units/ml Monitor platelets by anticoagulation protocol: Yes   Plan:   Increase heparin drip to 500 units/hr (5 ml/hr)  Heparin level, CBC, aPTT at 14:30  Titrate heparin using only aPTTs  Daily heparin level and CBC  When HL are at or below goal range and do correlate with the aPTT, effects of the  DOAC have diminished, may start using heparin level's to titrate heparin.    Plan is for OR 08/21/15   Thanks, Dorrene German 08/20/2015 6:10 AM

## 2015-08-20 NOTE — Progress Notes (Signed)
Initial Nutrition Assessment  INTERVENTION:   Provide Boost Breeze po TID, each supplement provides 250 kcal and 9 grams of protein RD to continue to monitor   NUTRITION DIAGNOSIS:   Predicted suboptimal nutrient intake related to lethargy/confusion (dementia) as evidenced by other (see comment) (per medical history).  GOAL:   Patient will meet greater than or equal to 90% of their needs  MONITOR:   PO intake, Supplement acceptance, Labs, Weight trends, Skin, I & O's  REASON FOR ASSESSMENT:   Consult Assessment of nutrition requirement/status  ASSESSMENT:   80 y.o. female with a PMH of seizures, stroke, PE on Eliquis, dementia (resident of [facility]), who was brought to the ED after a witnessed mechanical fall. The patient triped while walking around the corner of her bed and fell.  Surgery planned for tomorrow 1/5.  Pt in room with no family present. Pt confused and unable to provide any history. Pt eating 100% of regular diet. Weight is stable over the last year. Pt with history of PEG but no longer uses this for nutritional purposes. Per nutrition assessment completed during a previous admission, Boost Breeze supplements were ordered. RD to order supplements. Will follow-up after surgery to assess needs then.  Labs reviewed: Elevated BUN & Creatinine  Diet Order:  Diet regular Room service appropriate?: Yes; Fluid consistency:: Thin Diet NPO time specified Except for: Sips with Meds  Skin:  Reviewed, no issues  Last BM:  1/4  Height:   Ht Readings from Last 1 Encounters:  08/18/15 4\' 9"  (1.448 m)    Weight:   Wt Readings from Last 1 Encounters:  08/18/15 95 lb 14.4 oz (43.5 kg)    Ideal Body Weight:  43.3 kg  BMI:  Body mass index is 20.75 kg/(m^2).  Estimated Nutritional Needs:   Kcal:  1100-1300  Protein:  65-75g  Fluid:  1.5L/day  EDUCATION NEEDS:   No education needs identified at this time  Clayton Bibles, MS, RD, LDN Pager: [phone] After  Hours Pager: [phone]

## 2015-08-20 NOTE — Progress Notes (Signed)
PHARMACIST - PHYSICIAN COMMUNICATION CONCERNING:  IV heparin  68 yoF with PMHx seizures, stroke, PE on chronic anticoagulation with Apixaban (risk reduction dose), and dementia brought to ED after fall, found to have closed right hip fracture and multiple pubic rami fractures. Pt has been on IV heparin bridge for ortho procedure on 1/5.   RECOMMENDATION: Continue IV heparin at current rate of 500 units/hr.  Turn off heparin at 0800 for OR procedure tomorrow (RN aware).   Ralene Bathe, PharmD, BCPS 08/20/2015, 10:05 PM  Pager: [phone]

## 2015-08-20 NOTE — Progress Notes (Signed)
ANTICOAGULATION CONSULT NOTE - F/u Consult  Pharmacy Consult for IV Heparin Indication: Hx PE - bridge from apixaban  Allergies  Allergen Reactions  . Codeine Shortness Of Breath  . Lipitor [Atorvastatin] Diarrhea and Other (See Comments)    GI upset, rhabdomyalosis   . Aspirin Diarrhea and Other (See Comments)    Only ASA 325mg  ; pt can tolerate low dose asa 81mg  Reaction : GI upset   . Hctz [Hydrochlorothiazide] Rash  . Hydralazine Other (See Comments)    Unknown reaction - listed on [facility] 06/08/2015  . Reserpine Other (See Comments)    Unknown reaction - listed on [facility] 06/08/2015  . Tramadol Other (See Comments)    Unknown reaction - listed on [facility] 06/08/2015    Patient Measurements: Height: 4\' 9"  (144.8 cm) Weight: 95 lb 14.4 oz (43.5 kg) IBW/kg (Calculated) : 38.6 Heparin Dosing Weight:   Vital Signs: Temp: 98.3 F (36.8 C) (01/04 0540) Temp Source: Oral (01/04 0540) BP: 142/60 mmHg (01/04 0540) Pulse Rate: 82 (01/04 0540)  Labs:  Recent Labs  08/18/15 1347  08/19/15 0305 08/19/15 0307 08/19/15 1018 08/19/15 1442 08/19/15 1555 08/20/15 0505  HGB 11.9*  --   --   --   --   --  11.4* 9.2*  HCT 34.5*  --   --   --   --   --  31.4* 26.4*  PLT 156  --   --   --   --   --  124* 103*  APTT  --   < > 126*  --   --  104*  --  42*  LABPROT 19.2*  --   --   --   --   --   --   --   INR 1.61*  --   --   --   --   --   --   --   HEPARINUNFRC  --   < >  --  1.24*  --  1.04*  --  0.57  CREATININE 1.21*  --   --   --  1.24*  --   --  1.09*  < > = values in this interval not displayed.  Estimated Creatinine Clearance: 26.3 mL/min (by C-G formula based on Cr of 1.09).   Medications:  Apixaban 2.5mg  BID, LD 1/2 @ 0700  Assessment: 11 yoF with PMHx seizures, stroke, PE on chronic anticoagulation with Apixaban (risk reduction dose), and dementia brought to ED after fall, found to have closed right hip fracture and multiple pubic rami fractures.  Pharmacy consulted to  bridge patient with IV heparin in anticipation of surgery in 48-72 hours.   Baseline aPTT = 29, WNL Baseline HL = 1.66, high as expected due to effects of apixaban (LD 1/2 7am)  Today's  Labs, 08/20/2015   APTT low this am, heparin level therapeutic (coag labs not correlating so apixaban still effecting anti-xa level) - rate was increased from 450 units/hr to 500 units/hr  CBC: Hgb and pltc trending down  No bleeding noted  Renal function decreased  *Noted previous heparin level was supratherapeutic on heparin infusion of 700 units/hr (06/09/15)  Goal of Therapy:  aPTT 66-102 Heparin level 0.3-0.7 units/ml Monitor platelets by anticoagulation protocol: Yes   Plan:   Continue heparin drip to 500 units/hr (5 ml/hr)  Heparin level, aPTT at 14:30  Daily heparin level and CBC  Watch CBC closely  When HL are at or below goal range and do correlate with the aPTT, effects  of the DOAC have diminished, may start using heparin level's to titrate heparin.    Plan is for OR 08/21/15 - to turn off heparin at 8am 08/21/15  Doreene Eland, PharmD, BCPS.   Pager: [phone] 08/20/2015 11:34 AM

## 2015-08-20 NOTE — Progress Notes (Signed)
Plan for surgery tomorrow (waiting due to eliquis) Obtain consent from family due to dementia NPO after MN tonight D/c heparin gtt tomorrow at 0800

## 2015-08-20 NOTE — Progress Notes (Signed)
[facility] PROGRESS NOTE  Tanya White [id number] DOB: April 18, 1936 DOA: 08/18/2015 PCP: [name], MD  Brief interval history  80 year old African-American female with past medical history of seizures, stroke, pulmonary embolism on anticoagulation, dementia, presented after a mechanical fall. This resulted in pubic rami fracture as well as fracture of the right hip. She was hospitalized for further management.   Assessment/Plan: #1 right comminuted intertrochanteric femur fracture Secondary to mechanical fall. Eliquis on hold. Patient has been assessed by orthopedics and plan is for surgery tomorrow 08/21/2015. Pain management. Per orthopedics.  #2 Proteus mirabilis UTI Start IV Rocephin.  #3 pulmonary embolus Patient was on eliquis, which has been held in anticipation of surgery. Currently on IV heparin. Orthopedics to advise when anticoagulation may be resumed.  #4 history of gastrostomy tube Patient used to have a PEG tube after her stroke however no longer present.  #5 history of seizures Continue Vimpat and Depakote. Will discontinue when necessary Ativan for now as patient was given a dose of Ativan early this morning and has had some confusion per nursing.  #6 history of stroke with residual deficits PT/OT postop.  #7 acute renal failure Likely secondary to prerenal azotemia. Renal function improving. Follow.  #8 anemia Check an anemia panel. Follow H&H.  #9 prophylaxis Heparin for DVT prophylaxis.  Code Status: Full Family Communication: No family at bedside. Disposition Plan: Likely skilled nursing facility once hip has been repaired.   Consultants:  Orthopedics: Dr. Lyla Glassing 08/18/2015  Procedures:  CT pelvis 08/18/2015  Chest x-ray 08/18/2015  Plain films of the right hip and pelvis 08/18/2015  Plain films of the right femur 08/18/2015  Antibiotics:  IV Rocephin 08/20/2015  HPI/Subjective: Patient nonverbal.  Objective: Filed  Vitals:   08/20/15 0135 08/20/15 0540  BP: 148/90 142/60  Pulse: 88 82  Temp: 98.2 F (36.8 C) 98.3 F (36.8 C)  Resp: 16 16    Intake/Output Summary (Last 24 hours) at 08/20/15 1436 Last data filed at 08/20/15 1000  Gross per 24 hour  Intake 1154.17 ml  Output   1100 ml  Net  54.17 ml   Filed Weights   08/18/15 1800  Weight: 43.5 kg (95 lb 14.4 oz)    Exam:   General:  NAD  Cardiovascular: RRR  Respiratory: CTAB  Abdomen: Soft/NT/ND/+BS  Musculoskeletal: No c/c/e  Data Reviewed: Basic Metabolic Panel:  Recent Labs Lab 08/18/15 1347 08/19/15 1018 08/20/15 0505  NA 139 139 139  K 4.4 4.0 4.6  CL 103 105 107  CO2 27 24 26   GLUCOSE 148* 157* 119*  BUN 26* 21* 21*  CREATININE 1.21* 1.24* 1.09*  CALCIUM 8.9 8.9 8.4*   Liver Function Tests:  Recent Labs Lab 08/18/15 1347  AST 25  ALT 14  ALKPHOS 51  BILITOT 0.6  PROT 7.2  ALBUMIN 4.0   No results for input(s): LIPASE, AMYLASE in the last 168 hours. No results for input(s): AMMONIA in the last 168 hours. CBC:  Recent Labs Lab 08/18/15 1347 08/19/15 1555 08/20/15 0505  WBC 9.1 10.9* 9.9  NEUTROABS 7.2  --   --   HGB 11.9* 11.4* 9.2*  HCT 34.5* 31.4* 26.4*  MCV 86.9 82.6 85.2  PLT 156 124* 103*   Cardiac Enzymes: No results for input(s): CKTOTAL, CKMB, CKMBINDEX, TROPONINI in the last 168 hours. BNP (last 3 results) No results for input(s): BNP in the last 8760 hours.  ProBNP (last 3 results) No results for input(s): PROBNP in the last 8760 hours.  CBG: No  results for input(s): GLUCAP in the last 168 hours.  Recent Results (from the past 240 hour(s))  Urine culture     Status: None   Collection Time: 08/18/15  5:33 PM  Result Value Ref Range Status   Specimen Description URINE, RANDOM  Final   Special Requests NONE  Final   Culture   Final    >=100,000 COLONIES/mL PROTEUS MIRABILIS Performed at [facility]    Report Status 08/20/2015 FINAL  Final   Organism ID,  Bacteria PROTEUS MIRABILIS  Final      Susceptibility   Proteus mirabilis - MIC*    AMPICILLIN <=2 SENSITIVE Sensitive     CEFAZOLIN <=4 SENSITIVE Sensitive     CEFTRIAXONE <=1 SENSITIVE Sensitive     CIPROFLOXACIN >=4 RESISTANT Resistant     GENTAMICIN 8 INTERMEDIATE Intermediate     IMIPENEM 1 SENSITIVE Sensitive     NITROFURANTOIN 128 RESISTANT Resistant     TRIMETH/SULFA >=320 RESISTANT Resistant     AMPICILLIN/SULBACTAM <=2 SENSITIVE Sensitive     PIP/TAZO <=4 SENSITIVE Sensitive     * >=100,000 COLONIES/mL PROTEUS MIRABILIS  Surgical pcr screen     Status: Abnormal   Collection Time: 08/19/15  6:41 AM  Result Value Ref Range Status   MRSA, PCR POSITIVE (A) NEGATIVE Final    Comment: RESULT CALLED TO, READ BACK BY AND VERIFIED WITH: TARTELOW RN AT [id number] ON 1.3.17 BY SHUEA    Staphylococcus aureus POSITIVE (A) NEGATIVE Final    Comment:        The Xpert SA Assay (FDA approved for NASAL specimens in patients over 20 years of age), is one component of a comprehensive surveillance program.  Test performance has been validated by [facility] for patients greater than or equal to 60 year old. It is not intended to diagnose infection nor to guide or monitor treatment.      Studies: Ct Pelvis Wo Contrast  08/18/2015  CLINICAL DATA:  Right proximal femur fracture. EXAM: CT PELVIS WITHOUT CONTRAST TECHNIQUE: Multidetector CT imaging of the pelvis was performed following the standard protocol without intravenous contrast. COMPARISON:  Radiographs of same day.  CT scan of February 27, 2014. FINDINGS: Mildly displaced fractures are seen involving the right superior and inferior pubic rami. Moderately displaced and comminuted fracture is seen involving the trochanteric region of the proximal right femur. No other fracture is noted in the pelvis or sacrum. Atherosclerosis of abdominal aorta and iliac arteries is noted. There is no evidence of bowel obstruction. Sigmoid diverticulosis is noted  without inflammation. Probable mild hemorrhage is seen along the right pelvic sidewall. Urinary bladder and uterus appear normal. IMPRESSION: Mildly displaced fractures are seen involving the right superior and inferior pubic rami. Moderately displaced and comminuted fracture of intertrochanteric region of proximal right femur is noted. Probable mild hematoma seen along right pelvic sidewall. Electronically Signed   By: Marijo Conception, M.D.   On: 08/18/2015 15:35   Dg Femur, Min 2 Views Right  08/18/2015  CLINICAL DATA:  Right hip fracture.  Severe right hip pain. EXAM: RIGHT FEMUR 2 VIEWS COMPARISON:  None. FINDINGS: Osteopenia. The mid and distal femur are intact. Unremarkable soft tissues. IMPRESSION: No evidence of acute bony injury in the mid or distal femur. Electronically Signed   By: Marybelle Killings M.D.   On: 08/18/2015 14:55    Scheduled Meds: . divalproex  250 mg Oral BID  . famotidine  20 mg Oral Daily  . ferrous sulfate  325  mg Oral Daily  . lacosamide  200 mg Oral BID  . magnesium oxide  400 mg Oral Daily  . metoprolol tartrate  37.5 mg Oral BID  . polyethylene glycol  17 g Oral Daily  . trimethoprim  100 mg Oral Daily  . [START ON 08/21/2015] vancomycin  1,000 mg Intravenous To OR   Continuous Infusions: . sodium chloride 50 mL/hr at 08/19/15 1743  . heparin 500 Units/hr (08/20/15 0810)    Principal Problem:   Closed right hip fracture (HCC) Active Problems:   Pulmonary embolism (HCC)   Gastrostomy tube in place ([place])   Seizures ([place])   H/O: stroke with residual effects   AKI (acute kidney injury) ([place])   Fracture of multiple pubic rami ([place])   Aortic atherosclerosis ([place])   Fall   Urinary tract infection due to Proteus    Time spent: 77 mins    [facility] MD [facility] Pager [phone]. If 7PM-7AM, please contact night-coverage at [email or web address], password [facility] 08/20/2015, 2:36 PM  LOS: 2 days

## 2015-08-21 ENCOUNTER — Encounter (HOSPITAL_COMMUNITY)
Admission: EM | Disposition: A | Discharge status: Skilled Nursing Facility | Payer: Self-pay | Source: Home / Self Care | Attending: Internal Medicine

## 2015-08-21 ENCOUNTER — Inpatient Hospital Stay (HOSPITAL_COMMUNITY): Payer: Medicare Other | Admitting: Anesthesiology

## 2015-08-21 ENCOUNTER — Inpatient Hospital Stay (HOSPITAL_COMMUNITY): Payer: Medicare Other

## 2015-08-21 ENCOUNTER — Encounter (HOSPITAL_COMMUNITY): Payer: Self-pay | Admitting: Registered Nurse

## 2015-08-21 DIAGNOSIS — S72141A Displaced intertrochanteric fracture of right femur, initial encounter for closed fracture: Secondary | ICD-10-CM | POA: Diagnosis present

## 2015-08-21 HISTORY — PX: FEMUR IM NAIL: SHX1597

## 2015-08-21 LAB — CBC
HCT: 24.3 % — ABNORMAL LOW (ref 36.0–46.0)
HCT: 21.6 % — ABNORMAL LOW (ref 36.0–46.0)
HEMOGLOBIN: 8.7 g/dL — AB (ref 12.0–15.0)
Hemoglobin: 7.5 g/dL — ABNORMAL LOW (ref 12.0–15.0)
MCH: 31 pg (ref 26.0–34.0)
MCH: 29.9 pg (ref 26.0–34.0)
MCHC: 35.8 g/dL (ref 30.0–36.0)
MCHC: 34.7 g/dL (ref 30.0–36.0)
MCV: 86.5 fL (ref 78.0–100.0)
MCV: 86.1 fL (ref 78.0–100.0)
PLATELETS: 125 10*3/uL — AB (ref 150–400)
PLATELETS: 102 10*3/uL — AB (ref 150–400)
RBC: 2.81 MIL/uL — AB (ref 3.87–5.11)
RBC: 2.51 MIL/uL — ABNORMAL LOW (ref 3.87–5.11)
RDW: 14.9 % (ref 11.5–15.5)
RDW: 14.8 % (ref 11.5–15.5)
WBC: 8 10*3/uL (ref 4.0–10.5)
WBC: 10.9 10*3/uL — ABNORMAL HIGH (ref 4.0–10.5)

## 2015-08-21 LAB — BASIC METABOLIC PANEL
ANION GAP: 8 (ref 5–15)
BUN: 23 mg/dL — AB (ref 6–20)
CHLORIDE: 108 mmol/L (ref 101–111)
CO2: 24 mmol/L (ref 22–32)
Calcium: 8.4 mg/dL — ABNORMAL LOW (ref 8.9–10.3)
Creatinine, Ser: 1.03 mg/dL — ABNORMAL HIGH (ref 0.44–1.00)
GFR calc Af Amer: 59 mL/min — ABNORMAL LOW (ref >60)
GFR, EST NON AFRICAN AMERICAN: 51 mL/min — AB (ref >60)
Glucose, Bld: 113 mg/dL — ABNORMAL HIGH (ref 65–99)
POTASSIUM: 4.7 mmol/L (ref 3.5–5.1)
SODIUM: 140 mmol/L (ref 135–145)

## 2015-08-21 LAB — FERRITIN: Ferritin: 359 ng/mL — ABNORMAL HIGH (ref 11–307)

## 2015-08-21 LAB — IRON AND TIBC
Iron: 13 ug/dL — ABNORMAL LOW (ref 28–170)
SATURATION RATIOS: 7 % — AB (ref 10.4–31.8)
TIBC: 199 ug/dL — ABNORMAL LOW (ref 250–450)
UIBC: 186 ug/dL

## 2015-08-21 LAB — PROTIME-INR
INR: 1.29 (ref 0.00–1.49)
PROTHROMBIN TIME: 16.3 s — AB (ref 11.6–15.2)

## 2015-08-21 LAB — VITAMIN B12: VITAMIN B 12: 566 pg/mL (ref 180–914)

## 2015-08-21 LAB — PREPARE RBC (CROSSMATCH)

## 2015-08-21 LAB — FOLATE: FOLATE: 15.3 ng/mL (ref >5.9)

## 2015-08-21 SURGERY — INSERTION, INTRAMEDULLARY ROD, FEMUR, RETROGRADE
Anesthesia: General | Site: Hip | Laterality: Right

## 2015-08-21 MED ORDER — FENTANYL CITRATE (PF) 100 MCG/2ML IJ SOLN
INTRAMUSCULAR | Status: DC | PRN
  Administered 2015-08-21: 12.5 ug via INTRAVENOUS
  Administered 2015-08-21: 50 ug via INTRAVENOUS

## 2015-08-21 MED ORDER — SENNA 8.6 MG PO TABS
1.0000 | ORAL_TABLET | Freq: Two times a day (BID) | ORAL | Status: DC
  Administered 2015-08-22: 8.6 mg via ORAL

## 2015-08-21 MED ORDER — SODIUM CHLORIDE 0.9 % IV SOLN
500.0000 mg | Freq: Three times a day (TID) | INTRAVENOUS | Status: DC
  Administered 2015-08-21 – 2015-08-24 (×8): 500 mg via INTRAVENOUS
  Filled 2015-08-21 (×11): qty 2

## 2015-08-21 MED ORDER — FENTANYL CITRATE (PF) 100 MCG/2ML IJ SOLN: 25.0000 ug | INTRAMUSCULAR | Status: DC | PRN

## 2015-08-21 MED ORDER — APIXABAN 2.5 MG PO TABS
2.5000 mg | ORAL_TABLET | Freq: Two times a day (BID) | ORAL | Status: DC
  Administered 2015-08-22 – 2015-08-24 (×4): 2.5 mg via ORAL
  Filled 2015-08-21 (×6): qty 1

## 2015-08-21 MED ORDER — CHLORHEXIDINE GLUCONATE CLOTH 2 % EX PADS
6.0000 | MEDICATED_PAD | Freq: Every day | CUTANEOUS | Status: DC
  Administered 2015-08-21: 6 via TOPICAL

## 2015-08-21 MED ORDER — SODIUM CHLORIDE 0.9 % IR SOLN
Status: DC | PRN
  Administered 2015-08-21: 1000 mL

## 2015-08-21 MED ORDER — METOCLOPRAMIDE HCL 10 MG PO TABS: 5.0000 mg | ORAL_TABLET | Freq: Three times a day (TID) | ORAL | Status: DC | PRN

## 2015-08-21 MED ORDER — LIDOCAINE HCL (CARDIAC) 20 MG/ML IV SOLN
INTRAVENOUS | Status: AC
  Filled 2015-08-21: qty 5

## 2015-08-21 MED ORDER — LACTATED RINGERS IV SOLN
INTRAVENOUS | Status: DC
  Administered 2015-08-21: 1000 mL via INTRAVENOUS
  Administered 2015-08-21: 20:00:00 via INTRAVENOUS

## 2015-08-21 MED ORDER — MENTHOL 3 MG MT LOZG: 1.0000 | LOZENGE | OROMUCOSAL | Status: DC | PRN

## 2015-08-21 MED ORDER — METOPROLOL TARTRATE 1 MG/ML IV SOLN
INTRAVENOUS | Status: AC
  Filled 2015-08-21: qty 5

## 2015-08-21 MED ORDER — SODIUM CHLORIDE 0.9 % IV SOLN
Freq: Once | INTRAVENOUS | Status: AC
  Administered 2015-08-21: via INTRAVENOUS

## 2015-08-21 MED ORDER — VANCOMYCIN HCL IN DEXTROSE 1-5 GM/200ML-% IV SOLN: 1000.0000 mg | Freq: Two times a day (BID) | INTRAVENOUS | Status: DC

## 2015-08-21 MED ORDER — HYDROCODONE-ACETAMINOPHEN 5-325 MG PO TABS: 1.0000 | ORAL_TABLET | Freq: Four times a day (QID) | ORAL | Status: DC | PRN

## 2015-08-21 MED ORDER — FENTANYL CITRATE (PF) 100 MCG/2ML IJ SOLN
INTRAMUSCULAR | Status: AC
  Filled 2015-08-21: qty 2

## 2015-08-21 MED ORDER — ACETAMINOPHEN 325 MG PO TABS: 325.0000 mg | ORAL_TABLET | ORAL | Status: DC | PRN

## 2015-08-21 MED ORDER — OXYCODONE HCL 5 MG PO TABS: 5.0000 mg | ORAL_TABLET | Freq: Once | ORAL | Status: DC | PRN

## 2015-08-21 MED ORDER — PHENYLEPHRINE 40 MCG/ML (10ML) SYRINGE FOR IV PUSH (FOR BLOOD PRESSURE SUPPORT)
PREFILLED_SYRINGE | INTRAVENOUS | Status: AC
  Filled 2015-08-21: qty 10

## 2015-08-21 MED ORDER — MUPIROCIN 2 % EX OINT
1.0000 "application " | TOPICAL_OINTMENT | Freq: Two times a day (BID) | CUTANEOUS | Status: DC
  Administered 2015-08-21 – 2015-08-24 (×5): 1 via NASAL
  Filled 2015-08-21 (×2): qty 22

## 2015-08-21 MED ORDER — LIDOCAINE HCL (CARDIAC) 20 MG/ML IV SOLN
INTRAVENOUS | Status: DC | PRN
  Administered 2015-08-21: 50 mg via INTRAVENOUS

## 2015-08-21 MED ORDER — ONDANSETRON HCL 4 MG/2ML IJ SOLN
INTRAMUSCULAR | Status: AC
  Filled 2015-08-21: qty 2

## 2015-08-21 MED ORDER — ACETAMINOPHEN 160 MG/5ML PO SOLN: 325.0000 mg | ORAL | Status: DC | PRN

## 2015-08-21 MED ORDER — OXYCODONE HCL 5 MG/5ML PO SOLN
5.0000 mg | Freq: Once | ORAL | Status: DC | PRN
  Filled 2015-08-21: qty 5

## 2015-08-21 MED ORDER — DOCUSATE SODIUM 100 MG PO CAPS
100.0000 mg | ORAL_CAPSULE | Freq: Two times a day (BID) | ORAL | Status: DC
  Administered 2015-08-22 (×2): 100 mg via ORAL

## 2015-08-21 MED ORDER — ONDANSETRON HCL 4 MG/2ML IJ SOLN
INTRAMUSCULAR | Status: DC | PRN
  Administered 2015-08-21: 4 mg via INTRAVENOUS

## 2015-08-21 MED ORDER — PROPOFOL 10 MG/ML IV BOLUS
INTRAVENOUS | Status: AC
  Filled 2015-08-21: qty 20

## 2015-08-21 MED ORDER — PHENYLEPHRINE HCL 10 MG/ML IJ SOLN
INTRAMUSCULAR | Status: DC | PRN
  Administered 2015-08-21 (×2): 40 ug via INTRAVENOUS
  Administered 2015-08-21: 80 ug via INTRAVENOUS
  Administered 2015-08-21 (×3): 40 ug via INTRAVENOUS
  Administered 2015-08-21: 80 ug via INTRAVENOUS

## 2015-08-21 MED ORDER — SODIUM CHLORIDE 0.9 % IV SOLN: Freq: Once | INTRAVENOUS | Status: DC

## 2015-08-21 MED ORDER — METOCLOPRAMIDE HCL 5 MG/ML IJ SOLN: 5.0000 mg | Freq: Three times a day (TID) | INTRAMUSCULAR | Status: DC | PRN

## 2015-08-21 MED ORDER — METOPROLOL TARTRATE 1 MG/ML IV SOLN
5.0000 mg | Freq: Once | INTRAVENOUS | Status: AC
  Administered 2015-08-21: 5 mg via INTRAVENOUS

## 2015-08-21 MED ORDER — SODIUM CHLORIDE 0.9 % IV SOLN
INTRAVENOUS | Status: DC
  Administered 2015-08-22: 50 mL/h via INTRAVENOUS

## 2015-08-21 MED ORDER — PHENOL 1.4 % MT LIQD
1.0000 | OROMUCOSAL | Status: DC | PRN
  Filled 2015-08-21: qty 177

## 2015-08-21 MED ORDER — PROPOFOL 10 MG/ML IV BOLUS
INTRAVENOUS | Status: DC | PRN
  Administered 2015-08-21: 80 mg via INTRAVENOUS

## 2015-08-21 SURGICAL SUPPLY — 38 items
BAG ZIPLOCK 12X15 (MISCELLANEOUS) IMPLANT
BIT DRILL CALIBRATED 4.2 (BIT) ×1 IMPLANT
CHLORAPREP W/TINT 26ML (MISCELLANEOUS) ×3 IMPLANT
COVER PERINEAL POST (MISCELLANEOUS) ×3 IMPLANT
DRAPE C-ARM 42X120 X-RAY (DRAPES) ×3 IMPLANT
DRAPE C-ARMOR (DRAPES) ×3 IMPLANT
DRAPE ORTHO SPLIT 77X108 STRL (DRAPES)
DRAPE STERI IOBAN 125X83 (DRAPES) ×3 IMPLANT
DRAPE SURG ORHT 6 SPLT 77X108 (DRAPES) IMPLANT
DRAPE U-SHAPE 47X51 STRL (DRAPES) ×6 IMPLANT
DRILL BIT CALIBRATED 4.2 (BIT) ×3
DRSG MEPILEX BORDER 4X4 (GAUZE/BANDAGES/DRESSINGS) ×3 IMPLANT
DRSG MEPILEX BORDER 4X8 (GAUZE/BANDAGES/DRESSINGS) ×3 IMPLANT
ELECT BLADE TIP CTD 4 INCH (ELECTRODE) ×3 IMPLANT
GAUZE SPONGE 4X4 12PLY STRL (GAUZE/BANDAGES/DRESSINGS) ×3 IMPLANT
GLOVE BIOGEL PI IND STRL 8.5 (GLOVE) ×1 IMPLANT
GLOVE BIOGEL PI INDICATOR 8.5 (GLOVE) ×2
GLOVE SURG ORTHO 8.5 STRL (GLOVE) ×9 IMPLANT
GOWN SPEC L3 XXLG W/TWL (GOWN DISPOSABLE) ×6 IMPLANT
GUIDEWIRE 3.2X400 (WIRE) ×6 IMPLANT
IMPL DEG TI CANN 11MM/130 (Orthopedic Implant) ×1 IMPLANT
IMPLANT DEG TI CANN 11MM/130 (Orthopedic Implant) ×3 IMPLANT
KIT BASIN OR (CUSTOM PROCEDURE TRAY) ×3 IMPLANT
LIQUID BAND (GAUZE/BANDAGES/DRESSINGS) ×3 IMPLANT
MANIFOLD NEPTUNE II (INSTRUMENTS) ×3 IMPLANT
MARKER SKIN DUAL TIP RULER LAB (MISCELLANEOUS) ×3 IMPLANT
PACK TOTAL JOINT (CUSTOM PROCEDURE TRAY) ×3 IMPLANT
REAMER ROD DEEP FLUTE 2.5X950 (INSTRUMENTS) ×3 IMPLANT
SCREW CANN LOCK TI FT 5X30 (Screw) ×3 IMPLANT
SCREW LOCKING 5.0X32MM (Screw) ×3 IMPLANT
SUT MNCRL AB 3-0 PS2 18 (SUTURE) ×6 IMPLANT
SUT VIC AB 1 CT1 27 (SUTURE) ×4
SUT VIC AB 1 CT1 27XBRD ANTBC (SUTURE) ×2 IMPLANT
SUT VIC AB 2-0 CT1 27 (SUTURE) ×4
SUT VIC AB 2-0 CT1 27XBRD (SUTURE) ×2 IMPLANT
TFNA SCREW 80MM ×3 IMPLANT
TFNA screw 80mm ×2 IMPLANT
YANKAUER SUCT BULB TIP NO VENT (SUCTIONS) IMPLANT

## 2015-08-21 NOTE — Interval H&P Note (Signed)
History and Physical Interval Note:  08/21/2015 4:08 PM  Tanya White  has presented today for surgery, with the diagnosis of right intertrochanteriv fracture  The various methods of treatment have been discussed with the patient and family. After consideration of risks, benefits and other options for treatment, the patient has consented to  Intramedullary fixation of right pertrochanteric femur fracture as a surgical intervention .  The patient's history has been reviewed, patient examined, no change in status, stable for surgery.  I have reviewed the patient's chart and labs.  Questions were answered to the patient's satisfaction.     Mirel Hundal, Horald Pollen    The risks, benefits, and alternatives were discussed with the patient / daughter. There are risks associated with the surgery including, but not limited to, problems with anesthesia (death), infection, differences in leg length/angulation/rotation, fracture of bones, loosening or failure of implants, malunion, nonunion, hematoma (blood accumulation) which may require surgical drainage, blood clots, pulmonary embolism, nerve injury (foot drop), and blood vessel injury. The patient / daughter understand these risks and elects to proceed.

## 2015-08-21 NOTE — Discharge Instructions (Signed)
Dr. Rod Can Adult Hip & Knee Specialist [facility] [address], [place], [place] [address] [phone]   POSTOPERATIVE DIRECTIONS    Hip Rehabilitation, Guidelines Following Surgery   WEIGHT BEARING Other:  touch down weight bearing right leg -> bed to chair transfers   [place] items at home which could result in a fall. This includes throw rugs or furniture in walking pathways.  Continue medications as instructed at time of discharge.  You may have some home medications which will be placed on hold until you complete the course of blood thinner medication.  4 days after discharge, you may start showering. No tub baths or soaking your incisions. Do not put on socks or shoes without following the instructions of your caregivers.   Sit on chairs with arms. Use the chair arms to help push yourself up when arising.  Arrange for the use of a toilet seat elevator so you are not sitting low.   Walk with walker as instructed.  You may resume a sexual relationship in one month or when given the OK by your caregiver.  Use walker as long as suggested by your caregivers.  Avoid periods of inactivity such as sitting longer than an hour when not asleep. This helps prevent blood clots.  You may return to work once you are cleared by Engineer, production.  Do not drive a car for 6 weeks or until released by your surgeon.  Do not drive while taking narcotics.  Wear elastic stockings for two weeks following surgery during the day but you may remove then at night.  Make sure you keep all of your appointments after your operation with all of your doctors and caregivers. You should call the office at the above phone number and make an appointment for approximately two weeks after the date of your surgery. Please pick up a stool softener and laxative for home use as long as you are requiring pain medications.  ICE to the affected hip every three  hours for 30 minutes at a time and then as needed for pain and swelling. Continue to use ice on the hip for pain and swelling from surgery. You may notice swelling that will progress down to the foot and ankle.  This is normal after surgery.  Elevate the leg when you are not up walking on it.   It is important for you to complete the blood thinner medication as prescribed by your doctor.  Continue to use the breathing machine which will help keep your temperature down.  It is common for your temperature to cycle up and down following surgery, especially at night when you are not up moving around and exerting yourself.  The breathing machine keeps your lungs expanded and your temperature down.  RANGE OF MOTION AND STRENGTHENING EXERCISES  These exercises are designed to help you keep full movement of your hip joint. Follow your caregiver's or physical therapist's instructions. Perform all exercises about fifteen times, three times per day or as directed. Exercise both hips, even if you have had only one joint replacement. These exercises can be done on a training (exercise) mat, on the floor, on a table or on a bed. Use whatever works the best and is most comfortable for you. Use music or television while you are exercising so that the exercises are a pleasant break in your day. This will make your life better with the exercises acting as a break in routine you can  look forward to.  Lying on your back, slowly slide your foot toward your buttocks, raising your knee up off the floor. Then slowly slide your foot back down until your leg is straight again.  Lying on your back spread your legs as far apart as you can without causing discomfort.  Lying on your back, tighten up the muscle in the front of your thigh (quadriceps muscles). You can do this by keeping your leg straight and trying to raise your heel off the floor. This helps strengthen the largest muscle supporting your knee.  Lying on your back,  tighten up the muscles of your buttocks both with the legs straight and with the knee bent at a comfortable angle while keeping your heel on the floor.   SKILLED REHAB INSTRUCTIONS: If the patient is transferred to a skilled rehab facility following release from the hospital, a list of the current medications will be sent to the facility for the patient to continue.  When discharged from the skilled rehab facility, please have the facility set up the patient's [place] prior to being released. Also, the skilled facility will be responsible for providing the patient with their medications at time of release from the facility to include their pain medication and their blood thinner medication. If the patient is still at the rehab facility at time of the two week follow up appointment, the skilled rehab facility will also need to assist the patient in arranging follow up appointment in our office and any transportation needs.  MAKE SURE YOU:  Understand these instructions.  Will watch your condition.  Will get help right away if you are not doing well or get worse.  Pick up stool softner and laxative for home use following surgery while on pain medications. Daily dry dressing changes as needed. In 4 days, you may remove your dressings and begin taking showers - no tub baths or soaking the incisions. Continue to use ice for pain and swelling after surgery. Do not use any lotions or creams on the incision until instructed by your surgeon.

## 2015-08-21 NOTE — Transfer of Care (Signed)
Immediate Anesthesia Transfer of Care Note  Patient: Tanya White  Procedure(s) Performed: Procedure(s): INTRAMEDULLARY (IM) RETROGRADE FEMORAL NAILING (Right)  Patient Location: PACU  Anesthesia Type:General  Level of Consciousness: awake, alert , oriented and patient cooperative  Airway & Oxygen Therapy: Patient Spontanous Breathing and Patient connected to face mask oxygen  Post-op Assessment: Report given to RN, Post -op Vital signs reviewed and stable and Patient moving all extremities  Post vital signs: Reviewed and stable  Last Vitals:  Filed Vitals:   08/21/15 0512 08/21/15 1403  BP: 108/76 145/88  Pulse: 88 98  Temp: 36.8 C 37.3 C  Resp: 16 18    Complications: No apparent anesthesia complications

## 2015-08-21 NOTE — NC FL2 (Signed)
MEDICAID FL2 LEVEL OF CARE SCREENING TOOL     IDENTIFICATION  Patient Name: Tanya White Birthdate: Jan 24, 1936 Sex: female Admission Date (Current Location): 08/18/2015  [facility] and [place] Number:  Herbalist and Address:  [facility],  [address] [place], [place]      Provider Number: [id number]  Attending Physician Name and Address:  Eugenie Filler, MD  Relative Name and Phone Number:       Current Level of Care: Hospital Recommended Level of Care: [place] Prior Approval Number:    Date Approved/Denied:   PASRR Number: [id number]  Discharge Plan: SNF    Current Diagnoses: Patient Active Problem List   Diagnosis Date Noted  . Urinary tract infection due to Proteus 08/20/2015  . Pelvic ring fracture ([place])   . Closed right hip fracture ([place]) 08/18/2015  . H/O: stroke with residual effects 08/18/2015  . AKI (acute kidney injury) ([place]) 08/18/2015  . Fracture of multiple pubic rami ([place]) 08/18/2015  . Aortic atherosclerosis ([place]) 08/18/2015  . Fall 08/18/2015  . Seizures ([place]) 03/09/2015  . Dysphasia 07/16/2014  . Pulmonary embolism ([place])   . Gastrostomy tube in place [facility])   . Memory loss     Orientation RESPIRATION BLADDER Height & Weight     (disoriented x 4)  O2 Incontinent 4\' 9"  (144.8 cm) 95 lbs.  BEHAVIORAL SYMPTOMS/MOOD NEUROLOGICAL BOWEL NUTRITION STATUS   (Pt can be uncooperative with care at times.)   Incontinent Feeding tube  AMBULATORY STATUS COMMUNICATION OF NEEDS Skin   Extensive Assist Verbally Surgical wounds                       Personal Care Assistance Level of Assistance  Total care Bathing Assistance: Maximum assistance     Total Care Assistance: Maximum assistance   Functional Limitations Info  Sight, Hearing, Speech Sight Info: Adequate Hearing Info: Adequate Speech Info: Adequate    SPECIAL CARE FACTORS FREQUENCY  PT (By licensed PT)     PT Frequency: 5  x wk              Contractures Contractures Info: Not present    Additional Factors Info  Code Status Code Status Info: Full Code             Current Medications (08/21/2015):  This is the current hospital active medication list Current Facility-Administered Medications  Medication Dose Route Frequency Provider Last Rate Last Dose  . 0.9 %  sodium chloride infusion   Intravenous Continuous Venetia Maxon Rama, MD 50 mL/hr at 08/20/15 2042    . [MAR Hold] acetaminophen (TYLENOL) tablet 650 mg  650 mg Oral Q6H PRN Venetia Maxon Rama, MD   650 mg at 08/19/15 2223   Or  . [MAR Hold] acetaminophen (TYLENOL) suppository 650 mg  650 mg Rectal Q6H PRN Venetia Maxon Rama, MD      . Doug Sou Hold] albuterol (PROVENTIL) (2.5 MG/3ML) 0.083% nebulizer solution 2.5 mg  2.5 mg Nebulization Q2H PRN Venetia Maxon Rama, MD      . Doug Sou Hold] alum & mag hydroxide-simeth (MAALOX/MYLANTA) 200-200-20 MG/5ML suspension 30 mL  30 mL Oral Q6H PRN Venetia Maxon Rama, MD      . Doug Sou Hold] ampicillin (OMNIPEN) 500 mg in sodium chloride 0.9 % 50 mL IVPB  500 mg Intravenous 3 times per day Eugenie Filler, MD      . Doug Sou Hold] bisacodyl (DULCOLAX) suppository 10 mg  10 mg  Rectal Daily PRN Venetia Maxon Rama, MD      . Doug Sou Hold] Chlorhexidine Gluconate Cloth 2 % PADS 6 each  6 each Topical Q0600 Eugenie Filler, MD   6 each at 08/21/15 [phone]  . [MAR Hold] divalproex (DEPAKOTE) DR tablet 250 mg  250 mg Oral BID Venetia Maxon Rama, MD   250 mg at 08/21/15 0947  . [MAR Hold] famotidine (PEPCID) tablet 20 mg  20 mg Oral Daily Venetia Maxon Rama, MD   20 mg at 08/21/15 0947  . [MAR Hold] feeding supplement (BOOST / RESOURCE BREEZE) liquid 1 Container  1 Container Oral TID BM Clayton Bibles, RD   1 Container at 08/20/15 2033  . [MAR Hold] ferrous sulfate tablet 325 mg  325 mg Oral Daily Venetia Maxon Rama, MD   325 mg at 08/21/15 0947  . [MAR Hold] lacosamide (VIMPAT) tablet 200 mg  200 mg Oral BID Venetia Maxon Rama, MD   200 mg at 08/21/15  0947  . [MAR Hold] magnesium oxide (MAG-OX) tablet 400 mg  400 mg Oral Daily Venetia Maxon Rama, MD   400 mg at 08/21/15 0946  . [MAR Hold] methocarbamol (ROBAXIN) tablet 500 mg  500 mg Oral Q6H PRN Venetia Maxon Rama, MD       Or  . Doug Sou Hold] methocarbamol (ROBAXIN) 500 mg in dextrose 5 % 50 mL IVPB  500 mg Intravenous Q6H PRN Venetia Maxon Rama, MD      . Doug Sou Hold] metoprolol tartrate (LOPRESSOR) tablet 37.5 mg  37.5 mg Oral BID Venetia Maxon Rama, MD   37.5 mg at 08/20/15 2317  . [MAR Hold] morphine 2 MG/ML injection 0.5 mg  0.5 mg Intravenous Q2H PRN Venetia Maxon Rama, MD   0.5 mg at 08/20/15 2317  . [MAR Hold] mupirocin ointment (BACTROBAN) 2 % 1 application  1 application Nasal BID Eugenie Filler, MD   1 application at [phone]  . [MAR Hold] ondansetron (ZOFRAN) tablet 4 mg  4 mg Oral Q6H PRN Venetia Maxon Rama, MD       Or  . Doug Sou Hold] ondansetron (ZOFRAN) injection 4 mg  4 mg Intravenous Q6H PRN Venetia Maxon Rama, MD      . Doug Sou Hold] polyethylene glycol (MIRALAX / GLYCOLAX) packet 17 g  17 g Oral Daily Venetia Maxon Rama, MD   17 g at 08/20/15 1100  . vancomycin (VANCOCIN) IVPB 1000 mg/200 mL premix  1,000 mg Intravenous To OR Rod Can, MD         Discharge Medications: Please see discharge summary for a list of discharge medications.  Relevant Imaging Results:  Relevant Lab Results:   Additional Information SS # [id number]. MRSA + surgical pcr 08/19/15  Matteson Blue, Randall An, LCSW

## 2015-08-21 NOTE — Brief Op Note (Signed)
08/18/2015 - 08/21/2015  7:41 PM  PATIENT:  Tanya White  80 y.o. female  PRE-OPERATIVE DIAGNOSIS:  right introchanteric fracture  POST-OPERATIVE DIAGNOSIS:  right introchanteric fracture  PROCEDURE:  Procedure(s): INTRAMEDULLARY (IM) RETROGRADE FEMORAL NAILING (Right)  SURGEON:  Surgeon(s) and Role:    * Rod Can, MD - Primary  PHYSICIAN ASSISTANT: none  ASSISTANTS: staff   ANESTHESIA:   general  EBL:   150 mL.  BLOOD ADMINISTERED:none  DRAINS: none   LOCAL MEDICATIONS USED:  NONE  SPECIMEN:  No Specimen  DISPOSITION OF SPECIMEN:  N/A  COUNTS:  YES  TOURNIQUET:  * No tourniquets in log *  DICTATION: .Other Dictation: Dictation Number [id number]  PLAN OF CARE: Admit to inpatient   PATIENT DISPOSITION:  PACU - hemodynamically stable.   Delay start of Pharmacological VTE agent (>24hrs) due to surgical blood loss or risk of bleeding: no

## 2015-08-21 NOTE — Care Management Important Message (Addendum)
Important Message  Patient Details IM Letter given to Suzanne/Case Manager to present to Patient Name: Tanya White MRN: [id number] Date of Birth: 1936/08/06   Medicare Important Message Given:  Yes    Camillo Flaming 08/21/2015, 1:58 [place] Message  Patient Details  Name: Tanya White MRN: [id number] Date of Birth: Jan 03, 1936   Medicare Important Message Given:  Yes    Camillo Flaming 08/21/2015, 1:58 PM

## 2015-08-21 NOTE — H&P (View-Only) (Signed)
Tanya White is an 80 y.o. female.    Chief Complaint: right hip pain   HPI: 80 y/o female current resident at [facility] with ground level fall earlier today. C/o immediate pain to right hip/pelvis and inability to bear weight. Son states she has hx of recent falls. Pt c/o mild to moderate pain to right hip. Denies any other injuries. Currently stable. Pt currently taking eliquis.  PCP:  [name], MD  PMH: Past Medical History  Diagnosis Date  . Seizures ([place])   . Hypotension   . Pneumonia   . Stroke ([place])   . GI bleed   . Pulmonary embolism ([place])   . Gastrostomy tube in place [facility])   . Memory loss   . TTP (thrombotic thrombocytopenic purpura) (HCC)     Treated with PLEX in 2012 at [facility]: Past Surgical History  Procedure Laterality Date  . Appendectomy    . Jejunostomy feeding tube      Social History:  reports that she has quit smoking. She has never used smokeless tobacco. She reports that she does not drink alcohol or use illicit drugs.  Allergies:  Allergies  Allergen Reactions  . Codeine Shortness Of Breath  . Lipitor [Atorvastatin] Diarrhea and Other (See Comments)    GI upset, rhabdomyalosis   . Aspirin Diarrhea and Other (See Comments)    Only ASA 334m ; pt can tolerate low dose asa 861mReaction : GI upset   . Hctz [Hydrochlorothiazide] Rash  . Hydralazine Other (See Comments)    [facility]  . Reserpine Other (See Comments)    [facility]  . Tramadol Other (See Comments)    [facility]    Medications: No current facility-administered medications for this encounter.   Current Outpatient Prescriptions  Medication Sig Dispense Refill  . acetaminophen (TYLENOL) 325 MG tablet Take 2 tablets (650 mg total) by mouth every 6 (six) hours as needed for mild pain or fever.    . Marland Kitchenlbuterol (PROVENTIL) (2.5 MG/3ML) 0.083% nebulizer solution  Take 2.5 mg by nebulization every 2 (two) hours as needed for wheezing or shortness of breath.    . Marland Kitchenpixaban (ELIQUIS) 2.5 MG TABS tablet Take 1 tablet (2.5 mg total) by mouth 2 (two) times daily. 60 tablet 0  . Cranberry 250 MG TABS Take 250 mg by mouth daily.    . divalproex (DEPAKOTE) 250 MG DR tablet Take 250 mg by mouth 2 (two) times daily.    . famotidine (PEPCID) 20 MG tablet Take 20 mg by mouth daily.     . ferrous sulfate 325 (65 FE) MG tablet Take 1 tablet (325 mg total) by mouth daily.  3  . furosemide (LASIX) 40 MG tablet Take 1 tablet (40 mg total) by mouth daily as needed (for leg swelling.  Give PRN potassium when Lasix is given). 30 tablet   . lacosamide (VIMPAT) 200 MG TABS tablet Take 1 tablet (200 mg total) by mouth 2 (two) times daily. 60 tablet 11  . LORazepam (ATIVAN) 2 MG/ML injection Inject 1 mg into the muscle every 6 (six) hours as needed for seizure.    . magnesium oxide (MAG-OX) 400 MG tablet Take 1 tablet (400 mg total) by mouth daily.    . metoprolol tartrate (LOPRESSOR) 25 MG tablet Take 37.5 mg by mouth 2 (two) times daily.     . ondansetron (ZOFRAN) 4 MG tablet  Take 1 tablet (4 mg total) by mouth every 4 (four) hours as needed for nausea. 20 tablet 0  . potassium chloride SA (KLOR-CON M20) 20 MEQ tablet Take 1 tablet (20 mEq total) by mouth daily as needed (whenever she is given Lasix). (Patient taking differently: Take 20 mEq by mouth daily as needed (whenever lasix is administered). )    . PRESCRIPTION MEDICATION Apply 1 mg topically every 6 (six) hours as needed (agitation/anxiety). Ativan Gel 1 mg    . trimethoprim (TRIMPEX) 100 MG tablet Take 100 mg by mouth daily.       Results for orders placed or performed during the hospital encounter of 08/18/15 (from the past 48 hour(s))  CBC WITH DIFFERENTIAL     Status: Abnormal   Collection Time: 08/18/15  1:47 PM  Result Value Ref Range   WBC 9.1 4.0 - 10.5 K/uL   RBC 3.97 3.87 - 5.11 MIL/uL   Hemoglobin 11.9 (L)  12.0 - 15.0 g/dL   HCT 34.5 (L) 36.0 - 46.0 %   MCV 86.9 78.0 - 100.0 fL   MCH 30.0 26.0 - 34.0 pg   MCHC 34.5 30.0 - 36.0 g/dL   RDW 14.6 11.5 - 15.5 %   Platelets 156 150 - 400 K/uL   Neutrophils Relative % 79 %   Neutro Abs 7.2 1.7 - 7.7 K/uL   Lymphocytes Relative 11 %   Lymphs Abs 1.0 0.7 - 4.0 K/uL   Monocytes Relative 9 %   Monocytes Absolute 0.8 0.1 - 1.0 K/uL   Eosinophils Relative 1 %   Eosinophils Absolute 0.1 0.0 - 0.7 K/uL   Basophils Relative 0 %   Basophils Absolute 0.0 0.0 - 0.1 K/uL  Protime-INR     Status: Abnormal   Collection Time: 08/18/15  1:47 PM  Result Value Ref Range   Prothrombin Time 19.2 (H) 11.6 - 15.2 seconds   INR 1.61 (H) 0.00 - 1.49  Comprehensive metabolic panel     Status: Abnormal   Collection Time: 08/18/15  1:47 PM  Result Value Ref Range   Sodium 139 135 - 145 mmol/L   Potassium 4.4 3.5 - 5.1 mmol/L   Chloride 103 101 - 111 mmol/L   CO2 27 22 - 32 mmol/L   Glucose, Bld 148 (H) 65 - 99 mg/dL   BUN 26 (H) 6 - 20 mg/dL   Creatinine, Ser 1.21 (H) 0.44 - 1.00 mg/dL   Calcium 8.9 8.9 - 10.3 mg/dL   Total Protein 7.2 6.5 - 8.1 g/dL   Albumin 4.0 3.5 - 5.0 g/dL   AST 25 15 - 41 U/L   ALT 14 14 - 54 U/L   Alkaline Phosphatase 51 38 - 126 U/L   Total Bilirubin 0.6 0.3 - 1.2 mg/dL   GFR calc non Af Amer 42 (L) >60 mL/min   GFR calc Af Amer 49 (L) >60 mL/min    Comment: (NOTE) The eGFR has been calculated using the CKD EPI equation. This calculation has not been validated in all clinical situations. eGFR's persistently <60 mL/min signify possible Chronic Kidney Disease.    Anion gap 9 5 - 15  Type and screen [place]     Status: None   Collection Time: 08/18/15  1:48 PM  Result Value Ref Range   ABO/RH(D) B POS    Antibody Screen NEG    Sample Expiration 08/21/2015   I-stat troponin, ED     Status: None   Collection Time: 08/18/15  1:59 PM  Result Value Ref Range   Troponin i, poc 0.00 0.00 - 0.08 ng/mL    Comment 3            Comment: Due to the release kinetics of cTnI, a negative result within the first hours of the onset of symptoms does not rule out myocardial infarction with certainty. If myocardial infarction is still suspected, repeat the test at appropriate intervals.    Dg Chest 1 View  08/18/2015  CLINICAL DATA:  Preop EXAM: CHEST 1 VIEW COMPARISON:  06/08/2015 FINDINGS: Moderate cardiomegaly. Aorta is tortuous with atherosclerotic calcification. No consolidation or lung mass. Bibasilar nodular densities are likely nipple shadows. IMPRESSION: Cardiomegaly without decompensation. Electronically Signed   By: Marybelle Killings M.D.   On: 08/18/2015 13:29   Dg Hip Unilat With Pelvis 2-3 Views Right  08/18/2015  CLINICAL DATA:  Acute right hip pain following fall today. Initial encounter. EXAM: DG HIP (WITH OR WITHOUT PELVIS) 2-3V RIGHT COMPARISON:  None. FINDINGS: A comminuted intertrochanteric fracture of the proximal right femur is noted with varus angulation. There is no evidence of subluxation or dislocation. Fractures of the superior and inferior right pubic rami are also noted. Degenerative changes in the left hip are present. IMPRESSION: Comminuted intertrochanteric right femur fracture with varus angulation. Superior and inferior right pubic rami fractures. Electronically Signed   By: Margarette Canada M.D.   On: 08/18/2015 13:28    ROS: ROS Inability to bear weight currently  Resident at [facility] currently  Physical Exam: Alert and appropriate 80 y/o female in no acute distress Cervical spine with full rom and no tenderness Bilateral upper extremities with full rom, no tenderness or deformity Right lower extremity: moderate shortening and external rotation nv intact distally No rashes or edema Left lower extremity with full rom, no tenderness Pelvis tender on exam  Physical Exam   Assessment/Plan Assessment: right femur, inferior and superior rami fractures  Plan: Medical team to  admit  Plan for surgical management of the right femur fracture but will need to delay 48-72 hours due to the eliquis Strict bedrest and will order foley Pain management - pt does not tolerate narcotics very well due to decreased mentation Will continue to monitor her progress

## 2015-08-21 NOTE — Anesthesia Procedure Notes (Signed)
Procedure Name: Intubation Date/Time: 08/21/2015 4:51 PM Performed by: Carleene Cooper A Pre-anesthesia Checklist: Patient identified, Timeout performed, Emergency Drugs available, Suction available and Patient being monitored Patient Re-evaluated:Patient Re-evaluated prior to inductionOxygen Delivery Method: Circle system utilized Preoxygenation: Pre-oxygenation with 100% oxygen Intubation Type: IV induction Ventilation: Mask ventilation without difficulty Laryngoscope Size: Mac and 3 Grade View: Grade I Tube type: Oral Tube size: 7.0 mm Number of attempts: 1 Airway Equipment and Method: Stylet Placement Confirmation: breath sounds checked- equal and bilateral,  ETT inserted through vocal cords under direct vision and positive ETCO2 Secured at: 21 cm Tube secured with: Tape Dental Injury: Teeth and Oropharynx as per pre-operative assessment

## 2015-08-21 NOTE — Anesthesia Preprocedure Evaluation (Addendum)
Anesthesia Evaluation  Patient identified by MRN, date of birth, ID band Patient awake    Reviewed: Allergy & Precautions, NPO status , Patient's Chart, lab work & pertinent test results, reviewed documented beta blocker date and time   History of Anesthesia Complications Negative for: history of anesthetic complications  Airway Mallampati: III  TM Distance: >3 FB Neck ROM: Full    Dental  (+) Poor Dentition, Missing   Pulmonary neg shortness of breath, neg sleep apnea, neg COPD, neg recent URI, former smoker, PE   breath sounds clear to auscultation       Cardiovascular hypertension, Pt. on home beta blockers + Peripheral Vascular Disease   Rhythm:Regular     Neuro/Psych Seizures -, Well Controlled,  CVA, Residual Symptoms negative psych ROS   GI/Hepatic negative GI ROS, Neg liver ROS,   Endo/Other  negative endocrine ROS  Renal/GU Renal InsufficiencyRenal disease     Musculoskeletal   Abdominal   Peds  Hematology  (+) anemia ,   Anesthesia Other Findings   Reproductive/Obstetrics                            Anesthesia Physical Anesthesia Plan  ASA: III  Anesthesia Plan: General   Post-op Pain Management:    Induction: Intravenous  Airway Management Planned: Oral ETT  Additional Equipment: None  Intra-op Plan:   Post-operative Plan: Extubation in OR and Possible Post-op intubation/ventilation  Informed Consent: I have reviewed the patients History and Physical, chart, labs and discussed the procedure including the risks, benefits and alternatives for the proposed anesthesia with the patient or authorized representative who has indicated his/her understanding and acceptance.   Dental advisory given  Plan Discussed with: CRNA and Surgeon  Anesthesia Plan Comments:        Anesthesia Quick Evaluation

## 2015-08-21 NOTE — Progress Notes (Signed)
ANTICOAGULATION CONSULT NOTE - F/u Consult  Pharmacy Consult for IV Heparin Indication: Hx PE - bridge from apixaban  Allergies  Allergen Reactions  . Codeine Shortness Of Breath  . Lipitor [Atorvastatin] Diarrhea and Other (See Comments)    GI upset, rhabdomyalosis   . Aspirin Diarrhea and Other (See Comments)    Only ASA 325mg  ; pt can tolerate low dose asa 81mg  Reaction : GI upset   . Hctz [Hydrochlorothiazide] Rash  . Hydralazine Other (See Comments)    Unknown reaction - listed on [facility] 06/08/2015  . Reserpine Other (See Comments)    Unknown reaction - listed on [facility] 06/08/2015  . Tramadol Other (See Comments)    Unknown reaction - listed on [facility] 06/08/2015    Patient Measurements: Height: 4\' 9"  (144.8 cm) Weight: 95 lb 14.4 oz (43.5 kg) IBW/kg (Calculated) : 38.6 Heparin Dosing Weight:   Vital Signs: Temp: 98.2 F (36.8 C) (01/05 0512) Temp Source: Axillary (01/05 0512) BP: 108/76 mmHg (01/05 0512) Pulse Rate: 88 (01/05 0512)  Labs:  Recent Labs  08/18/15 1347  08/19/15 1018 08/19/15 1442 08/19/15 1555 08/20/15 0505 08/20/15 2040 08/21/15 0656  HGB 11.9*  --   --   --  11.4* 9.2*  --  8.7*  HCT 34.5*  --   --   --  31.4* 26.4*  --  24.3*  PLT 156  --   --   --  124* 103*  --  125*  APTT  --   < >  --  104*  --  42* 53*  --   LABPROT 19.2*  --   --   --   --   --   --  16.3*  INR 1.61*  --   --   --   --   --   --  1.29  HEPARINUNFRC  --   < >  --  1.04*  --  0.57 0.51  --   CREATININE 1.21*  --  1.24*  --   --  1.09*  --  1.03*  < > = values in this interval not displayed.  Estimated Creatinine Clearance: 27.9 mL/min (by C-G formula based on Cr of 1.03).   Medications:  Apixaban 2.5mg  BID, LD 1/2 @ 0700  Assessment: 70 yoF with PMHx seizures, stroke, PE on chronic anticoagulation with Apixaban (risk reduction dose), and dementia brought to ED after fall, found to have closed right hip fracture and multiple pubic rami fractures.  Pharmacy consulted to  bridge patient with IV heparin in anticipation of surgery in 48-72 hours.   Baseline aPTT = 29, WNL Baseline HL = 1.66, high as expected due to effects of apixaban (LD 1/2 7am)  Today's  Labs, 08/21/2015   No heparin monitoring labs this am as turned off gtt at 8am  CBC: Hgb down/relatively stable, pltc better  1/4 hs - RN reported bleeding from foley  Renal function decreased  *Noted previous heparin level was supratherapeutic on heparin infusion of 700 units/hr (06/09/15)  Goal of Therapy:  aPTT 66-102 Heparin level 0.3-0.7 units/ml Monitor platelets by anticoagulation protocol: Yes   Plan:   Heparin gtt turned off at 8am for OR at 16:00  F/u post-op plans re: resuming anticoagulation (resume eliquis vs heparin gtt)  Doreene Eland, PharmD, BCPS.   Pager: [phone] 08/21/2015 8:47 AM

## 2015-08-21 NOTE — Progress Notes (Signed)
[facility] PROGRESS NOTE  Tanya White [id number] DOB: 18-Aug-1935 DOA: 08/18/2015 PCP: [name], MD  Brief interval history  80 year old African-American female with past medical history of seizures, stroke, pulmonary embolism on anticoagulation, dementia, presented after a mechanical fall. This resulted in pubic rami fracture as well as fracture of the right hip. She was hospitalized for further management. Patient to the operating room today 08/21/2015. Patient also noted to have a Proteus mirabilis UTI.   Assessment/Plan: #1 right comminuted intertrochanteric femur fracture Secondary to mechanical fall. Eliquis on hold. Patient has been assessed by orthopedics and plan is for surgery today 08/21/2015. Pain management. Per family patient with issues with Ultram and Percocet which may have decreased seizure threshold in the past. Continue Tylenol as needed. Per orthopedics.  #2 Proteus mirabilis UTI Patient with complaints of dysuria and some suprapubic discomfort. Per family patient was on trimethoprim for prophylaxis. Urine culture sensitivities with resistance to trimethoprim. Change IV Rocephin to IV ampicillin..  #3 pulmonary embolus Patient was on eliquis, which has been held in anticipation of surgery. Currently on IV heparin which was held this morning. Orthopedics to advise when anticoagulation may be resumed.  #4 history of gastrostomy tube Patient used to have a PEG tube after her stroke however no longer present.  #5 history of seizures Continue Vimpat and Depakote. Discontinued when necessary Ativan for now as patient was given a dose of Ativan early yesterday morning and had some confusion per nursing. Confusion improved and patient currently at baseline per family.  #6 history of stroke with residual deficits PT/OT postop.  #7 acute renal failure Likely secondary to prerenal azotemia. Renal function improving. Follow.  #8 anemia Anemia panel  consistent with anemia of chronic disease and iron deficiency.   #9 prophylaxis Heparin for DVT prophylaxis.  Code Status: Full Family Communication: Updated daughter and family at bedside.  Disposition Plan: Likely skilled nursing facility once hip has been repaired.   Consultants:  Orthopedics: Dr. Lyla Glassing 08/18/2015  Procedures:  CT pelvis 08/18/2015  Chest x-ray 08/18/2015  Plain films of the right hip and pelvis 08/18/2015  Plain films of the right femur 08/18/2015  Antibiotics:  IV Rocephin 08/20/2015>>.Marland Kitchen 08/21/2015  IV ampicillin 08/21/2015  HPI/Subjective: Patient asking for food. Patient with complaints of dysuria prior to hospitalization and during hospitalization. Patient also with complaints of some suprapubic discomfort.  Objective: Filed Vitals:   08/20/15 2103 08/21/15 0512  BP: 135/65 108/76  Pulse: 102 88  Temp: 99.4 F (37.4 C) 98.2 F (36.8 C)  Resp: 19 16    Intake/Output Summary (Last 24 hours) at 08/21/15 1222 Last data filed at 08/21/15 1000  Gross per 24 hour  Intake   1050 ml  Output    725 ml  Net    325 ml   Filed Weights   08/18/15 1800  Weight: 43.5 kg (95 lb 14.4 oz)    Exam:   General:  NAD  Cardiovascular: RRR  Respiratory: CTAB  Abdomen: Soft/NT/ND/+BS  Musculoskeletal: No c/c/e  Data Reviewed: Basic Metabolic Panel:  Recent Labs Lab 08/18/15 1347 08/19/15 1018 08/20/15 0505 08/21/15 0656  NA 139 139 139 140  K 4.4 4.0 4.6 4.7  CL 103 105 107 108  CO2 27 24 26 24   GLUCOSE 148* 157* 119* 113*  BUN 26* 21* 21* 23*  CREATININE 1.21* 1.24* 1.09* 1.03*  CALCIUM 8.9 8.9 8.4* 8.4*   Liver Function Tests:  Recent Labs Lab 08/18/15 1347  AST 25  ALT 14  ALKPHOS  51  BILITOT 0.6  PROT 7.2  ALBUMIN 4.0   No results for input(s): LIPASE, AMYLASE in the last 168 hours. No results for input(s): AMMONIA in the last 168 hours. CBC:  Recent Labs Lab 08/18/15 1347 08/19/15 1555 08/20/15 0505  08/21/15 0656  WBC 9.1 10.9* 9.9 8.0  NEUTROABS 7.2  --   --   --   HGB 11.9* 11.4* 9.2* 8.7*  HCT 34.5* 31.4* 26.4* 24.3*  MCV 86.9 82.6 85.2 86.5  PLT 156 124* 103* 125*   Cardiac Enzymes: No results for input(s): CKTOTAL, CKMB, CKMBINDEX, TROPONINI in the last 168 hours. BNP (last 3 results) No results for input(s): BNP in the last 8760 hours.  ProBNP (last 3 results) No results for input(s): PROBNP in the last 8760 hours.  CBG: No results for input(s): GLUCAP in the last 168 hours.  Recent Results (from the past 240 hour(s))  Urine culture     Status: None   Collection Time: 08/18/15  5:33 PM  Result Value Ref Range Status   Specimen Description URINE, RANDOM  Final   Special Requests NONE  Final   Culture   Final    >=100,000 COLONIES/mL PROTEUS MIRABILIS Performed at [facility]    Report Status 08/20/2015 FINAL  Final   Organism ID, Bacteria PROTEUS MIRABILIS  Final      Susceptibility   Proteus mirabilis - MIC*    AMPICILLIN <=2 SENSITIVE Sensitive     CEFAZOLIN <=4 SENSITIVE Sensitive     CEFTRIAXONE <=1 SENSITIVE Sensitive     CIPROFLOXACIN >=4 RESISTANT Resistant     GENTAMICIN 8 INTERMEDIATE Intermediate     IMIPENEM 1 SENSITIVE Sensitive     NITROFURANTOIN 128 RESISTANT Resistant     TRIMETH/SULFA >=320 RESISTANT Resistant     AMPICILLIN/SULBACTAM <=2 SENSITIVE Sensitive     PIP/TAZO <=4 SENSITIVE Sensitive     * >=100,000 COLONIES/mL PROTEUS MIRABILIS  Surgical pcr screen     Status: Abnormal   Collection Time: 08/19/15  6:41 AM  Result Value Ref Range Status   MRSA, PCR POSITIVE (A) NEGATIVE Final    Comment: RESULT CALLED TO, READ BACK BY AND VERIFIED WITH: TARTELOW RN AT [id number] ON 1.3.17 BY SHUEA    Staphylococcus aureus POSITIVE (A) NEGATIVE Final    Comment:        The Xpert SA Assay (FDA approved for NASAL specimens in patients over 32 years of age), is one component of a comprehensive surveillance program.  Test performance  has been validated by [facility] for patients greater than or equal to 82 year old. It is not intended to diagnose infection nor to guide or monitor treatment.      Studies: No results found.  Scheduled Meds: . ampicillin (OMNIPEN) IV  500 mg Intravenous 3 times per day  . Chlorhexidine Gluconate Cloth  6 each Topical Q0600  . divalproex  250 mg Oral BID  . famotidine  20 mg Oral Daily  . feeding supplement  1 Container Oral TID BM  . ferrous sulfate  325 mg Oral Daily  . lacosamide  200 mg Oral BID  . magnesium oxide  400 mg Oral Daily  . metoprolol tartrate  37.5 mg Oral BID  . mupirocin ointment  1 application Nasal BID  . polyethylene glycol  17 g Oral Daily  . vancomycin  1,000 mg Intravenous To OR   Continuous Infusions: . sodium chloride 50 mL/hr at 08/20/15 2042    Principal Problem:  Closed right hip fracture (HCC) Active Problems:   Pulmonary embolism ([place])   Gastrostomy tube in place ([place])   Seizures ([place])   H/O: stroke with residual effects   AKI (acute kidney injury) ([place])   Fracture of multiple pubic rami ([place])   Aortic atherosclerosis ([place])   Fall   Urinary tract infection due to Proteus   Pelvic ring fracture ([place])    Time spent: 35 mins    [facility] MD [facility] Pager [phone]. If 7PM-7AM, please contact night-coverage at [email or web address], password [facility] 08/21/2015, 12:22 PM  LOS: 3 days

## 2015-08-21 NOTE — Op Note (Signed)
NAMEGLENISE, FEAST NO.:  [id number]  MEDICAL RECORD NO.:  [id number]  LOCATION:  18                         FACILITY:  [facility]  PHYSICIAN:  Rod Can, MD     DATE OF BIRTH:  1936-01-02  DATE OF PROCEDURE:  08/21/2015 DATE OF DISCHARGE:                              OPERATIVE REPORT   SURGEON:  Rod Can, MD.  ASSISTANT:  Staff.  PREOPERATIVE DIAGNOSIS:  Comminuted right pertrochanteric femur fracture.  POSTOPERATIVE DIAGNOSIS:  Comminuted right pertrochanteric femur fracture.  PROCEDURE PERFORMED:  Intramedullary fixation of right pertrochanteric femur fracture.  IMPLANTS: 1. Synthes TFNA nail 11 x 170 mm, 130 degrees. 2. TFNA screw 80 mm. 3. A 5 mm distal interlocking screw x1.  ANESTHESIA:  General.  ANTIBIOTICS:  1 g of vancomycin.  The patient was already receiving scheduled IV ampicillin for proteus UTI.  COMPLICATIONS:  None.  DISPOSITION:  Stable to PACU.  ESTIMATED BLOOD LOSS:  150 mL.  INDICATIONS:  The patient is a 80 year old female with multiple medical problems including previous stroke and right-sided weakness who sustained a ground level fall at [facility] on August 18, 2015. She had hip pain and inability to weight bear.  She was brought to the emergency department where x-rays revealed a right LC-1 pelvic fracture as well as a comminuted right pertrochanteric femur fracture.  She was admitted to the hospitalist service.  She takes chronic Eliquis for atrial fibrillation.  Therefore, her on Eliquis was stopped.  We waited 72 hours and she was on a heparin drip in the meantime.  Risks, benefits, and alternatives to surgical fixation were explained to her family and they elected to proceed.  DESCRIPTION OF PROCEDURE IN DETAIL:  I identified the patient in the holding area.  I marked the surgical site.  She received her IV vancomycin in the holding area.  She was taken to the operating room, placed general  anesthesia was induced on her bed.  She was then transferred to the [facility] table.  The left lower extremity was scissored underneath the right.  I reduced the fracture with standard traction, internal rotation and adduction.  Her fracture was very comminuted.  The tip of greater trochanter was in several pieces.  The fracture did not extend into the subtrochanteric region of the femur.  I began by making a standard longitudinal incision on proximal to the tip of the trochanter.  It was clear that on the lateral x-ray she had an apex anterior deformity.  We used a mallet to apply a posterior force over the fracture site and I determined the starting point using an awl.  I placed a guide pin and then used the entry reamer.  The real nail was impacted into place and it appeared that there was an acceptable reduction.  Through a separate stab incision, I placed a cannula down to the bone for the cephalomedullary device.  I placed a guide pin.  I measured, reamed, and placed the real 80 mm screw.  I then placed a distal interlocking screw.  I removed the jig.  I was obtaining final AP and lateral radiographs.  On the AP x-ray, she had good tip apex distance.  Her neck shaft angle was appropriate.  There was no chondral penetration.  On the lateral x-ray, her cephalomedullary screw appeared to not be contained in the posterior aspect of her femoral neck. It was in the center of the femoral head. She had persistent apex anterior  deformity due to the severe comminuted nature of her fracture.  I decided  that I did not want to leave her fracture in this alignment, therefore I  reattached the jig percutaneously and I tightened the bolt.  I loosened the  set screw, I removed the distal screw.  I then removed the cephalomedullary  device as well as the nail.  I enlarged the incision for the cephalomedullary device.  I reflected the vastus anterior, I placed a bone hook anteriorly over the femur to  apply a direct downward force to correct the apex anterior deformity.  I used a rigid starting reamer to translate our starting point slightly anterior and then I replaced the nail.  I maintained the reduction with the bone hook.  I then applied the guide pin for the cephalomedullary device using AP and lateral fluoroscopy.  I then measured, reamed, and placed 80 mm screw.  Overall, the alignment of the fracture was markedly improved.  She did have some residual apex anterior deformity, but this was much improved over prior. I deemed that this was an acceptable reduction and fixation due to her minimal ambulatory status and previous stroke.  I then removed the jig after tightening the set screw.  I copiously irrigated the wound with saline.  I closed the wound in layers with #1 Vicryl for the fascia, 2-0 Monocryl for the deep dermal layer, running 3-0 Monocryl subcuticular stitch.  Glue was applied to the skin.  Once the glue was hardened, sterile Mepilex dressings were applied.  The patient was then extubated, taken to PACU in stable condition.  Also sponge, needle, and instrument counts were correct at the end of the case x2.  I discussed operative events and findings with the patient's family. Plan will be to keep her touchdown weightbearing, and therefore she will mostly be bed-to-chair transfers.  We will resume her Eliquis tomorrow morning.  She will work with physical therapy and had disposition planning.  I will plan to see her back in the office 2 weeks after discharge.  All questions solicited and answered.          ______________________________ Rod Can, MD     BS/MEDQ  D:  08/21/2015  T:  08/21/2015  Job:  [id number]

## 2015-08-22 ENCOUNTER — Encounter (HOSPITAL_COMMUNITY): Payer: Self-pay | Admitting: Orthopedic Surgery

## 2015-08-22 DIAGNOSIS — R569 Unspecified convulsions: Secondary | ICD-10-CM

## 2015-08-22 DIAGNOSIS — N390 Urinary tract infection, site not specified: Secondary | ICD-10-CM

## 2015-08-22 DIAGNOSIS — B964 Proteus (mirabilis) (morganii) as the cause of diseases classified elsewhere: Secondary | ICD-10-CM

## 2015-08-22 LAB — CBC
HEMATOCRIT: 24.7 % — AB (ref 36.0–46.0)
Hemoglobin: 8.7 g/dL — ABNORMAL LOW (ref 12.0–15.0)
MCH: 30.7 pg (ref 26.0–34.0)
MCHC: 35.2 g/dL (ref 30.0–36.0)
MCV: 87.3 fL (ref 78.0–100.0)
Platelets: 104 10*3/uL — ABNORMAL LOW (ref 150–400)
RBC: 2.83 MIL/uL — ABNORMAL LOW (ref 3.87–5.11)
RDW: 14.4 % (ref 11.5–15.5)
WBC: 8.3 10*3/uL (ref 4.0–10.5)

## 2015-08-22 LAB — TYPE AND SCREEN
ABO/RH(D): B POS
ANTIBODY SCREEN: NEGATIVE
UNIT DIVISION: 0
Unit division: 0

## 2015-08-22 LAB — BASIC METABOLIC PANEL
Anion gap: 7 (ref 5–15)
BUN: 20 mg/dL (ref 6–20)
CALCIUM: 8.5 mg/dL — AB (ref 8.9–10.3)
CO2: 25 mmol/L (ref 22–32)
Chloride: 110 mmol/L (ref 101–111)
Creatinine, Ser: 0.8 mg/dL (ref 0.44–1.00)
GFR calc Af Amer: >60 mL/min (ref >60)
GLUCOSE: 114 mg/dL — AB (ref 65–99)
Potassium: 4.7 mmol/L (ref 3.5–5.1)
Sodium: 142 mmol/L (ref 135–145)

## 2015-08-22 NOTE — Progress Notes (Signed)
CSW assisting with d/c planning. [facility] ( [facility] ) contacted and is able to admit pt on Sat / Sun. SNF will need D/C Summary by 10 am on Sat / Sun. Week end CSW will assist with d/c planning.   Werner Lean LCSW [phone]

## 2015-08-22 NOTE — Progress Notes (Signed)
[facility] PROGRESS NOTE  Tanya White [id number] DOB: 1936-07-18 DOA: 08/18/2015 PCP: [name], MD  Brief interval history  80 year old African-American female with past medical history of seizures, stroke, pulmonary embolism on anticoagulation, dementia, presented after a mechanical fall. This resulted in pubic rami fracture as well as fracture of the right hip. She was hospitalized for further management. S/P intramedullary fixation of right peritrochanteric femur fracture by orthopedics on 1/5.    Assessment/Plan: #1 right comminuted intertrochanteric femur fracture Secondary to mechanical fall.  - Patient is status post intramedullary fixation of right peritrochanteric femur fracture by orthopedics on 1/5. - Foley catheter to be removed 1/6. Resuming Eliquis 1/6 - Possible SNF over weekend.  #2 Proteus mirabilis UTI Patient with complaints of dysuria and some suprapubic discomfort. Per family patient was on trimethoprim for prophylaxis. Urine culture sensitivities with resistance to trimethoprim. Change IV Rocephin to IV ampicillin.. DC on oral antibiotics.  #3 pulmonary embolus Patient was on eliquis, which was held in anticipation of surgery - resumed.   #4 history of gastrostomy tube Patient used to have a PEG tube after her stroke however no longer present.  #5 history of seizures Continue Vimpat and Depakote. Discontinued when necessary Ativan for now as patient was given a dose of Ativan early 1/4 morning and had some confusion per nursing. Confusion improved and patient currently at baseline per family.  #6 history of stroke with residual deficits PT/OT postop.  #7 acute renal failure Likely secondary to prerenal azotemia. resolved  #8 anemia Anemia panel consistent with anemia of chronic disease and iron deficiency. Stable  Thrombocytopenia - Stable. Follow CBCs in a.m.  DVT prophylaxis. Eliquis full dose Code Status: Full Family Communication:  None at bedside.  Disposition Plan: Likely skilled nursing facility once hip has been repaired - possibly on weekend.   Consultants:  Orthopedics: Dr. Lyla Glassing 08/18/2015  Procedures:  CT pelvis 08/18/2015  Chest x-ray 08/18/2015  Plain films of the right hip and pelvis 08/18/2015  Plain films of the right femur 08/18/2015  S/P intramedullary fixation of right peritrochanteric femur fracture by orthopedics on 1/5.    Antibiotics:  IV Rocephin 08/20/2015>>.Marland Kitchen 08/21/2015  IV ampicillin 08/21/2015  HPI/Subjective: Denies complaints. Mild appropriate pain at surgery site.  Objective:  Filed Vitals:   08/22/15 0216 08/22/15 0500 08/22/15 1047 08/22/15 1400  BP: 112/85 150/67 123/44 139/49  Pulse: 97 109 110 75  Temp: 100 F (37.8 C) 100 F (37.8 C) 98.2 F (36.8 C) 98.5 F (36.9 C)  TempSrc: Axillary Axillary Oral Oral  Resp: 16  16 16   Height:      Weight:      SpO2: 100% 100% 91% 96%     Intake/Output Summary (Last 24 hours) at 08/22/15 1835 Last data filed at 08/22/15 1545  Gross per 24 hour  Intake 3010.17 ml  Output    775 ml  Net 2235.17 ml   Filed Weights   08/18/15 1800  Weight: 43.5 kg (95 lb 14.4 oz)    Exam:   General: Pleasant elderly female lying comfortably propped up in bed.  Cardiovascular: RRR  Respiratory: CTAB  Abdomen: Soft/NT/ND/+BS  Musculoskeletal: No c/c/e  CNS: Alert and oriented to person and partly to place.  Extremities: Right hip fracture surgical site dressing clean and dry.  Data Reviewed: Basic Metabolic Panel:  Recent Labs Lab 08/18/15 1347 08/19/15 1018 08/20/15 0505 08/21/15 0656 08/22/15 0459  NA 139 139 139 140 142  K 4.4 4.0 4.6 4.7 4.7  CL 103  105 107 108 110  CO2 27 24 26 24 25   GLUCOSE 148* 157* 119* 113* 114*  BUN 26* 21* 21* 23* 20  CREATININE 1.21* 1.24* 1.09* 1.03* 0.80  CALCIUM 8.9 8.9 8.4* 8.4* 8.5*   Liver Function Tests:  Recent Labs Lab 08/18/15 1347  AST 25  ALT 14  ALKPHOS  51  BILITOT 0.6  PROT 7.2  ALBUMIN 4.0   No results for input(s): LIPASE, AMYLASE in the last 168 hours. No results for input(s): AMMONIA in the last 168 hours. CBC:  Recent Labs Lab 08/18/15 1347 08/19/15 1555 08/20/15 0505 08/21/15 0656 08/21/15 2105 08/22/15 0459  WBC 9.1 10.9* 9.9 8.0 10.9* 8.3  NEUTROABS 7.2  --   --   --   --   --   HGB 11.9* 11.4* 9.2* 8.7* 7.5* 8.7*  HCT 34.5* 31.4* 26.4* 24.3* 21.6* 24.7*  MCV 86.9 82.6 85.2 86.5 86.1 87.3  PLT 156 124* 103* 125* 102* 104*   Cardiac Enzymes: No results for input(s): CKTOTAL, CKMB, CKMBINDEX, TROPONINI in the last 168 hours. BNP (last 3 results) No results for input(s): BNP in the last 8760 hours.  ProBNP (last 3 results) No results for input(s): PROBNP in the last 8760 hours.  CBG: No results for input(s): GLUCAP in the last 168 hours.  Recent Results (from the past 240 hour(s))  Urine culture     Status: None   Collection Time: 08/18/15  5:33 PM  Result Value Ref Range Status   Specimen Description URINE, RANDOM  Final   Special Requests NONE  Final   Culture   Final    >=100,000 COLONIES/mL PROTEUS MIRABILIS Performed at [facility]    Report Status 08/20/2015 FINAL  Final   Organism ID, Bacteria PROTEUS MIRABILIS  Final      Susceptibility   Proteus mirabilis - MIC*    AMPICILLIN <=2 SENSITIVE Sensitive     CEFAZOLIN <=4 SENSITIVE Sensitive     CEFTRIAXONE <=1 SENSITIVE Sensitive     CIPROFLOXACIN >=4 RESISTANT Resistant     GENTAMICIN 8 INTERMEDIATE Intermediate     IMIPENEM 1 SENSITIVE Sensitive     NITROFURANTOIN 128 RESISTANT Resistant     TRIMETH/SULFA >=320 RESISTANT Resistant     AMPICILLIN/SULBACTAM <=2 SENSITIVE Sensitive     PIP/TAZO <=4 SENSITIVE Sensitive     * >=100,000 COLONIES/mL PROTEUS MIRABILIS  Surgical pcr screen     Status: Abnormal   Collection Time: 08/19/15  6:41 AM  Result Value Ref Range Status   MRSA, PCR POSITIVE (A) NEGATIVE Final    Comment: RESULT  CALLED TO, READ BACK BY AND VERIFIED WITH: TARTELOW RN AT [id number] ON 1.3.17 BY SHUEA    Staphylococcus aureus POSITIVE (A) NEGATIVE Final    Comment:        The Xpert SA Assay (FDA approved for NASAL specimens in patients over 15 years of age), is one component of a comprehensive surveillance program.  Test performance has been validated by [facility] for patients greater than or equal to 13 year old. It is not intended to diagnose infection nor to guide or monitor treatment.      Studies: Dg C-arm 1-60 Min-no Report  08/21/2015  CLINICAL DATA: hip C-ARM 1-60 MINUTES Fluoroscopy was utilized by the requesting physician.  No radiographic interpretation.   Dg Hip Unilat With Pelvis 1v Right  08/21/2015  CLINICAL DATA:  Postoperative ORIF right hip fracture. EXAM: DG HIP (WITH OR WITHOUT PELVIS) 1V RIGHT COMPARISON:  Intraoperative  fluoroscopic films August 21, 2015 FINDINGS: Post ORIF of right femur fracture without malalignment. Intra medullary rod fixation with screw is identified unchanged. Soft tissue air and edema is identified in the right hip. There is fracture of the right superior and inferior pubic rami. IMPRESSION: Post ORIF of right femoral fracture without malalignment. Electronically Signed   By: Abelardo Diesel M.D.   On: 08/21/2015 20:46   Dg Hip Operative Unilat With Pelvis Right  08/21/2015  CLINICAL DATA:  Right hip fracture, post ORIF EXAM: DG C-ARM 1-60 MIN-NO REPORT; OPERATIVE RIGHT HIP WITH PELVIS FLUOROSCOPY TIME:  5 minutes, 44 seconds (32.6 mGy) COMPARISON:  Right hip radiographs - 08/18/2015 FINDINGS: Two intraoperative fluoroscopic images of the right hip are provided for review Images demonstrate the sequela of intra medullary rod fixation of the proximal femur and dynamic screw fixation of the right femoral neck traversing the previously identified comminuted intertrochanteric femur fracture. Alignment appears near anatomic. There is a minimal amount of expected  adjacent subcutaneous emphysema. No radiopaque foreign body. IMPRESSION: Post ORIF of right femur fracture without evidence of complication. Electronically Signed   By: Sandi Mariscal M.D.   On: 08/21/2015 19:44    Scheduled Meds: . ampicillin (OMNIPEN) IV  500 mg Intravenous 3 times per day  . apixaban  2.5 mg Oral BID  . divalproex  250 mg Oral BID  . docusate sodium  100 mg Oral BID  . famotidine  20 mg Oral Daily  . feeding supplement  1 Container Oral TID BM  . ferrous sulfate  325 mg Oral Daily  . lacosamide  200 mg Oral BID  . magnesium oxide  400 mg Oral Daily  . metoprolol tartrate  37.5 mg Oral BID  . mupirocin ointment  1 application Nasal BID  . polyethylene glycol  17 g Oral Daily  . senna  1 tablet Oral BID   Continuous Infusions: . sodium chloride 50 mL/hr at 08/20/15 2042  . sodium chloride 50 mL/hr (08/22/15 0209)    Principal Problem:   Closed right hip fracture (HCC) Active Problems:   Pulmonary embolism (HCC)   Gastrostomy tube in place ([place])   Seizures ([place])   H/O: stroke with residual effects   AKI (acute kidney injury) ([place])   Fracture of multiple pubic rami (HCC)   Aortic atherosclerosis (HCC)   Fall   Urinary tract infection due to Proteus   Pelvic ring fracture (HCC)   Fracture, intertrochanteric, right femur ([place])    Time spent: 25 mins   HONGALGI,ANAND, MD, FACP, FHM. [facility] Pager [phone]  If 7PM-7AM, please contact night-coverage [email or web address] Password [email or web address] 08/22/2015, 6:42 PM    LOS: 4 days

## 2015-08-22 NOTE — Care Management Note (Signed)
Case Management Note  Patient Details  Name: Gladyse Duree MRN: [id number] Date of Birth: 05/19/1936  Subjective/Objective:    S/p Intramedullary fixation of right pertrochanteric femur fracture.                Action/Plan: Discharge planning per CSW  Expected Discharge Date:   (unknown)               Expected Discharge Plan:  [place]  In-House Referral:  Clinical Social Work  Discharge planning Services  CM Consult  Post Acute Care Choice:  NA Choice offered to:  NA  DME Arranged:  N/A DME Agency:  NA  HH Arranged:  NA HH Agency:  NA  Status of Service:  Completed, signed off  Medicare Important Message Given:  Yes Date Medicare IM Given:    Medicare IM give by:    Date Additional Medicare IM Given:    Additional Medicare Important Message give by:     If discussed at [place] of Stay Meetings, dates discussed:    Additional Comments:  Guadalupe Maple, RN 08/22/2015, 11:11 AM

## 2015-08-22 NOTE — Progress Notes (Signed)
Physical Therapy Treatment Patient Details Name: Tanya White MRN: [id number] DOB: 12-Jun-1936 Today's Date: 08/22/2015    History of Present Illness 80 year old African-American female with past medical history of seizures, stroke, pulmonary embolism on anticoagulation, dementia, presented after a mechanical fall. This resulted in pubic rami fracture as well as fracture of the right hip, s/p R hip IM nail  on 08/20/14 per Dr. Lyla Glassing    PT Comments    Pt continues cooperative but limited by PWB status and cognitive deficits  Follow Up Recommendations  SNF;Supervision/Assistance - 24 hour     Equipment Recommendations  None recommended by PT    Recommendations for Other Services       Precautions / Restrictions Precautions Precautions: Fall Restrictions Weight Bearing Restrictions: No RLE Weight Bearing: Partial weight bearing RLE Partial Weight Bearing Percentage or Pounds: 30%--->progress notes state "TDWB" orders state 30% "PWB"    Mobility  Bed Mobility Overal bed mobility: Needs Assistance;+2 for physical assistance Bed Mobility: Sit to Supine       Sit to supine: Mod assist;+2 for physical assistance;+2 for safety/equipment   General bed mobility comments: assist fo rtrunk and LEs, cues for participation and technique  Transfers Overall transfer level: Needs assistance Equipment used: None Transfers: Sit to/from Stand Sit to Stand: Mod assist;+2 safety/equipment         General transfer comment: With mod assist, pt stood and maintained standing for change in sacral dressing.  Pt stand pvt to bedside  Ambulation/Gait                 Stairs            Wheelchair Mobility    Modified Rankin (Stroke Patients Only)       Balance                                    Cognition Arousal/Alertness: Awake/alert Behavior During Therapy: WFL for tasks assessed/performed Overall Cognitive Status: History of cognitive impairments -  at baseline Area of Impairment: Problem solving;Following commands       Following Commands: Follows one step commands inconsistently     Problem Solving: Slow processing;Difficulty sequencing;Requires verbal cues;Requires tactile cues      Exercises      General Comments        Pertinent Vitals/Pain Pain Assessment: Faces Faces Pain Scale: Hurts a little bit Pain Location: R hip Pain Descriptors / Indicators: Guarding Pain Intervention(s): Limited activity within patient's tolerance;Monitored during session    Home Living                      Prior Function            PT Goals (current goals can now be found in the care plan section) Acute Rehab PT Goals Patient Stated Goal: pt wants to "go home" PT Goal Formulation: Patient unable to participate in goal setting Time For Goal Achievement: 08/29/15 Potential to Achieve Goals: Fair Progress towards PT goals: Progressing toward goals    Frequency  Min 3X/week    PT Plan Current plan remains appropriate    Co-evaluation             End of Session Equipment Utilized During Treatment: Gait belt Activity Tolerance: Patient tolerated treatment well Patient left: in bed;with call bell/phone within reach;with family/visitor present;with nursing/sitter in room     Time: 1420-1430 PT Time Calculation (min) (ACUTE  ONLY): 10 min  Charges:  $Therapeutic Activity: 8-22 mins                    G Codes:      Britta Louth Sep 10, 2015, 4:25 PM

## 2015-08-22 NOTE — Evaluation (Signed)
Physical Therapy Evaluation Patient Details Name: Tanya White MRN: [id number] DOB: 12-22-1935 Today's Date: 08/22/2015   History of Present Illness  80 year old African-American female with past medical history of seizures, stroke, pulmonary embolism on anticoagulation, dementia, presented after a mechanical fall. This resulted in pubic rami fracture as well as fracture of the right hip, s/p R hip IM nail  on 08/20/14 per Dr. Lyla Glassing  Clinical Impression  Pt admitted with above diagnosis. Pt currently with functional limitations due to the deficits listed below (see PT Problem List).  Pt will benefit from skilled PT to increase their independence and safety with mobility to allow discharge to the venue listed below.  Pt is limited by baseline dementia and now 30% WBing on RLE; will continue to follow; recommend return to SNF--pt was at [facility] in [place]     Follow Up Recommendations SNF;Supervision/Assistance - 24 hour    Equipment Recommendations  None recommended by PT    Recommendations for Other Services       Precautions / Restrictions Precautions Precautions: Fall Restrictions RLE Weight Bearing: Partial weight bearing RLE Partial Weight Bearing Percentage or Pounds: 30%--->progress notes state "TDWB" orders state 30% "PWB"      Mobility  Bed Mobility Overal bed mobility: Needs Assistance;+2 for physical assistance Bed Mobility: Supine to Sit     Supine to sit: Mod assist;+2 for physical assistance     General bed mobility comments: assist fo rtrunk and LEs, cues for participation and technique  Transfers Overall transfer level: Needs assistance Equipment used: Rolling walker (2 wheeled) Transfers: Sit to/from [facility] Sit to Stand: +2 physical assistance;Mod assist Stand pivot transfers: +2 physical assistance;Mod assist       General transfer comment: multi-modal cues and assist for 30% WBing on RLE, sequence and safety; +2 for  balance, safety and to pivot to chair  Ambulation/Gait                Stairs            Wheelchair Mobility    Modified Rankin (Stroke Patients Only)       Balance                                             Pertinent Vitals/Pain Pain Assessment: Faces Faces Pain Scale: Hurts a little bit Pain Location: R hip Pain Descriptors / Indicators: Guarding Pain Intervention(s): Limited activity within patient's tolerance;Monitored during session    [place] expects to be discharged to:: Skilled nursing facility                      Prior Function           Comments: pt brother present end of PT session--he reports pt was able to "walk some" but mostly used w/c; pt has been a resident at [facility] in [facility] for ~16yr     Hand Dominance        Extremity/Trunk Assessment   Upper Extremity Assessment: Overall WFL for tasks assessed           Lower Extremity Assessment: RLE deficits/detail;Difficult to assess due to impaired cognition RLE Deficits / Details: hip 2/5; knee 2/5; difficult to fully assess d/t cognition       Communication      Cognition Arousal/Alertness: Awake/alert Behavior During Therapy: [facility] for tasks assessed/performed Overall Cognitive  Status: History of cognitive impairments - at baseline Area of Impairment: Problem solving;Following commands       Following Commands: Follows one step commands inconsistently     Problem Solving: Slow processing;Difficulty sequencing;Requires verbal cues;Requires tactile cues      General Comments      Exercises        Assessment/Plan    PT Assessment Patient needs continued PT services  PT Diagnosis Generalized weakness;Difficulty walking   PT Problem List Decreased strength;Decreased activity tolerance;Decreased balance;Decreased mobility;Decreased knowledge of use of DME;Decreased safety awareness;Pain  PT Treatment Interventions DME  instruction;Gait training;Functional mobility training;Therapeutic activities;Therapeutic exercise   PT Goals (Current goals can be found in the Care Plan section) Acute Rehab PT Goals Patient Stated Goal: pt wants to "go home" PT Goal Formulation: Patient unable to participate in goal setting Time For Goal Achievement: 08/29/15 Potential to Achieve Goals: Fair    Frequency Min 3X/week   Barriers to discharge        Co-evaluation               End of Session Equipment Utilized During Treatment: Gait belt Activity Tolerance: Patient tolerated treatment well;Other (comment) (limited by cognition) Patient left: in chair;with call bell/phone within reach;with chair alarm set Nurse Communication: Mobility status         Time: [id number] PT Time Calculation (min) (ACUTE ONLY): 29 min   Charges:   PT Evaluation $PT Eval Moderate Complexity: 1 Procedure PT Treatments $Therapeutic Activity: 8-22 mins   PT G Codes:        Wiktoria Hemrick Aug 28, 2015, 11:41 AM

## 2015-08-22 NOTE — Progress Notes (Signed)
OT Note  Patient Details Name: Tanya White MRN: [id number] DOB: September 07, 1935   Cancelled Treatment:    Noted plans for SNF- will Defer OT eval to SNF to address ADL activity. Mickel Baas Od[place], [place] 08/22/2015, 11:02 AM

## 2015-08-22 NOTE — Progress Notes (Signed)
   Subjective:  Patient reports pain as mild to moderate.  No c/o.  Objective:   VITALS:   Filed Vitals:   08/21/15 2356 08/22/15 0028 08/22/15 0216 08/22/15 0500  BP: 143/63 152/80 112/85 150/67  Pulse: 99 105 97 109  Temp: 97.9 F (36.6 C) 98.4 F (36.9 C) 100 F (37.8 C) 100 F (37.8 C)  TempSrc: Oral Axillary Axillary Axillary  Resp: 16 16 16    Height:      Weight:      SpO2: 100% 100% 100% 100%    ABD soft Sensation intact distally Intact pulses distally Dorsiflexion/Plantar flexion intact Incision: dressing C/D/I Compartment soft   Lab Results  Component Value Date   WBC 8.3 08/22/2015   HGB 8.7* 08/22/2015   HCT 24.7* 08/22/2015   MCV 87.3 08/22/2015   PLT 104* 08/22/2015   BMET    Component Value Date/Time   NA 142 08/22/2015 0459   K 4.7 08/22/2015 0459   CL 110 08/22/2015 0459   CO2 25 08/22/2015 0459   GLUCOSE 114* 08/22/2015 0459   BUN 20 08/22/2015 0459   CREATININE 0.80 08/22/2015 0459   CALCIUM 8.5* 08/22/2015 0459   GFRNONAA >60 08/22/2015 0459   GFRAA >60 08/22/2015 0459     Assessment/Plan: 1 Day Post-Op   Principal Problem:   Closed right hip fracture (HCC) Active Problems:   Pulmonary embolism (HCC)   Gastrostomy tube in place ([place])   Seizures ([place])   H/O: stroke with residual effects   AKI (acute kidney injury) ([place])   Fracture of multiple pubic rami (HCC)   Aortic atherosclerosis (HCC)   Fall   Urinary tract infection due to Proteus   Pelvic ring fracture (HCC)   Fracture, intertrochanteric, right femur (HCC)   TDWB RLE PT/OT for bed to chair xfers DVT ppx: resume eliquis today UTI per primary team PO pain control Dispo: SNF placement   Cardell Rachel, Horald Pollen 08/22/2015, 7:37 AM   Rod Can, MD Cell [phone]

## 2015-08-23 DIAGNOSIS — D696 Thrombocytopenia, unspecified: Secondary | ICD-10-CM

## 2015-08-23 DIAGNOSIS — D62 Acute posthemorrhagic anemia: Secondary | ICD-10-CM

## 2015-08-23 LAB — CBC
HCT: 22.5 % — ABNORMAL LOW (ref 36.0–46.0)
HEMATOCRIT: 32.3 % — AB (ref 36.0–46.0)
HEMOGLOBIN: 7.8 g/dL — AB (ref 12.0–15.0)
HEMOGLOBIN: 11.1 g/dL — AB (ref 12.0–15.0)
MCH: 30.4 pg (ref 26.0–34.0)
MCH: 30.1 pg (ref 26.0–34.0)
MCHC: 34.7 g/dL (ref 30.0–36.0)
MCHC: 34.4 g/dL (ref 30.0–36.0)
MCV: 87.5 fL (ref 78.0–100.0)
MCV: 87.5 fL (ref 78.0–100.0)
Platelets: 114 10*3/uL — ABNORMAL LOW (ref 150–400)
Platelets: 157 10*3/uL (ref 150–400)
RBC: 2.57 MIL/uL — ABNORMAL LOW (ref 3.87–5.11)
RBC: 3.69 MIL/uL — AB (ref 3.87–5.11)
RDW: 14.7 % (ref 11.5–15.5)
RDW: 14.4 % (ref 11.5–15.5)
WBC: 9 10*3/uL (ref 4.0–10.5)
WBC: 11.1 10*3/uL — ABNORMAL HIGH (ref 4.0–10.5)

## 2015-08-23 LAB — PREPARE RBC (CROSSMATCH)

## 2015-08-23 MED ORDER — SODIUM CHLORIDE 0.9 % IV SOLN
Freq: Once | INTRAVENOUS | Status: AC
  Administered 2015-08-23: 15:00:00 via INTRAVENOUS

## 2015-08-23 NOTE — Progress Notes (Signed)
[facility] PROGRESS NOTE  Tanya White [id number] DOB: [birth year] DOA: 08/18/2015 PCP: [name], MD  Brief interval history  80 year old African-American female with past medical history of seizures, stroke, pulmonary embolism on anticoagulation, dementia, presented after a mechanical fall. This resulted in pubic rami fracture as well as fracture of the right hip. She was hospitalized for further management. S/P intramedullary fixation of right peritrochanteric femur fracture by orthopedics on 1/5.    Assessment/Plan: #1 right comminuted intertrochanteric femur fracture - Secondary to mechanical fall.  - Patient is status post intramedullary fixation of right peritrochanteric femur fracture by orthopedics on 1/5. - Foley catheter removed 1/6. Resumed Eliquis 1/6. Incontinent of urine. - Possible SNF 1/8.Marland Kitchen  #2 Proteus mirabilis UTI Patient with complaints of dysuria and some suprapubic discomfort. Per family patient was on trimethoprim for prophylaxis. Urine culture sensitivities with resistance to trimethoprim. Change IV Rocephin to IV ampicillin.. DC on oral antibiotics.  #3 pulmonary embolus Patient was on eliquis, which was held in anticipation of surgery - resumed postop on 1/6.   #4 history of gastrostomy tube Patient used to have a PEG tube after her stroke however no longer present.  #5 history of seizures Continue Vimpat and Depakote. Discontinued when necessary Ativan for now as patient was given a dose of Ativan early 1/4 morning and had some confusion per nursing. Confusion improved and patient currently at baseline per family.  #6 history of stroke with residual deficits PT/OT postop.  #7 acute renal failure Likely secondary to prerenal azotemia. resolved  #8 acute blood loss anemia complicating chronic anemia Anemia panel consistent with anemia of chronic disease and iron deficiency. - Baseline hemoglobin probably in the 11 g range. Transfused one  unit of PRBC on 1/5. Hemoglobin has again dropped from 8.7 on 1/6 to 7.8. - Discussed with orthopedics who recommend PRBC. Given recent surgery and on anticoagulation, will transfuse 1 unit PRBC and follow CBC in a.m.  #9 Thrombocytopenia - Stable. May be related to acute blood loss.  #10 dementia - Pleasantly confused. Mental status may be at baseline. No agitation reported.  DVT prophylaxis. Eliquis full dose Code Status: Full Family Communication: None at bedside.  Disposition Plan: DC to SNF possibly 1/8   Consultants:  Orthopedics: Dr. Lyla Glassing 08/18/2015  Procedures:  CT pelvis 08/18/2015  Chest x-ray 08/18/2015  Plain films of the right hip and pelvis 08/18/2015  Plain films of the right femur 08/18/2015  S/P intramedullary fixation of right peritrochanteric femur fracture by orthopedics on 1/5.    Antibiotics:  IV Rocephin 08/20/2015>>.Marland Kitchen 08/21/2015  IV ampicillin 08/21/2015  HPI/Subjective: Pleasantly confused. As per RN, no acute issues. Urinary incontinence.  Objective:  Filed Vitals:   08/23/15 0219 08/23/15 0621 08/23/15 0941 08/23/15 1046  BP: 123/58  109/50 112/56  Pulse: 114  111 113  Temp: 99.2 F (37.3 C)  99 F (37.2 C)   TempSrc: Oral Oral Oral   Resp: 16  16   Height:      Weight:      SpO2: 97%  97%      Intake/Output Summary (Last 24 hours) at 08/23/15 1211 Last data filed at 08/23/15 0942  Gross per 24 hour  Intake    710 ml  Output    375 ml  Net    335 ml   Filed Weights   08/18/15 1800  Weight: 43.5 kg (95 lb 14.4 oz)    Exam:   General: Pleasant elderly female lying comfortably propped up in bed.  Cardiovascular: RRR. No JVD, murmurs or pedal edema.  Respiratory: Clear to auscultation. No increased work of breathing.  Abdomen: Soft/NT/ND/+BS  Musculoskeletal: No c/c/e  CNS: Alert and oriented to person and partly to place. No focal deficits.  Extremities: Right hip fracture surgical site dressing clean and  dry.  Data Reviewed: Basic Metabolic Panel:  Recent Labs Lab 08/18/15 1347 08/19/15 1018 08/20/15 0505 08/21/15 0656 08/22/15 0459  NA 139 139 139 140 142  K 4.4 4.0 4.6 4.7 4.7  CL 103 105 107 108 110  CO2 27 24 26 24 25   GLUCOSE 148* 157* 119* 113* 114*  BUN 26* 21* 21* 23* 20  CREATININE 1.21* 1.24* 1.09* 1.03* 0.80  CALCIUM 8.9 8.9 8.4* 8.4* 8.5*   Liver Function Tests:  Recent Labs Lab 08/18/15 1347  AST 25  ALT 14  ALKPHOS 51  BILITOT 0.6  PROT 7.2  ALBUMIN 4.0   No results for input(s): LIPASE, AMYLASE in the last 168 hours. No results for input(s): AMMONIA in the last 168 hours. CBC:  Recent Labs Lab 08/18/15 1347  08/20/15 0505 08/21/15 0656 08/21/15 2105 08/22/15 0459 08/23/15 0455  WBC 9.1  < > 9.9 8.0 10.9* 8.3 9.0  NEUTROABS 7.2  --   --   --   --   --   --   HGB 11.9*  < > 9.2* 8.7* 7.5* 8.7* 7.8*  HCT 34.5*  < > 26.4* 24.3* 21.6* 24.7* 22.5*  MCV 86.9  < > 85.2 86.5 86.1 87.3 87.5  PLT 156  < > 103* 125* 102* 104* 114*  < > = values in this interval not displayed. Cardiac Enzymes: No results for input(s): CKTOTAL, CKMB, CKMBINDEX, TROPONINI in the last 168 hours. BNP (last 3 results) No results for input(s): BNP in the last 8760 hours.  ProBNP (last 3 results) No results for input(s): PROBNP in the last 8760 hours.  CBG: No results for input(s): GLUCAP in the last 168 hours.  Recent Results (from the past 240 hour(s))  Urine culture     Status: None   Collection Time: 08/18/15  5:33 PM  Result Value Ref Range Status   Specimen Description URINE, RANDOM  Final   Special Requests NONE  Final   Culture   Final    >=100,000 COLONIES/mL PROTEUS MIRABILIS Performed at [facility]    Report Status 08/20/2015 FINAL  Final   Organism ID, Bacteria PROTEUS MIRABILIS  Final      Susceptibility   Proteus mirabilis - MIC*    AMPICILLIN <=2 SENSITIVE Sensitive     CEFAZOLIN <=4 SENSITIVE Sensitive     CEFTRIAXONE <=1 SENSITIVE  Sensitive     CIPROFLOXACIN >=4 RESISTANT Resistant     GENTAMICIN 8 INTERMEDIATE Intermediate     IMIPENEM 1 SENSITIVE Sensitive     NITROFURANTOIN 128 RESISTANT Resistant     TRIMETH/SULFA >=320 RESISTANT Resistant     AMPICILLIN/SULBACTAM <=2 SENSITIVE Sensitive     PIP/TAZO <=4 SENSITIVE Sensitive     * >=100,000 COLONIES/mL PROTEUS MIRABILIS  Surgical pcr screen     Status: Abnormal   Collection Time: 08/19/15  6:41 AM  Result Value Ref Range Status   MRSA, PCR POSITIVE (A) NEGATIVE Final    Comment: RESULT CALLED TO, READ BACK BY AND VERIFIED WITH: TARTELOW RN AT [id number] ON 1.3.17 BY SHUEA    Staphylococcus aureus POSITIVE (A) NEGATIVE Final    Comment:        The Xpert SA Assay (FDA  approved for NASAL specimens in patients over 48 years of age), is one component of a comprehensive surveillance program.  Test performance has been validated by [facility] for patients greater than or equal to 74 year old. It is not intended to diagnose infection nor to guide or monitor treatment.      Studies: Dg C-arm 1-60 Min-no Report  08/21/2015  CLINICAL DATA: hip C-ARM 1-60 MINUTES Fluoroscopy was utilized by the requesting physician.  No radiographic interpretation.   Dg Hip Unilat With Pelvis 1v Right  08/21/2015  CLINICAL DATA:  Postoperative ORIF right hip fracture. EXAM: DG HIP (WITH OR WITHOUT PELVIS) 1V RIGHT COMPARISON:  Intraoperative fluoroscopic films August 21, 2015 FINDINGS: Post ORIF of right femur fracture without malalignment. Intra medullary rod fixation with screw is identified unchanged. Soft tissue air and edema is identified in the right hip. There is fracture of the right superior and inferior pubic rami. IMPRESSION: Post ORIF of right femoral fracture without malalignment. Electronically Signed   By: Abelardo Diesel M.D.   On: 08/21/2015 20:46   Dg Hip Operative Unilat With Pelvis Right  08/21/2015  CLINICAL DATA:  Right hip fracture, post ORIF EXAM: DG C-ARM 1-60  MIN-NO REPORT; OPERATIVE RIGHT HIP WITH PELVIS FLUOROSCOPY TIME:  5 minutes, 44 seconds (32.6 mGy) COMPARISON:  Right hip radiographs - 08/18/2015 FINDINGS: Two intraoperative fluoroscopic images of the right hip are provided for review Images demonstrate the sequela of intra medullary rod fixation of the proximal femur and dynamic screw fixation of the right femoral neck traversing the previously identified comminuted intertrochanteric femur fracture. Alignment appears near anatomic. There is a minimal amount of expected adjacent subcutaneous emphysema. No radiopaque foreign body. IMPRESSION: Post ORIF of right femur fracture without evidence of complication. Electronically Signed   By: Sandi Mariscal M.D.   On: 08/21/2015 19:44    Scheduled Meds: . sodium chloride   Intravenous Once  . ampicillin (OMNIPEN) IV  500 mg Intravenous 3 times per day  . apixaban  2.5 mg Oral BID  . divalproex  250 mg Oral BID  . docusate sodium  100 mg Oral BID  . famotidine  20 mg Oral Daily  . feeding supplement  1 Container Oral TID BM  . ferrous sulfate  325 mg Oral Daily  . lacosamide  200 mg Oral BID  . magnesium oxide  400 mg Oral Daily  . metoprolol tartrate  37.5 mg Oral BID  . mupirocin ointment  1 application Nasal BID  . polyethylene glycol  17 g Oral Daily  . senna  1 tablet Oral BID   Continuous Infusions:    Principal Problem:   Closed right hip fracture (HCC) Active Problems:   Pulmonary embolism (HCC)   Gastrostomy tube in place ([place])   Seizures ([place])   H/O: stroke with residual effects   AKI (acute kidney injury) ([place])   Fracture of multiple pubic rami (HCC)   Aortic atherosclerosis (HCC)   Fall   Urinary tract infection due to Proteus   Pelvic ring fracture (HCC)   Fracture, intertrochanteric, right femur ([place])    Time spent: 20 mins   Imanie Darrow, MD, FACP, FHM. [facility] Pager [phone]  If 7PM-7AM, please contact night-coverage [email or web address] Password  [email or web address] 08/23/2015, 12:11 PM    LOS: 5 days

## 2015-08-23 NOTE — Clinical Social Work Note (Signed)
CSW spoke with MD who stated that pt will not be ready for discharge today.  CSW let [facility] know that pt may be ready for d/c tomorrow but not today  .Dede Query, LCSW [facility] Clinical Social Worker - Weekend Coverage cell #: [phone]

## 2015-08-23 NOTE — Progress Notes (Signed)
Pt. Temp. 100.0 prior to blood administration. Per MD, ok to give Tylenol per orders then transfuse 1 unit of blood

## 2015-08-23 NOTE — Progress Notes (Signed)
   Subjective: 2 Days Post-Op Procedure(s) (LRB): INTRAMEDULLARY (IM) RETROGRADE FEMORAL NAILING (Right)  Alert but disoriented this morning Minimal pain to right hip/leg  Stable currently Patient reports pain as mild.  Objective:   VITALS:   Filed Vitals:   08/22/15 2122 08/23/15 0219  BP: 134/51 123/58  Pulse: 98 114  Temp: 98.7 F (37.1 C) 99.2 F (37.3 C)  Resp: 16 16    Right hip incision healing well nv intact distally No rashes or edema  LABS  Recent Labs  08/21/15 2105 08/22/15 0459 08/23/15 0455  HGB 7.5* 8.7* 7.8*  HCT 21.6* 24.7* 22.5*  WBC 10.9* 8.3 9.0  PLT 102* 104* 114*     Recent Labs  08/21/15 0656 08/22/15 0459  NA 140 142  K 4.7 4.7  BUN 23* 20  CREATININE 1.03* 0.80  GLUCOSE 113* 114*     Assessment/Plan: 2 Days Post-Op Procedure(s) (LRB): INTRAMEDULLARY (IM) RETROGRADE FEMORAL NAILING (Right) Acute blood loss anemia - recommend transfusion but will let medical team determine and order Continue PT/OT as able Agree with SNF placement Pain management as needed    Merla Riches, MPAS, PA-C  08/23/2015, 6:49 AM

## 2015-08-24 DIAGNOSIS — S32501S Unspecified fracture of right pubis, sequela: Secondary | ICD-10-CM

## 2015-08-24 DIAGNOSIS — N179 Acute kidney failure, unspecified: Secondary | ICD-10-CM

## 2015-08-24 DIAGNOSIS — S72001D Fracture of unspecified part of neck of right femur, subsequent encounter for closed fracture with routine healing: Secondary | ICD-10-CM

## 2015-08-24 LAB — CBC
HCT: 29.3 % — ABNORMAL LOW (ref 36.0–46.0)
Hemoglobin: 10.2 g/dL — ABNORMAL LOW (ref 12.0–15.0)
MCH: 30.3 pg (ref 26.0–34.0)
MCHC: 34.8 g/dL (ref 30.0–36.0)
MCV: 86.9 fL (ref 78.0–100.0)
PLATELETS: 141 10*3/uL — AB (ref 150–400)
RBC: 3.37 MIL/uL — AB (ref 3.87–5.11)
RDW: 14.6 % (ref 11.5–15.5)
WBC: 10.5 10*3/uL (ref 4.0–10.5)

## 2015-08-24 MED ORDER — POLYETHYLENE GLYCOL 3350 17 G PO PACK: 17.0000 g | PACK | Freq: Every day | ORAL | Status: DC | PRN

## 2015-08-24 MED ORDER — DOCUSATE SODIUM 100 MG PO CAPS: 100.0000 mg | ORAL_CAPSULE | Freq: Two times a day (BID) | ORAL | Status: DC | PRN

## 2015-08-24 MED ORDER — ACETAMINOPHEN 325 MG PO TABS: 650.0000 mg | ORAL_TABLET | Freq: Four times a day (QID) | ORAL | Status: DC | PRN

## 2015-08-24 MED ORDER — LORAZEPAM 2 MG/ML IJ SOLN: 1.0000 mg | Freq: Four times a day (QID) | INTRAMUSCULAR | Status: DC | PRN

## 2015-08-24 MED ORDER — CEFUROXIME AXETIL 250 MG PO TABS
250.0000 mg | ORAL_TABLET | Freq: Two times a day (BID) | ORAL | Status: DC
  Filled 2015-08-24 (×3): qty 1

## 2015-08-24 MED ORDER — METHOCARBAMOL 500 MG PO TABS: 500.0000 mg | ORAL_TABLET | Freq: Three times a day (TID) | ORAL | Status: DC | PRN

## 2015-08-24 MED ORDER — BOOST / RESOURCE BREEZE PO LIQD: 1.0000 | Freq: Three times a day (TID) | ORAL | Status: AC

## 2015-08-24 MED ORDER — CEFUROXIME AXETIL 250 MG PO TABS: 250.0000 mg | ORAL_TABLET | Freq: Two times a day (BID) | ORAL | Status: DC

## 2015-08-24 NOTE — Discharge Summary (Signed)
Physician Discharge Summary  Tanya White [id number] DOB: 1935/11/01 DOA: 08/18/2015  PCP: [name], MD  Admit date: 08/18/2015 Discharge date: 08/24/2015  Time spent: Greater than 30 minutes  Recommendations for Outpatient Follow-up:  1. MD at SNF in 3 days with repeat labs (CBC & BMP). 2. Dr. Rod Can, Orthopedics in 2 weeks.  Discharge Diagnoses:  Principal Problem:   Closed right hip fracture ([name]) Active Problems:   Pulmonary embolism (H[facility])   Gastrostomy tube in place ([name])   Seizures ([name])   H/O: stroke with residual effects   AKI (acute kidney injury) ([name])   Fracture of multiple pubic rami (HCC)   Aortic atherosclerosis ([place])   Fall   Urinary tract infection due to Proteus   Pelvic ring fracture (HCC)   Fracture, intertrochanteric, right femur ([name])   Discharge Condition: Improved & Stable  Diet recommendation: Heart Healthy diet.  Filed Weights   08/18/15 1800  Weight: 43.5 kg (95 lb 14.4 oz)    History of present illness:  80 year old African-American female with past medical history of seizures, stroke, pulmonary embolism on anticoagulation, dementia, presented after a mechanical fall. This resulted in pubic rami fracture as well as fracture of the right hip. She was hospitalized for further management. S/P intramedullary fixation of right peritrochanteric femur fracture by orthopedics on 1/5.  Hospital Course:   #1 right comminuted intertrochanteric femur fracture & right superior and inferior pubic rami fractures - Secondary to mechanical fall.  - Patient is status post intramedullary fixation of right peritrochanteric femur fracture by orthopedics on 1/5. - Foley catheter removed 1/6. Resumed Eliquis 1/6. Incontinent of urine. - Discussed with orthopedics and they're follow-up appreciated. Cleared by orthopedics for DC to SNF: Recommend weightbearing as tolerated on right lower extremity, keep same postop dressing until follow-up with  orthopedics in 2 weeks-may shower but keep dressing clean and dry and no soaking in water, already on anticoagulation for prior PE. - Pelvic fracture i.e. right superior and inferior pubic rami fracture to be managed conservatively/nonoperatively.  #2 Proteus mirabilis UTI Patient with complaints of dysuria and some suprapubic discomfort. Per family patient was on trimethoprim for prophylaxis. Urine culture sensitivities with resistance to trimethoprim. Treated in the hospital with IV antibiotics for 3 days including IV Rocephin and ampicillin. Will discharge on oral Ceftin to complete total 7 days course. Continue prophylactic trimethoprim.  #3 pulmonary embolus Patient was on eliquis, which was held in anticipation of surgery - resumed postop on 1/6.   #4 history of gastrostomy tube Patient used to have a PEG tube after her stroke however no longer present.  #5 history of seizures Continue Vimpat and Depakote. Discontinued when necessary Ativan for now as patient was given a dose of Ativan early 1/4 morning and had some confusion per nursing. Confusion improved and patient currently at baseline per family. Patient is on IM Ativan when necessary for seizures at [facility] home, prior to admission. Unknown as to when she had her last seizure.  #6 history of stroke with residual deficits -Continue therapies at SNF.  #7 acute renal failure Likely secondary to prerenal azotemia. resolved  #8 acute blood loss anemia complicating chronic anemia Anemia panel consistent with anemia of chronic disease and iron deficiency. - Baseline hemoglobin probably in the 11 g range. Transfused one unit of PRBC on 1/5. Hemoglobin has again dropped from 8.7 on 1/6 to 7.8. - Transfused additional unit of PRBC on 08/23/15. Hemoglobin improved to 10.2 today. Follow CBC closely as outpatient. Continue iron  supplements.  #9 Thrombocytopenia - Stable. May be related to acute blood loss. Follow CBCs as  outpatient.  #10 dementia - Pleasantly confused. Mental status may be at baseline. No agitation reported.  Consultants:  Orthopedics: Dr. Lyla Glassing 08/18/2015  Procedures:  CT pelvis 08/18/2015  Chest x-ray 08/18/2015  Plain films of the right hip and pelvis 08/18/2015  Plain films of the right femur 08/18/2015  S/P intramedullary fixation of right peritrochanteric femur fracture by orthopedics on 1/5.  Foley catheter-removed.   Antibiotics:  IV Rocephin 08/20/2015>>.Marland Kitchen 08/21/2015  IV ampicillin 08/21/2015 >08/23/15  Oral Ceftin 08/24/15 > 08/26/15.   Discharge Exam:  Complaints: Patient remains pleasantly confused and denies complaints. As per RN, no acute events. Eating some. Incontinent of urine and stools but no diarrhea. No significant pain issues reported.  Filed Vitals:   08/23/15 1810 08/23/15 2036 08/24/15 0607 08/24/15 0950  BP: 130/69 132/64 128/77 115/60  Pulse: 107 105 101 82  Temp: 98.4 F (36.9 C) 98.4 F (36.9 C) 98.1 F (36.7 C)   TempSrc: Oral Oral Oral   Resp: 16 14 16    Height:      Weight:      SpO2: 97% 98% 99%      General: Pleasant elderly female lying comfortably propped up in bed.  Cardiovascular: RRR. No JVD, murmurs or pedal edema.  Respiratory: Clear to auscultation. No increased work of breathing.  Abdomen: Soft/NT/ND/+BS  Musculoskeletal: No c/c/e.   CNS: Alert and oriented to person and partly to place. No focal deficits.  Extremities: Right hip fracture surgical site dressing clean and dry. Moving all limbs well. Right lower extremity movements to slightly limited due to hip pain from surgery. Good peripheral pulses symmetrically felt.   Discharge Instructions      Discharge Instructions    Call MD for:  difficulty breathing, headache or visual disturbances    Complete by:  As directed      Call MD for:  extreme fatigue    Complete by:  As directed      Call MD for:  hives    Complete by:  As directed      Call MD  for:  persistant dizziness or light-headedness    Complete by:  As directed      Call MD for:  persistant nausea and vomiting    Complete by:  As directed      Call MD for:  redness, tenderness, or signs of infection (pain, swelling, redness, odor or green/yellow discharge around incision site)    Complete by:  As directed      Call MD for:  severe uncontrolled pain    Complete by:  As directed      Call MD for:  temperature >100.4    Complete by:  As directed      Diet - low sodium heart healthy    Complete by:  As directed      Discharge instructions    Complete by:  As directed   Partial weight bearing on Right Lower extremity.     Increase activity slowly    Complete by:  As directed             Medication List    TAKE these medications        acetaminophen 325 MG tablet  Commonly known as:  TYLENOL  Take 2 tablets (650 mg total) by mouth every 6 (six) hours as needed for mild pain or fever.     albuterol (2.5  MG/3ML) 0.083% nebulizer solution  Commonly known as:  PROVENTIL  Take 2.5 mg by nebulization every 2 (two) hours as needed for wheezing or shortness of breath.     apixaban 2.5 MG Tabs tablet  Commonly known as:  ELIQUIS  Take 1 tablet (2.5 mg total) by mouth 2 (two) times daily.     cefUROXime 250 MG tablet  Commonly known as:  CEFTIN  Take 1 tablet (250 mg total) by mouth 2 (two) times daily with a meal. Discontinue after 08/26/2015 doses.     Cranberry 250 MG Tabs  Take 250 mg by mouth daily.     divalproex 250 MG DR tablet  Commonly known as:  DEPAKOTE  Take 250 mg by mouth 2 (two) times daily.     docusate sodium 100 MG capsule  Commonly known as:  COLACE  Take 1 capsule (100 mg total) by mouth 2 (two) times daily as needed for mild constipation.     famotidine 20 MG tablet  Commonly known as:  PEPCID  Take 20 mg by mouth daily.     feeding supplement Liqd  Take 1 Container by mouth 3 (three) times daily between meals.     ferrous sulfate 325  (65 FE) MG tablet  Take 1 tablet (325 mg total) by mouth daily.     furosemide 40 MG tablet  Commonly known as:  LASIX  Take 1 tablet (40 mg total) by mouth daily as needed (for leg swelling.  Give PRN potassium when Lasix is given).     lacosamide 200 MG Tabs tablet  Commonly known as:  VIMPAT  Take 1 tablet (200 mg total) by mouth 2 (two) times daily.     LORazepam 2 MG/ML injection  Commonly known as:  ATIVAN  Inject 0.5 mLs (1 mg total) into the muscle every 6 (six) hours as needed for seizure.     magnesium oxide 400 MG tablet  Commonly known as:  MAG-OX  Take 1 tablet (400 mg total) by mouth daily.     methocarbamol 500 MG tablet  Commonly known as:  ROBAXIN  Take 1 tablet (500 mg total) by mouth every 8 (eight) hours as needed for muscle spasms.     metoprolol tartrate 25 MG tablet  Commonly known as:  LOPRESSOR  Take 37.5 mg by mouth 2 (two) times daily.     ondansetron 4 MG tablet  Commonly known as:  ZOFRAN  Take 1 tablet (4 mg total) by mouth every 4 (four) hours as needed for nausea.     polyethylene glycol packet  Commonly known as:  MIRALAX / GLYCOLAX  Take 17 g by mouth daily as needed for moderate constipation.     potassium chloride SA 20 MEQ tablet  Commonly known as:  KLOR-CON M20  Take 1 tablet (20 mEq total) by mouth daily as needed (whenever she is given Lasix).     PRESCRIPTION MEDICATION  Apply 1 mg topically every 6 (six) hours as needed (agitation/anxiety). Ativan Gel 1 mg     trimethoprim 100 MG tablet  Commonly known as:  TRIMPEX  Take 100 mg by mouth daily.       Follow-up Information    Follow up with Swinteck, Horald Pollen, MD. Schedule an appointment as soon as possible for a visit in 2 weeks.   Specialty:  Orthopedic Surgery   Why:  For wound re-check   Contact information:   [address] [place] [address] [phone]  Follow up with [facility] .   Specialty:  [place] information:   [place] [phone]       The results of significant diagnostics from this hospitalization (including imaging, microbiology, ancillary and laboratory) are listed below for reference.    Significant Diagnostic Studies: Dg Chest 1 View  08/18/2015  CLINICAL DATA:  Preop EXAM: CHEST 1 VIEW COMPARISON:  06/08/2015 FINDINGS: Moderate cardiomegaly. Aorta is tortuous with atherosclerotic calcification. No consolidation or lung mass. Bibasilar nodular densities are likely nipple shadows. IMPRESSION: Cardiomegaly without decompensation. Electronically Signed   By: Marybelle Killings M.D.   On: 08/18/2015 13:29   Ct Pelvis Wo Contrast  08/18/2015  CLINICAL DATA:  Right proximal femur fracture. EXAM: CT PELVIS WITHOUT CONTRAST TECHNIQUE: Multidetector CT imaging of the pelvis was performed following the standard protocol without intravenous contrast. COMPARISON:  Radiographs of same day.  CT scan of February 27, 2014. FINDINGS: Mildly displaced fractures are seen involving the right superior and inferior pubic rami. Moderately displaced and comminuted fracture is seen involving the trochanteric region of the proximal right femur. No other fracture is noted in the pelvis or sacrum. Atherosclerosis of abdominal aorta and iliac arteries is noted. There is no evidence of bowel obstruction. Sigmoid diverticulosis is noted without inflammation. Probable mild hemorrhage is seen along the right pelvic sidewall. Urinary bladder and uterus appear normal. IMPRESSION: Mildly displaced fractures are seen involving the right superior and inferior pubic rami. Moderately displaced and comminuted fracture of intertrochanteric region of proximal right femur is noted. Probable mild hematoma seen along right pelvic sidewall. Electronically Signed   By: Marijo Conception, M.D.   On: 08/18/2015 15:35   Dg C-arm 1-60 Min-no Report  08/21/2015  CLINICAL DATA: hip  C-ARM 1-60 MINUTES Fluoroscopy was utilized by the requesting physician.  No radiographic interpretation.   Dg Hip Unilat With Pelvis 1v Right  08/21/2015  CLINICAL DATA:  Postoperative ORIF right hip fracture. EXAM: DG HIP (WITH OR WITHOUT PELVIS) 1V RIGHT COMPARISON:  Intraoperative fluoroscopic films August 21, 2015 FINDINGS: Post ORIF of right femur fracture without malalignment. Intra medullary rod fixation with screw is identified unchanged. Soft tissue air and edema is identified in the right hip. There is fracture of the right superior and inferior pubic rami. IMPRESSION: Post ORIF of right femoral fracture without malalignment. Electronically Signed   By: Abelardo Diesel M.D.   On: 08/21/2015 20:46   Dg Hip Operative Unilat With Pelvis Right  08/21/2015  CLINICAL DATA:  Right hip fracture, post ORIF EXAM: DG C-ARM 1-60 MIN-NO REPORT; OPERATIVE RIGHT HIP WITH PELVIS FLUOROSCOPY TIME:  5 minutes, 44 seconds (32.6 mGy) COMPARISON:  Right hip radiographs - 08/18/2015 FINDINGS: Two intraoperative fluoroscopic images of the right hip are provided for review Images demonstrate the sequela of intra medullary rod fixation of the proximal femur and dynamic screw fixation of the right femoral neck traversing the previously identified comminuted intertrochanteric femur fracture. Alignment appears near anatomic. There is a minimal amount of expected adjacent subcutaneous emphysema. No radiopaque foreign body. IMPRESSION: Post ORIF of right femur fracture without evidence of complication. Electronically Signed   By: Sandi Mariscal M.D.   On: 08/21/2015 19:44   Dg Hip Unilat With Pelvis 2-3 Views Right  08/18/2015  CLINICAL DATA:  Acute right hip pain following fall today. Initial encounter. EXAM: DG HIP (WITH OR WITHOUT PELVIS) 2-3V RIGHT COMPARISON:  None. FINDINGS: A comminuted intertrochanteric  fracture of the proximal right femur is noted with varus angulation. There is no evidence of subluxation or dislocation.  Fractures of the superior and inferior right pubic rami are also noted. Degenerative changes in the left hip are present. IMPRESSION: Comminuted intertrochanteric right femur fracture with varus angulation. Superior and inferior right pubic rami fractures. Electronically Signed   By: Margarette Canada M.D.   On: 08/18/2015 13:28   Dg Femur, Min 2 Views Right  08/18/2015  CLINICAL DATA:  Right hip fracture.  Severe right hip pain. EXAM: RIGHT FEMUR 2 VIEWS COMPARISON:  None. FINDINGS: Osteopenia. The mid and distal femur are intact. Unremarkable soft tissues. IMPRESSION: No evidence of acute bony injury in the mid or distal femur. Electronically Signed   By: Marybelle Killings M.D.   On: 08/18/2015 14:55    Microbiology: Recent Results (from the past 240 hour(s))  Urine culture     Status: None   Collection Time: 08/18/15  5:33 PM  Result Value Ref Range Status   Specimen Description URINE, RANDOM  Final   Special Requests NONE  Final   Culture   Final    >=100,000 COLONIES/mL PROTEUS MIRABILIS Performed at [facility]    Report Status 08/20/2015 FINAL  Final   Organism ID, Bacteria PROTEUS MIRABILIS  Final      Susceptibility   Proteus mirabilis - MIC*    AMPICILLIN <=2 SENSITIVE Sensitive     CEFAZOLIN <=4 SENSITIVE Sensitive     CEFTRIAXONE <=1 SENSITIVE Sensitive     CIPROFLOXACIN >=4 RESISTANT Resistant     GENTAMICIN 8 INTERMEDIATE Intermediate     IMIPENEM 1 SENSITIVE Sensitive     NITROFURANTOIN 128 RESISTANT Resistant     TRIMETH/SULFA >=320 RESISTANT Resistant     AMPICILLIN/SULBACTAM <=2 SENSITIVE Sensitive     PIP/TAZO <=4 SENSITIVE Sensitive     * >=100,000 COLONIES/mL PROTEUS MIRABILIS  Surgical pcr screen     Status: Abnormal   Collection Time: 08/19/15  6:41 AM  Result Value Ref Range Status   MRSA, PCR POSITIVE (A) NEGATIVE Final    Comment: RESULT CALLED TO, READ BACK BY AND VERIFIED WITH: TARTELOW RN AT UJ:[id number] ON 1.3.17 BY SHUEA    Staphylococcus aureus POSITIVE (A)  NEGATIVE Final    Comment:        The Xpert SA Assay (FDA approved for NASAL specimens in patients over 71 years of age), is one component of a comprehensive surveillance program.  Test performance has been validated by [facility] for patients greater than or equal to 71 year old. It is not intended to diagnose infection nor to guide or monitor treatment.      Labs: Basic Metabolic Panel:  Recent Labs Lab 08/18/15 1347 08/19/15 1018 08/20/15 0505 08/21/15 0656 08/22/15 0459  NA 139 139 139 140 142  K 4.4 4.0 4.6 4.7 4.7  CL 103 105 107 108 110  CO2 27 24 26 24 25   GLUCOSE 148* 157* 119* 113* 114*  BUN 26* 21* 21* 23* 20  CREATININE 1.21* 1.24* 1.09* 1.03* 0.80  CALCIUM 8.9 8.9 8.4* 8.4* 8.5*   Liver Function Tests:  Recent Labs Lab 08/18/15 1347  AST 25  ALT 14  ALKPHOS 51  BILITOT 0.6  PROT 7.2  ALBUMIN 4.0   No results for input(s): LIPASE, AMYLASE in the last 168 hours. No results for input(s): AMMONIA in the last 168 hours. CBC:  Recent Labs Lab 08/18/15 1347  08/21/15 2105 08/22/15 0459 08/23/15 0455 08/23/15  1925 08/24/15 0445  WBC 9.1  < > 10.9* 8.3 9.0 11.1* 10.5  NEUTROABS 7.2  --   --   --   --   --   --   HGB 11.9*  < > 7.5* 8.7* 7.8* 11.1* 10.2*  HCT 34.5*  < > 21.6* 24.7* 22.5* 32.3* 29.3*  MCV 86.9  < > 86.1 87.3 87.5 87.5 86.9  PLT 156  < > 102* 104* 114* 157 141*  < > = values in this interval not displayed. Cardiac Enzymes: No results for input(s): CKTOTAL, CKMB, CKMBINDEX, TROPONINI in the last 168 hours. BNP: BNP (last 3 results) No results for input(s): BNP in the last 8760 hours.  ProBNP (last 3 results) No results for input(s): PROBNP in the last 8760 hours.  CBG: No results for input(s): GLUCAP in the last 168 hours.      Signed:  Vernell Leep, MD, FACP, FHM. [facility] Pager [phone]  If 7PM-7AM, please contact night-coverage [email or web address] Password [email or web address] 08/24/2015, 11:12 AM

## 2015-08-24 NOTE — Progress Notes (Signed)
Report called to [employer] at [facility]. [facility] contacted for pick up. Pt. Ready for discharge.

## 2015-08-24 NOTE — Progress Notes (Signed)
Subjective: 3 Days Post-Op Procedure(s) (LRB): INTRAMEDULLARY (IM) RETROGRADE FEMORAL NAILING (Right) Patient reports pain as mild to right leg.  Resting well in bed. No CP, SOB, or calf pain.  Objective: Vital signs in last 24 hours: Temp:  [98 F (36.7 C)-100 F (37.8 C)] 98.1 F (36.7 C) (01/08 0607) Pulse Rate:  [101-113] 101 (01/08 0607) Resp:  [14-16] 16 (01/08 0607) BP: (109-132)/(50-77) 128/77 mmHg (01/08 0607) SpO2:  [97 %-100 %] 99 % (01/08 0607)  Intake/Output from previous day: 01/07 0701 - 01/08 0700 In: 772 [P.O.:120; I.V.:260; Blood:342; IV Piggyback:50] Out: -  Intake/Output this shift:     Recent Labs  08/21/15 2105 08/22/15 0459 08/23/15 0455 08/23/15 1925 08/24/15 0445  HGB 7.5* 8.7* 7.8* 11.1* 10.2*    Recent Labs  08/23/15 1925 08/24/15 0445  WBC 11.1* 10.5  RBC 3.69* 3.37*  HCT 32.3* 29.3*  PLT 157 141*    Recent Labs  08/22/15 0459  NA 142  K 4.7  CL 110  CO2 25  BUN 20  CREATININE 0.80  GLUCOSE 114*  CALCIUM 8.5*   No results for input(s): LABPT, INR in the last 72 hours.  Well nourished. Alert and oriented x3. RRR, Lungs clear, BS x4. Abdomen soft and non tender. Right Calf soft and non tender. Right dressing C/D/I. No DVT signs. Compartment soft. No signs of infection.  Right LE grossly neurovascular intact.  Assessment/Plan: 3 Days Post-Op Procedure(s) (LRB): INTRAMEDULLARY (IM) RETROGRADE FEMORAL NAILING (Right)  Up with PT Partial weight bearing to RLE Plan to D/C to SNF Medicine following F/U in office with DR. Swinteck in 2 weeks Keep dressing clean and dry  Marquette Piontek L 08/24/2015, 8:52 AM

## 2015-08-24 NOTE — Clinical Social Work Note (Signed)
CSW provided discharge paperwork to RN.  RN will call [facility] when pt is ready for transport.  Discharge paperwork has been sent through the [facility].  Pt's nephew aware that he will discharge to SNF today.  Dede Query, LCSW [place] Worker - Weekend Coverage cell #: [phone]

## 2015-08-24 NOTE — Clinical Social Work Note (Signed)
CSW spoke with MD who stated that pt is ready for d/c today.  Pt is going back to [facility] and they have requested the D/c summary by 11:00 due to their pharmacy being closed on sundays.  MD is working on summary.  CSW called and spoke with pt's nephew, Chrissie Noa to provide discharge information.  He wants pt transported via ambulance to facility. CSW will prepare packet and call for transport when paperwork complete.  Dede Query, LCSW [place] Worker - Weekend Coverage cell #: [phone]

## 2015-08-24 NOTE — Progress Notes (Signed)
[facility] here for pick up. Pt. Alert and confused at times. No respiratory distress noted. Discharged to [facility].

## 2015-08-24 NOTE — Plan of Care (Signed)
Problem: Education: Goal: Knowledge of the prescribed therapeutic regimen will improve Outcome: Adequate for Discharge Pt. Confused at times, discharge to [facility]

## 2015-08-25 LAB — TYPE AND SCREEN
ABO/RH(D): B POS
Antibody Screen: NEGATIVE
Unit division: 0

## 2015-08-25 NOTE — Anesthesia Postprocedure Evaluation (Signed)
Anesthesia Post Note  Patient: Investment banker, operational  Procedure(s) Performed: Procedure(s) (LRB): INTRAMEDULLARY (IM) RETROGRADE FEMORAL NAILING (Right)  Patient location during evaluation: PACU Anesthesia Type: General Level of consciousness: awake Pain management: pain level controlled Vital Signs Assessment: post-procedure vital signs reviewed and stable Respiratory status: spontaneous breathing Cardiovascular status: stable Postop Assessment: no signs of nausea or vomiting Anesthetic complications: no    Last Vitals:  Filed Vitals:   08/24/15 0950 08/24/15 1152  BP: 115/60 142/84  Pulse: 82 94  Temp:    Resp:  16    Last Pain:  Filed Vitals:   08/24/15 1152  PainSc: Asleep                 Timm Bonenberger

## 2015-09-22 ENCOUNTER — Other Ambulatory Visit: Payer: Self-pay | Admitting: Internal Medicine

## 2015-09-22 DIAGNOSIS — R569 Unspecified convulsions: Secondary | ICD-10-CM

## 2015-09-24 ENCOUNTER — Ambulatory Visit
Admission: RE | Admit: 2015-09-24 | Discharge: 2015-09-24 | Disposition: A | Discharge status: Home / Self Care | Payer: Medicare Other | Source: Ambulatory Visit | Attending: Internal Medicine | Admitting: Internal Medicine

## 2015-09-24 DIAGNOSIS — R569 Unspecified convulsions: Secondary | ICD-10-CM

## 2015-10-23 ENCOUNTER — Ambulatory Visit: Payer: Medicare Other | Admitting: Nurse Practitioner

## 2016-01-14 ENCOUNTER — Ambulatory Visit: Payer: Medicare Other | Admitting: Adult Health

## 2016-01-14 ENCOUNTER — Telehealth: Payer: Self-pay

## 2016-01-14 NOTE — Telephone Encounter (Signed)
Patient did not show to appt today  

## 2016-01-15 ENCOUNTER — Encounter: Payer: Self-pay | Admitting: Adult Health

## 2016-01-24 ENCOUNTER — Encounter (HOSPITAL_COMMUNITY): Payer: Self-pay

## 2016-01-24 ENCOUNTER — Emergency Department (HOSPITAL_COMMUNITY): Payer: Medicare Other

## 2016-01-24 ENCOUNTER — Emergency Department (HOSPITAL_COMMUNITY)
Admission: EM | Admit: 2016-01-24 | Discharge: 2016-01-24 | Disposition: A | Discharge status: Home / Self Care | Payer: Medicare Other | Attending: Emergency Medicine | Admitting: Emergency Medicine

## 2016-01-24 DIAGNOSIS — Z8673 Personal history of transient ischemic attack (TIA), and cerebral infarction without residual deficits: Secondary | ICD-10-CM | POA: Insufficient documentation

## 2016-01-24 DIAGNOSIS — Y939 Activity, unspecified: Secondary | ICD-10-CM | POA: Insufficient documentation

## 2016-01-24 DIAGNOSIS — Z87891 Personal history of nicotine dependence: Secondary | ICD-10-CM | POA: Insufficient documentation

## 2016-01-24 DIAGNOSIS — Y92129 Unspecified place in nursing home as the place of occurrence of the external cause: Secondary | ICD-10-CM | POA: Diagnosis not present

## 2016-01-24 DIAGNOSIS — M25552 Pain in left hip: Secondary | ICD-10-CM | POA: Diagnosis present

## 2016-01-24 DIAGNOSIS — Z7901 Long term (current) use of anticoagulants: Secondary | ICD-10-CM | POA: Diagnosis not present

## 2016-01-24 DIAGNOSIS — W06XXXA Fall from bed, initial encounter: Secondary | ICD-10-CM | POA: Insufficient documentation

## 2016-01-24 DIAGNOSIS — Y999 Unspecified external cause status: Secondary | ICD-10-CM | POA: Insufficient documentation

## 2016-01-24 DIAGNOSIS — W19XXXA Unspecified fall, initial encounter: Secondary | ICD-10-CM

## 2016-01-24 LAB — BASIC METABOLIC PANEL WITH GFR
Anion gap: 6 (ref 5–15)
BUN: 30 mg/dL — ABNORMAL HIGH (ref 6–20)
CO2: 27 mmol/L (ref 22–32)
Calcium: 8.8 mg/dL — ABNORMAL LOW (ref 8.9–10.3)
Chloride: 104 mmol/L (ref 101–111)
Creatinine, Ser: 1.19 mg/dL — ABNORMAL HIGH (ref 0.44–1.00)
GFR calc Af Amer: 49 mL/min — ABNORMAL LOW
GFR calc non Af Amer: 43 mL/min — ABNORMAL LOW
Glucose, Bld: 99 mg/dL (ref 65–99)
Potassium: 5.6 mmol/L — ABNORMAL HIGH (ref 3.5–5.1)
Sodium: 137 mmol/L (ref 135–145)

## 2016-01-24 LAB — CBC WITH DIFFERENTIAL/PLATELET
Basophils Absolute: 0 K/uL (ref 0.0–0.1)
Basophils Relative: 0 %
Eosinophils Absolute: 0.2 K/uL (ref 0.0–0.7)
Eosinophils Relative: 3 %
HCT: 33.4 % — ABNORMAL LOW (ref 36.0–46.0)
Hemoglobin: 11.9 g/dL — ABNORMAL LOW (ref 12.0–15.0)
Lymphocytes Relative: 30 %
Lymphs Abs: 2 K/uL (ref 0.7–4.0)
MCH: 29.8 pg (ref 26.0–34.0)
MCHC: 35.6 g/dL (ref 30.0–36.0)
MCV: 83.5 fL (ref 78.0–100.0)
Monocytes Absolute: 0.7 K/uL (ref 0.1–1.0)
Monocytes Relative: 11 %
Neutro Abs: 3.7 K/uL (ref 1.7–7.7)
Neutrophils Relative %: 56 %
Platelets: 219 K/uL (ref 150–400)
RBC: 4 MIL/uL (ref 3.87–5.11)
RDW: 15 % (ref 11.5–15.5)
WBC: 6.7 K/uL (ref 4.0–10.5)

## 2016-01-24 LAB — TYPE AND SCREEN
ABO/RH(D): B POS
Antibody Screen: NEGATIVE

## 2016-01-24 LAB — PROTIME-INR
INR: 1.25 (ref 0.00–1.49)
Prothrombin Time: 15.9 seconds — ABNORMAL HIGH (ref 11.6–15.2)

## 2016-01-24 LAB — APTT: APTT: 34 s (ref 24–37)

## 2016-01-24 MED ORDER — FENTANYL CITRATE (PF) 100 MCG/2ML IJ SOLN: 50.0000 ug | INTRAMUSCULAR | Status: DC | PRN

## 2016-01-24 NOTE — Discharge Instructions (Signed)

## 2016-01-24 NOTE — ED Provider Notes (Signed)
CSN: [id number]     Arrival date & time 01/24/16  1415 History   First MD Initiated Contact with Patient 01/24/16 1421     Chief Complaint  Patient presents with  . Fall  . Hip Pain     (Consider location/radiation/quality/duration/timing/severity/associated sxs/prior Treatment) HPI  Past Medical History  Diagnosis Date  . Seizures ([place])   . Hypotension   . Pneumonia   . Stroke ([place])   . GI bleed   . Pulmonary embolism ([place])   . Gastrostomy tube in place [facility])   . Memory loss   . TTP (thrombotic thrombocytopenic purpura) (HCC)     Treated with PLEX in 2012 at [facility]  . Acute respiratory failure ([place]) 03/09/2015  . Dilantin toxicity 05/13/2014  . Left lower lobe pneumonia 05/13/2014   Past Surgical History  Procedure Laterality Date  . Appendectomy    . Jejunostomy feeding tube    . Femur im nail Right 08/21/2015    Procedure: INTRAMEDULLARY (IM) RETROGRADE FEMORAL NAILING;  Surgeon: Rod Can, MD;  Location: [facility]S;  Service: Orthopedics;  Laterality: Right;   Family History  Problem Relation Age of Onset  . High blood pressure Mother   . Cancer    . Stroke     Social History  Substance Use Topics  . Smoking status: Former Smoker -- 0.50 packs/day for 15 years  . Smokeless tobacco: Never Used  . Alcohol Use: No   OB History    No data available     Review of Systems    Allergies  Codeine; Lipitor; Aspirin; Hctz; Hydralazine; Reserpine; and Tramadol  Home Medications   Prior to Admission medications   Medication Sig Start Date End Date Taking? Authorizing Provider  acetaminophen (TYLENOL) 325 MG tablet Take 2 tablets (650 mg total) by mouth every 6 (six) hours as needed for mild pain or fever. 08/24/15  Yes Bryson L Stilwell, PA-C  albuterol (PROVENTIL) (2.5 MG/3ML) 0.083% nebulizer solution Take 2.5 mg by nebulization every 2 (two) hours as needed for wheezing or shortness of breath.   Yes Historical Provider, MD  apixaban (ELIQUIS) 2.5 MG TABS tablet Take  1 tablet (2.5 mg total) by mouth 2 (two) times daily. 06/10/15  Yes Nita Sells, MD  Cranberry 250 MG TABS Take 250 mg by mouth daily.   Yes Historical Provider, MD  divalproex (DEPAKOTE) 250 MG DR tablet Take 250 mg by mouth 2 (two) times daily.   Yes Historical Provider, MD  famotidine (PEPCID) 20 MG tablet Take 20 mg by mouth daily.    Yes Historical Provider, MD  ferrous sulfate 325 (65 FE) MG tablet Take 1 tablet (325 mg total) by mouth daily. 07/22/14  Yes Bonnielee Haff, MD  lacosamide (VIMPAT) 200 MG TABS tablet Take 1 tablet (200 mg total) by mouth 2 (two) times daily. 07/24/15  Yes Marcial Pacas, MD  LORazepam (ATIVAN) 2 MG/ML injection Inject 0.5 mLs (1 mg total) into the muscle every 6 (six) hours as needed for seizure. 08/24/15  Yes Modena Jansky, MD  magnesium oxide (MAG-OX) 400 MG tablet Take 1 tablet (400 mg total) by mouth daily. 03/12/15  Yes Marijean Heath, NP  metoprolol tartrate (LOPRESSOR) 25 MG tablet Take 37.5 mg by mouth 2 (two) times daily.    Yes Historical Provider, MD  mirtazapine (REMERON) 15 MG tablet Take 7.5 mg by mouth at bedtime.   Yes Historical Provider, MD  ondansetron (ZOFRAN) 4 MG tablet Take 1 tablet (4 mg total) by mouth  every 4 (four) hours as needed for nausea. 07/22/14  Yes Bonnielee Haff, MD  trimethoprim (TRIMPEX) 100 MG tablet Take 100 mg by mouth daily.    Yes Historical Provider, MD  cefUROXime (CEFTIN) 250 MG tablet Take 1 tablet (250 mg total) by mouth 2 (two) times daily with a meal. Discontinue after 08/26/2015 doses. Patient not taking: Reported on 01/24/2016 08/24/15   Modena Jansky, MD  docusate sodium (COLACE) 100 MG capsule Take 1 capsule (100 mg total) by mouth 2 (two) times daily as needed for mild constipation. Patient not taking: Reported on 01/24/2016 08/24/15   Modena Jansky, MD  feeding supplement (BOOST / RESOURCE BREEZE) LIQD Take 1 Container by mouth 3 (three) times daily between meals. Patient not taking: Reported on  01/24/2016 08/24/15   Modena Jansky, MD  furosemide (LASIX) 40 MG tablet Take 1 tablet (40 mg total) by mouth daily as needed (for leg swelling.  Give PRN potassium when Lasix is given). Patient not taking: Reported on 01/24/2016 07/22/14   Bonnielee Haff, MD  methocarbamol (ROBAXIN) 500 MG tablet Take 1 tablet (500 mg total) by mouth every 8 (eight) hours as needed for muscle spasms. Patient not taking: Reported on 01/24/2016 08/24/15   Bryson L Stilwell, PA-C  polyethylene glycol (MIRALAX / GLYCOLAX) packet Take 17 g by mouth daily as needed for moderate constipation. Patient not taking: Reported on 01/24/2016 08/24/15   Modena Jansky, MD  potassium chloride SA (KLOR-CON M20) 20 MEQ tablet Take 1 tablet (20 mEq total) by mouth daily as needed (whenever she is given Lasix). Patient not taking: Reported on 01/24/2016 07/22/14   Bonnielee Haff, MD   BP 162/79 mmHg  Pulse 73  Temp(Src) 98.3 F (36.8 C) (Oral)  Resp 13  SpO2 100% Physical Exam  ED Course  Procedures (including critical care time) Labs Review Labs Reviewed  BASIC METABOLIC PANEL - Abnormal; Notable for the following:    Potassium 5.6 (*)    BUN 30 (*)    Creatinine, Ser 1.19 (*)    Calcium 8.8 (*)    GFR calc non Af Amer 43 (*)    GFR calc Af Amer 49 (*)    All other components within normal limits  CBC WITH DIFFERENTIAL/PLATELET - Abnormal; Notable for the following:    Hemoglobin 11.9 (*)    HCT 33.4 (*)    All other components within normal limits  PROTIME-INR - Abnormal; Notable for the following:    Prothrombin Time 15.9 (*)    All other components within normal limits  APTT  TYPE AND SCREEN    Imaging Review Ct Head Wo Contrast  01/24/2016  CLINICAL DATA:  [place] out of bed at a nursing home today.  Dementia. EXAM: CT HEAD WITHOUT CONTRAST TECHNIQUE: Contiguous axial images were obtained from the base of the skull through the vertex without intravenous contrast. COMPARISON:  09/24/2015. FINDINGS: An old left  middle cerebral artery distribution infarct is again demonstrated. Diffusely enlarged ventricles and subarachnoid spaces. Patchy white matter low density in both cerebral hemispheres. No skull fracture, intracranial hemorrhage, mass lesion, CT evidence of acute infarction or paranasal sinus air-fluid levels. IMPRESSION: 1. No acute abnormality. 2. Stable atrophy, small vessel white matter ischemic changes and old left middle cerebral artery distribution infarct. Electronically Signed   By: Claudie Revering M.D.   On: 01/24/2016 16:56   Ct Pelvis Wo Contrast  01/24/2016  CLINICAL DATA:  Left hip pain to palpation after falling out of bed  onto the floor at a nursing home today. EXAM: CT PELVIS WITHOUT CONTRAST TECHNIQUE: Multidetector CT imaging of the pelvis was performed following the standard protocol without intravenous contrast. COMPARISON:  08/18/2015. FINDINGS: Interval right hip fixation hardware. Interval almost complete healing of the previously demonstrated right inferior pubic ramus fracture in partial healing of a right superior pubic ramus fracture. No acute fracture or dislocation seen. Diffuse osteopenia. Atheromatous arterial calcifications. Sigmoid colon diverticula. 2.5 x 1.2 cm probable left ovary with central cystic changes on image number 24 series 3, without significant change. Unremarkable uterus and right ovary. IMPRESSION: 1. No acute fracture or dislocation. 2. Interval mile most complete healing of the previously demonstrated right superior and inferior pubic ramus fractures. 3. Interval hardware fixation of the previously described right femur fracture with healing. Electronically Signed   By: Claudie Revering M.D.   On: 01/24/2016 17:03   Dg Hip Unilat With Pelvis 2-3 Views Left  01/24/2016  CLINICAL DATA:  Pt presents via EMS from [facility] home after a fall that occurred today. Pt was rolling out of the bed and fell on the floor. Hx of dementia. C/o pain to left hip. EXAM: DG HIP  (WITH OR WITHOUT PELVIS) 2-3V LEFT COMPARISON:  None. FINDINGS: Study mildly limited due to rotation. Pelvic bones appear to be intact other than chronic appearing deformities of the right rami. Intra medullary nail right femur with deformity of the intertrochanteric region on the right consistent with prior fracture. There is severe joint space narrowing bilaterally. No acute abnormality appreciated on the left. IMPRESSION: No acute findings Electronically Signed   By: Skipper Cliche M.D.   On: 01/24/2016 15:27   I have personally reviewed and evaluated these images and lab results as part of my medical decision-making.   EKG Interpretation None      MDM  PT MOVING ALL 4 EXTREMITIES.  SHE SAID THAT SHE FEELS GOOD.  LABS AND CT OK.  PT OK FOR HOME. Final diagnoses:  Fall, initial encounter       Isla Pence, MD 01/24/16 1712

## 2016-01-24 NOTE — ED Notes (Signed)
Bed: [facility] Expected date:  Expected time:  Means of arrival:  Comments: Fall; hip pain

## 2016-01-24 NOTE — ED Notes (Signed)
RN starting IV, drawing labs 

## 2016-01-24 NOTE — ED Provider Notes (Signed)
CSN: [id number]     Arrival date & time 01/24/16  1415 History   First MD Initiated Contact with Patient 01/24/16 1421     Chief Complaint  Patient presents with  . Fall  . Hip Pain  Level V caveat: Dementia HPI Patient presents to the emergency room for evaluation of left hip pain. The patient's a resident of a nursing facility. According to the EMS report the patient was rolling out of the bed and fell onto the floor. The patient complains of pain in the left hip.   Patient states she was opening a door and fell. She denies headache, neck pain or loss of consciousness. No chest pain or abdominal pain. She is unable to provide any other clear medical history. Past Medical History  Diagnosis Date  . Seizures ([place])   . Hypotension   . Pneumonia   . Stroke ([place])   . GI bleed   . Pulmonary embolism ([place])   . Gastrostomy tube in place [facility])   . Memory loss   . TTP (thrombotic thrombocytopenic purpura) (HCC)     Treated with PLEX in 2012 at [facility]  . Acute respiratory failure ([place]) 03/09/2015  . Dilantin toxicity 05/13/2014  . Left lower lobe pneumonia 05/13/2014   Past Surgical History  Procedure Laterality Date  . Appendectomy    . Jejunostomy feeding tube    . Femur im nail Right 08/21/2015    Procedure: INTRAMEDULLARY (IM) RETROGRADE FEMORAL NAILING;  Surgeon: Rod Can, MD;  Location: [facility];  Service: Orthopedics;  Laterality: Right;   Family History  Problem Relation Age of Onset  . High blood pressure Mother   . Cancer    . Stroke     Social History  Substance Use Topics  . Smoking status: Former Smoker -- 0.50 packs/day for 15 years  . Smokeless tobacco: Never Used  . Alcohol Use: No   OB History    No data available     Review of Systems  All other systems reviewed and are negative.     Allergies  Codeine; Lipitor; Aspirin; Hctz; Hydralazine; Reserpine; and Tramadol  Home Medications   Prior to Admission medications   Medication Sig Start Date End Date  Taking? Authorizing Provider  acetaminophen (TYLENOL) 325 MG tablet Take 2 tablets (650 mg total) by mouth every 6 (six) hours as needed for mild pain or fever. 08/24/15  Yes Bryson L Stilwell, PA-C  albuterol (PROVENTIL) (2.5 MG/3ML) 0.083% nebulizer solution Take 2.5 mg by nebulization every 2 (two) hours as needed for wheezing or shortness of breath.   Yes Historical Provider, MD  apixaban (ELIQUIS) 2.5 MG TABS tablet Take 1 tablet (2.5 mg total) by mouth 2 (two) times daily. 06/10/15  Yes Nita Sells, MD  Cranberry 250 MG TABS Take 250 mg by mouth daily.   Yes Historical Provider, MD  divalproex (DEPAKOTE) 250 MG DR tablet Take 250 mg by mouth 2 (two) times daily.   Yes Historical Provider, MD  famotidine (PEPCID) 20 MG tablet Take 20 mg by mouth daily.    Yes Historical Provider, MD  ferrous sulfate 325 (65 FE) MG tablet Take 1 tablet (325 mg total) by mouth daily. 07/22/14  Yes Bonnielee Haff, MD  lacosamide (VIMPAT) 200 MG TABS tablet Take 1 tablet (200 mg total) by mouth 2 (two) times daily. 07/24/15  Yes Marcial Pacas, MD  LORazepam (ATIVAN) 2 MG/ML injection Inject 0.5 mLs (1 mg total) into the muscle every 6 (six) hours as needed  for seizure. 08/24/15  Yes Modena Jansky, MD  magnesium oxide (MAG-OX) 400 MG tablet Take 1 tablet (400 mg total) by mouth daily. 03/12/15  Yes Marijean Heath, NP  metoprolol tartrate (LOPRESSOR) 25 MG tablet Take 37.5 mg by mouth 2 (two) times daily.    Yes Historical Provider, MD  mirtazapine (REMERON) 15 MG tablet Take 7.5 mg by mouth at bedtime.   Yes Historical Provider, MD  ondansetron (ZOFRAN) 4 MG tablet Take 1 tablet (4 mg total) by mouth every 4 (four) hours as needed for nausea. 07/22/14  Yes Bonnielee Haff, MD  trimethoprim (TRIMPEX) 100 MG tablet Take 100 mg by mouth daily.    Yes Historical Provider, MD  cefUROXime (CEFTIN) 250 MG tablet Take 1 tablet (250 mg total) by mouth 2 (two) times daily with a meal. Discontinue after 08/26/2015  doses. Patient not taking: Reported on 01/24/2016 08/24/15   Modena Jansky, MD  docusate sodium (COLACE) 100 MG capsule Take 1 capsule (100 mg total) by mouth 2 (two) times daily as needed for mild constipation. Patient not taking: Reported on 01/24/2016 08/24/15   Modena Jansky, MD  feeding supplement (BOOST / RESOURCE BREEZE) LIQD Take 1 Container by mouth 3 (three) times daily between meals. Patient not taking: Reported on 01/24/2016 08/24/15   Modena Jansky, MD  furosemide (LASIX) 40 MG tablet Take 1 tablet (40 mg total) by mouth daily as needed (for leg swelling.  Give PRN potassium when Lasix is given). Patient not taking: Reported on 01/24/2016 07/22/14   Bonnielee Haff, MD  methocarbamol (ROBAXIN) 500 MG tablet Take 1 tablet (500 mg total) by mouth every 8 (eight) hours as needed for muscle spasms. Patient not taking: Reported on 01/24/2016 08/24/15   Bryson L Stilwell, PA-C  polyethylene glycol (MIRALAX / GLYCOLAX) packet Take 17 g by mouth daily as needed for moderate constipation. Patient not taking: Reported on 01/24/2016 08/24/15   Modena Jansky, MD  potassium chloride SA (KLOR-CON M20) 20 MEQ tablet Take 1 tablet (20 mEq total) by mouth daily as needed (whenever she is given Lasix). Patient not taking: Reported on 01/24/2016 07/22/14   Bonnielee Haff, MD   BP 162/79 mmHg  Pulse 73  Temp(Src) 98.3 F (36.8 C) (Oral)  Resp 13  SpO2 100% Physical Exam  Constitutional: She appears well-developed and well-nourished. No distress.  HENT:  Head: Normocephalic.  Right Ear: External ear normal.  Left Ear: External ear normal.  No cephalhematoma  Eyes: Conjunctivae are normal. Right eye exhibits no discharge. Left eye exhibits no discharge. No scleral icterus.  Neck: Neck supple. No tracheal deviation present.  No cervical spine tenderness  Cardiovascular: Normal rate, regular rhythm and intact distal pulses.   Pulmonary/Chest: Effort normal and breath sounds normal. No stridor. No  respiratory distress. She has no wheezes. She has no rales.  Abdominal: Soft. Bowel sounds are normal. She exhibits no distension. There is no tenderness. There is no rebound and no guarding.  Musculoskeletal: She exhibits tenderness. She exhibits no edema.       Left hip: She exhibits tenderness and bony tenderness. She exhibits no swelling and no deformity.  Right leg appears shorter than left, tenderness to palpation on the left hip and pain with range of motion, distal neurovascular intact  Neurological: She is alert. She displays no tremor. No cranial nerve deficit (no facial droop, extraocular movements intact, no slurred speech although she has repetitive speech (repeats the word door)) or sensory deficit. She exhibits  normal muscle tone. She displays no seizure activity.  Skin: Skin is warm and dry. No rash noted.  Psychiatric: She has a normal mood and affect.  Nursing note and vitals reviewed.   ED Course  Procedures (including critical care time) Labs Review Labs Reviewed  BASIC METABOLIC PANEL  CBC WITH DIFFERENTIAL/PLATELET  PROTIME-INR  APTT  TYPE AND SCREEN    Imaging Review Dg Hip Unilat With Pelvis 2-3 Views Left  01/24/2016  CLINICAL DATA:  Pt presents via EMS from [place] home after a fall that occurred today. Pt was rolling out of the bed and fell on the floor. Hx of dementia. C/o pain to left hip. EXAM: DG HIP (WITH OR WITHOUT PELVIS) 2-3V LEFT COMPARISON:  None. FINDINGS: Study mildly limited due to rotation. Pelvic bones appear to be intact other than chronic appearing deformities of the right rami. Intra medullary nail right femur with deformity of the intertrochanteric region on the right consistent with prior fracture. There is severe joint space narrowing bilaterally. No acute abnormality appreciated on the left. IMPRESSION: No acute findings Electronically Signed   By: Skipper Cliche M.D.   On: 01/24/2016 15:27   I have personally reviewed and evaluated  these images and lab results as part of my medical decision-making.   MDM   Initial xrays do not show any fracture or acute injury.  Will CT to evaluate further although the history does not suggest a serious fall.  History is limited by her dementia.  Will turn over case to Dr Gilford Raid.   Dorie Rank, MD 01/24/16 [phone]

## 2016-01-24 NOTE — ED Notes (Signed)
Pt presents via EMS from [place] home after a fall that occurred today. Pt was rolling out of the bed and fell on the floor. Hx of dementia. Pt does not have any complaints but on palpation has pain to the left hip. Hx of hip fractures as well.

## 2017-07-25 ENCOUNTER — Emergency Department (HOSPITAL_COMMUNITY): Payer: Medicare Other

## 2017-07-25 ENCOUNTER — Other Ambulatory Visit: Payer: Self-pay

## 2017-07-25 ENCOUNTER — Encounter (HOSPITAL_COMMUNITY): Payer: Self-pay

## 2017-07-25 ENCOUNTER — Inpatient Hospital Stay (HOSPITAL_COMMUNITY): Payer: Medicare Other

## 2017-07-25 ENCOUNTER — Inpatient Hospital Stay (HOSPITAL_COMMUNITY)
Admission: EM | Admit: 2017-07-25 | Discharge: 2017-07-30 | DRG: 871 | Disposition: A | Discharge status: Skilled Nursing Facility | Payer: Medicare Other | Attending: Family Medicine | Admitting: Family Medicine

## 2017-07-25 DIAGNOSIS — Z885 Allergy status to narcotic agent status: Secondary | ICD-10-CM

## 2017-07-25 DIAGNOSIS — E875 Hyperkalemia: Secondary | ICD-10-CM | POA: Diagnosis present

## 2017-07-25 DIAGNOSIS — Z79899 Other long term (current) drug therapy: Secondary | ICD-10-CM

## 2017-07-25 DIAGNOSIS — G40909 Epilepsy, unspecified, not intractable, without status epilepticus: Secondary | ICD-10-CM | POA: Diagnosis present

## 2017-07-25 DIAGNOSIS — Z8249 Family history of ischemic heart disease and other diseases of the circulatory system: Secondary | ICD-10-CM

## 2017-07-25 DIAGNOSIS — N390 Urinary tract infection, site not specified: Secondary | ICD-10-CM | POA: Diagnosis present

## 2017-07-25 DIAGNOSIS — B9620 Unspecified Escherichia coli [E. coli] as the cause of diseases classified elsewhere: Secondary | ICD-10-CM | POA: Diagnosis present

## 2017-07-25 DIAGNOSIS — Z87891 Personal history of nicotine dependence: Secondary | ICD-10-CM

## 2017-07-25 DIAGNOSIS — M311 Thrombotic microangiopathy: Secondary | ICD-10-CM | POA: Diagnosis present

## 2017-07-25 DIAGNOSIS — I6930 Unspecified sequelae of cerebral infarction: Secondary | ICD-10-CM | POA: Diagnosis not present

## 2017-07-25 DIAGNOSIS — E870 Hyperosmolality and hypernatremia: Secondary | ICD-10-CM | POA: Diagnosis present

## 2017-07-25 DIAGNOSIS — D696 Thrombocytopenia, unspecified: Secondary | ICD-10-CM | POA: Diagnosis present

## 2017-07-25 DIAGNOSIS — G9341 Metabolic encephalopathy: Secondary | ICD-10-CM | POA: Diagnosis present

## 2017-07-25 DIAGNOSIS — I959 Hypotension, unspecified: Secondary | ICD-10-CM | POA: Diagnosis present

## 2017-07-25 DIAGNOSIS — R0902 Hypoxemia: Secondary | ICD-10-CM | POA: Diagnosis present

## 2017-07-25 DIAGNOSIS — Z862 Personal history of diseases of the blood and blood-forming organs and certain disorders involving the immune mechanism: Secondary | ICD-10-CM | POA: Diagnosis not present

## 2017-07-25 DIAGNOSIS — R413 Other amnesia: Secondary | ICD-10-CM | POA: Diagnosis not present

## 2017-07-25 DIAGNOSIS — Z66 Do not resuscitate: Secondary | ICD-10-CM | POA: Diagnosis present

## 2017-07-25 DIAGNOSIS — Z86711 Personal history of pulmonary embolism: Secondary | ICD-10-CM | POA: Diagnosis not present

## 2017-07-25 DIAGNOSIS — Z8673 Personal history of transient ischemic attack (TIA), and cerebral infarction without residual deficits: Secondary | ICD-10-CM

## 2017-07-25 DIAGNOSIS — I10 Essential (primary) hypertension: Secondary | ICD-10-CM | POA: Diagnosis present

## 2017-07-25 DIAGNOSIS — Z23 Encounter for immunization: Secondary | ICD-10-CM | POA: Diagnosis not present

## 2017-07-25 DIAGNOSIS — Z886 Allergy status to analgesic agent status: Secondary | ICD-10-CM | POA: Diagnosis not present

## 2017-07-25 DIAGNOSIS — R569 Unspecified convulsions: Secondary | ICD-10-CM | POA: Diagnosis not present

## 2017-07-25 DIAGNOSIS — Z888 Allergy status to other drugs, medicaments and biological substances status: Secondary | ICD-10-CM

## 2017-07-25 DIAGNOSIS — Z7901 Long term (current) use of anticoagulants: Secondary | ICD-10-CM

## 2017-07-25 DIAGNOSIS — I2699 Other pulmonary embolism without acute cor pulmonale: Secondary | ICD-10-CM | POA: Diagnosis not present

## 2017-07-25 DIAGNOSIS — A419 Sepsis, unspecified organism: Secondary | ICD-10-CM | POA: Diagnosis not present

## 2017-07-25 DIAGNOSIS — N179 Acute kidney failure, unspecified: Secondary | ICD-10-CM | POA: Diagnosis present

## 2017-07-25 DIAGNOSIS — A4151 Sepsis due to Escherichia coli [E. coli]: Principal | ICD-10-CM | POA: Diagnosis present

## 2017-07-25 LAB — COMPREHENSIVE METABOLIC PANEL
ALK PHOS: 48 U/L (ref 38–126)
ALT: 10 U/L — AB (ref 14–54)
AST: 27 U/L (ref 15–41)
Albumin: 3.7 g/dL (ref 3.5–5.0)
Anion gap: 9 (ref 5–15)
BILIRUBIN TOTAL: 0.6 mg/dL (ref 0.3–1.2)
BUN: 26 mg/dL — AB (ref 6–20)
CHLORIDE: 112 mmol/L — AB (ref 101–111)
CO2: 25 mmol/L (ref 22–32)
CREATININE: 1.6 mg/dL — AB (ref 0.44–1.00)
Calcium: 9 mg/dL (ref 8.9–10.3)
GFR calc Af Amer: 34 mL/min — ABNORMAL LOW (ref >60)
GFR calc non Af Amer: 30 mL/min — ABNORMAL LOW (ref >60)
Glucose, Bld: 90 mg/dL (ref 65–99)
Potassium: 4.5 mmol/L (ref 3.5–5.1)
Sodium: 146 mmol/L — ABNORMAL HIGH (ref 135–145)
TOTAL PROTEIN: 6.7 g/dL (ref 6.5–8.1)

## 2017-07-25 LAB — CBC WITH DIFFERENTIAL/PLATELET
Basophils Absolute: 0 10*3/uL (ref 0.0–0.1)
Basophils Relative: 0 %
EOS ABS: 0 10*3/uL (ref 0.0–0.7)
EOS PCT: 0 %
HCT: 33.5 % — ABNORMAL LOW (ref 36.0–46.0)
HEMOGLOBIN: 11.6 g/dL — AB (ref 12.0–15.0)
LYMPHS ABS: 0.9 10*3/uL (ref 0.7–4.0)
Lymphocytes Relative: 7 %
MCH: 30.6 pg (ref 26.0–34.0)
MCHC: 34.6 g/dL (ref 30.0–36.0)
MCV: 88.4 fL (ref 78.0–100.0)
MONOS PCT: 5 %
Monocytes Absolute: 0.6 10*3/uL (ref 0.1–1.0)
Neutro Abs: 11.4 10*3/uL — ABNORMAL HIGH (ref 1.7–7.7)
Neutrophils Relative %: 88 %
PLATELETS: 161 10*3/uL (ref 150–400)
RBC: 3.79 MIL/uL — ABNORMAL LOW (ref 3.87–5.11)
RDW: 15.2 % (ref 11.5–15.5)
WBC: 12.9 10*3/uL — AB (ref 4.0–10.5)

## 2017-07-25 LAB — URINALYSIS, ROUTINE W REFLEX MICROSCOPIC
BILIRUBIN URINE: NEGATIVE
Glucose, UA: NEGATIVE mg/dL
HGB URINE DIPSTICK: NEGATIVE
KETONES UR: NEGATIVE mg/dL
Nitrite: POSITIVE — AB
PROTEIN: 100 mg/dL — AB
SQUAMOUS EPITHELIAL / LPF: NONE SEEN
Specific Gravity, Urine: 1.013 (ref 1.005–1.030)
pH: 6 (ref 5.0–8.0)

## 2017-07-25 LAB — MRSA PCR SCREENING: MRSA BY PCR: POSITIVE — AB

## 2017-07-25 LAB — VALPROIC ACID LEVEL: VALPROIC ACID LVL: 31 ug/mL — AB (ref 50.0–100.0)

## 2017-07-25 LAB — I-STAT CG4 LACTIC ACID, ED
Lactic Acid, Venous: 3.16 mmol/L (ref 0.5–1.9)
Lactic Acid, Venous: 2.78 mmol/L (ref 0.5–1.9)

## 2017-07-25 MED ORDER — CHLORHEXIDINE GLUCONATE CLOTH 2 % EX PADS
6.0000 | MEDICATED_PAD | Freq: Every day | CUTANEOUS | Status: DC
  Administered 2017-07-26 – 2017-07-29 (×3): 6 via TOPICAL

## 2017-07-25 MED ORDER — SODIUM CHLORIDE 0.9 % IV SOLN
200.0000 mg | Freq: Once | INTRAVENOUS | Status: AC
  Administered 2017-07-25: 200 mg via INTRAVENOUS
  Filled 2017-07-25: qty 20

## 2017-07-25 MED ORDER — ORAL CARE MOUTH RINSE
15.0000 mL | Freq: Two times a day (BID) | OROMUCOSAL | Status: DC
  Administered 2017-07-26 – 2017-07-29 (×4): 15 mL via OROMUCOSAL

## 2017-07-25 MED ORDER — VANCOMYCIN HCL IN DEXTROSE 1-5 GM/200ML-% IV SOLN
1000.0000 mg | Freq: Once | INTRAVENOUS | Status: AC
  Administered 2017-07-25: 1000 mg via INTRAVENOUS
  Filled 2017-07-25: qty 200

## 2017-07-25 MED ORDER — SODIUM CHLORIDE 0.9 % IV SOLN
200.0000 mg | Freq: Two times a day (BID) | INTRAVENOUS | Status: DC
  Administered 2017-07-25 – 2017-07-26 (×3): 200 mg via INTRAVENOUS
  Filled 2017-07-25 (×4): qty 20

## 2017-07-25 MED ORDER — ACETAMINOPHEN 650 MG RE SUPP: 650.0000 mg | Freq: Four times a day (QID) | RECTAL | Status: DC | PRN

## 2017-07-25 MED ORDER — CHLORHEXIDINE GLUCONATE 0.12 % MT SOLN
15.0000 mL | Freq: Two times a day (BID) | OROMUCOSAL | Status: DC
  Administered 2017-07-26 – 2017-07-29 (×4): 15 mL via OROMUCOSAL
  Filled 2017-07-25 (×3): qty 15

## 2017-07-25 MED ORDER — SODIUM CHLORIDE 0.9 % IV SOLN
INTRAVENOUS | Status: DC
  Administered 2017-07-25 – 2017-07-29 (×7): via INTRAVENOUS

## 2017-07-25 MED ORDER — CEFTRIAXONE SODIUM 1 G IJ SOLR
1.0000 g | INTRAMUSCULAR | Status: DC
Start: 2017-07-25 — End: 2017-07-29
  Administered 2017-07-25 – 2017-07-29 (×5): 1 g via INTRAVENOUS
  Filled 2017-07-25 (×5): qty 10

## 2017-07-25 MED ORDER — ACETAMINOPHEN 325 MG PO TABS: 650.0000 mg | ORAL_TABLET | Freq: Four times a day (QID) | ORAL | Status: DC | PRN

## 2017-07-25 MED ORDER — SODIUM CHLORIDE 0.9 % IV BOLUS (SEPSIS)
1000.0000 mL | Freq: Once | INTRAVENOUS | Status: AC
  Administered 2017-07-25: 1000 mL via INTRAVENOUS

## 2017-07-25 MED ORDER — PIPERACILLIN-TAZOBACTAM 3.375 G IVPB 30 MIN
3.3750 g | Freq: Once | INTRAVENOUS | Status: AC
  Administered 2017-07-25: 3.375 g via INTRAVENOUS
  Filled 2017-07-25: qty 50

## 2017-07-25 MED ORDER — MUPIROCIN 2 % EX OINT
TOPICAL_OINTMENT | CUTANEOUS | Status: AC
  Administered 2017-07-26: 1 via NASAL
  Filled 2017-07-25: qty 22

## 2017-07-25 MED ORDER — LORAZEPAM 2 MG/ML IJ SOLN: 2.0000 mg | INTRAMUSCULAR | Status: DC | PRN

## 2017-07-25 MED ORDER — METOPROLOL TARTRATE 5 MG/5ML IV SOLN
5.0000 mg | Freq: Four times a day (QID) | INTRAVENOUS | Status: DC
  Administered 2017-07-25 – 2017-07-30 (×17): 5 mg via INTRAVENOUS
  Filled 2017-07-25 (×17): qty 5

## 2017-07-25 MED ORDER — MUPIROCIN 2 % EX OINT
1.0000 "application " | TOPICAL_OINTMENT | Freq: Two times a day (BID) | CUTANEOUS | Status: AC
  Administered 2017-07-26 – 2017-07-30 (×10): 1 via NASAL

## 2017-07-25 MED ORDER — SODIUM CHLORIDE 0.9 % IV BOLUS (SEPSIS)
500.0000 mL | Freq: Once | INTRAVENOUS | Status: AC
  Administered 2017-07-25: 500 mL via INTRAVENOUS

## 2017-07-25 MED ORDER — VALPROATE SODIUM 500 MG/5ML IV SOLN
250.0000 mg | Freq: Two times a day (BID) | INTRAVENOUS | Status: DC
  Administered 2017-07-25 – 2017-07-26 (×3): 250 mg via INTRAVENOUS
  Filled 2017-07-25 (×4): qty 2.5

## 2017-07-25 NOTE — ED Notes (Signed)
Charge Erline Levine RN and [employer] RN present to assist with care for this pt.

## 2017-07-25 NOTE — ED Notes (Signed)
Bed: [facility] Expected date:  Expected time:  Means of arrival:  Comments: EMS-SZ/Dementia

## 2017-07-25 NOTE — Progress Notes (Signed)
CRITICAL VALUE ALERT  Critical Value:  MRSA positive  Date & Time Notied:  07/25/17 21:05  Provider Notified:   Orders Received/Actions taken: Standing orders placed

## 2017-07-25 NOTE — ED Provider Notes (Signed)
[facility] DEPT Provider Note   CSN: [id number] Arrival date & time: 07/25/17  1046    Level 5 caveat: Altered mental status History   Chief Complaint Chief Complaint  Patient presents with  . Seizures  . Hypotension  . LOW OXYGEN    HPI Tanya White is a 81 y.o. female.  HPI Patient presents to the emergency room for evaluation of altered mental status, seizures.  Patient is a resident of a nursing facility.  She has a history of dementia.  According to the EMS report the patient had 15 witnessed seizures today she was also noted to be febrile.  EMS noted that she was hypotensive and hypoxic.  Patient is unable to answer any questions. Past Medical History:  Diagnosis Date  . Acute respiratory failure ([place]) 03/09/2015  . Dilantin toxicity 05/13/2014  . Gastrostomy tube in place [facility])   . GI bleed   . Hypotension   . Left lower lobe pneumonia ([place]) 05/13/2014  . Memory loss   . Pneumonia   . Pulmonary embolism ([place])   . Seizures ([place])   . Stroke ([place])   . TTP (thrombotic thrombocytopenic purpura) (HCC)    Treated with PLEX in 2012 at [facility]    Patient Active Problem List   Diagnosis Date Noted  . Sepsis secondary to UTI ([place]) 07/25/2017  . History of TTP (thrombotic thrombocytopenic purpura) 07/25/2017  . Fracture, intertrochanteric, right femur ([place]) 08/21/2015  . Urinary tract infection due to Proteus 08/20/2015  . Pelvic ring fracture ([place])   . Closed right hip fracture ([place]) 08/18/2015  . H/O: stroke with residual effects 08/18/2015  . AKI (acute kidney injury) ([place]) 08/18/2015  . Fracture of multiple pubic rami ([place]) 08/18/2015  . Aortic atherosclerosis ([place]) 08/18/2015  . Fall 08/18/2015  . Seizures ([place]) 03/09/2015  . Dysphasia 07/16/2014  . Pulmonary embolism ([place])   . Gastrostomy tube in place [facility])   . Memory loss     Past Surgical History:  Procedure Laterality Date  . APPENDECTOMY    . FEMUR IM NAIL Right 08/21/2015    Procedure: INTRAMEDULLARY (IM) RETROGRADE FEMORAL NAILING;  Surgeon: Rod Can, MD;  Location: [facility] ORS;  Service: Orthopedics;  Laterality: Right;  . JEJUNOSTOMY FEEDING TUBE      OB History    No data available       Home Medications    Prior to Admission medications   Medication Sig Start Date End Date Taking? Authorizing Provider  acetaminophen (TYLENOL) 325 MG tablet Take 2 tablets (650 mg total) by mouth every 6 (six) hours as needed for mild pain or fever. 08/24/15   Stilwell, Bryson L, PA-C  albuterol (PROVENTIL) (2.5 MG/3ML) 0.083% nebulizer solution Take 2.5 mg by nebulization every 2 (two) hours as needed for wheezing or shortness of breath.    [provider]  apixaban (ELIQUIS) 2.5 MG TABS tablet Take 1 tablet (2.5 mg total) by mouth 2 (two) times daily. 06/10/15   Nita Sells, MD  cefUROXime (CEFTIN) 250 MG tablet Take 1 tablet (250 mg total) by mouth 2 (two) times daily with a meal. Discontinue after 08/26/2015 doses. Patient not taking: Reported on 01/24/2016 08/24/15   Modena Jansky, MD  Cranberry 250 MG TABS Take 250 mg by mouth daily.    [provider]  divalproex (DEPAKOTE) 250 MG DR tablet Take 250 mg by mouth 2 (two) times daily.    [provider]  docusate sodium (COLACE) 100 MG capsule Take 1  capsule (100 mg total) by mouth 2 (two) times daily as needed for mild constipation. Patient not taking: Reported on 01/24/2016 08/24/15   Modena Jansky, MD  famotidine (PEPCID) 20 MG tablet Take 20 mg by mouth daily.     [provider]  feeding supplement (BOOST / RESOURCE BREEZE) LIQD Take 1 Container by mouth 3 (three) times daily between meals. Patient not taking: Reported on 01/24/2016 08/24/15   Modena Jansky, MD  ferrous sulfate 325 (65 FE) MG tablet Take 1 tablet (325 mg total) by mouth daily. 07/22/14   Bonnielee Haff, MD  furosemide (LASIX) 40 MG tablet Take 1 tablet (40 mg total) by mouth daily as needed (for leg  swelling.  Give PRN potassium when Lasix is given). Patient not taking: Reported on 01/24/2016 07/22/14   Bonnielee Haff, MD  lacosamide (VIMPAT) 200 MG TABS tablet Take 1 tablet (200 mg total) by mouth 2 (two) times daily. 07/24/15   Marcial Pacas, MD  LORazepam (ATIVAN) 2 MG/ML injection Inject 0.5 mLs (1 mg total) into the muscle every 6 (six) hours as needed for seizure. 08/24/15   Hongalgi, Lenis Dickinson, MD  magnesium oxide (MAG-OX) 400 MG tablet Take 1 tablet (400 mg total) by mouth daily. 03/12/15   Whiteheart, Cristal Ford, NP  methocarbamol (ROBAXIN) 500 MG tablet Take 1 tablet (500 mg total) by mouth every 8 (eight) hours as needed for muscle spasms. Patient not taking: Reported on 01/24/2016 08/24/15   Wyatt Portela L, PA-C  metoprolol tartrate (LOPRESSOR) 25 MG tablet Take 37.5 mg by mouth 2 (two) times daily.     [provider]  mirtazapine (REMERON) 15 MG tablet Take 7.5 mg by mouth at bedtime.    [provider]  ondansetron (ZOFRAN) 4 MG tablet Take 1 tablet (4 mg total) by mouth every 4 (four) hours as needed for nausea. 07/22/14   Bonnielee Haff, MD  polyethylene glycol [facility] / Floria Raveling) packet Take 17 g by mouth daily as needed for moderate constipation. Patient not taking: Reported on 01/24/2016 08/24/15   Modena Jansky, MD  potassium chloride SA (KLOR-CON M20) 20 MEQ tablet Take 1 tablet (20 mEq total) by mouth daily as needed (whenever she is given Lasix). Patient not taking: Reported on 01/24/2016 07/22/14   Bonnielee Haff, MD  trimethoprim (TRIMPEX) 100 MG tablet Take 100 mg by mouth daily.     [provider]    Family History Family History  Problem Relation Age of Onset  . High blood pressure Mother   . Cancer Unknown   . Stroke Unknown     Social History Social History   Tobacco Use  . Smoking status: Former Smoker    Packs/day: 0.50    Years: 15.00    Pack years: 7.50  . Smokeless tobacco: Never Used  Substance Use Topics  . Alcohol use:  No    Alcohol/week: 0.0 oz  . Drug use: No     Allergies   Codeine; Lipitor [atorvastatin]; Aspirin; Hctz [hydrochlorothiazide]; Hydralazine; Reserpine; and Tramadol   Review of Systems Review of Systems  All other systems reviewed and are negative.    Physical Exam Updated Vital Signs BP (!) 107/54   Pulse 81   Temp 100.2 F (37.9 C) (Rectal)   Resp 15   Ht 1.524 m (5')   Wt 46 kg (101 lb 8 oz)   SpO2 99%   BMI 19.82 kg/m   Physical Exam  Constitutional:  Elderly, frail  HENT:  Head: Normocephalic and atraumatic.  Right Ear: External ear normal.  Left Ear: External ear normal.  Mucous membranes are dry  Eyes: Conjunctivae are normal. Right eye exhibits no discharge. Left eye exhibits no discharge. No scleral icterus.  Neck: Neck supple. No tracheal deviation present.  Cardiovascular: Normal rate, regular rhythm and intact distal pulses.  Pulmonary/Chest: Effort normal and breath sounds normal. No stridor. No respiratory distress. She has no wheezes. She has no rales.  Abdominal: Soft. Bowel sounds are normal. She exhibits no distension. There is no tenderness. There is no rebound and no guarding.  Musculoskeletal: She exhibits no edema or tenderness.  Neurological: She is alert. She has normal strength. No cranial nerve deficit (no facial droop, she is unable to comply with exam) or sensory deficit. She exhibits abnormal muscle tone. She displays no seizure activity.  Contractures, the patient lives with her legs flexed at the knee and her hip is rotated to the right, she appears to have some contractures of her right upper extremity  Skin: Skin is warm and dry. No rash noted.  Psychiatric: She has a normal mood and affect.  Nursing note and vitals reviewed.    ED Treatments / Results  Labs (all labs ordered are listed, but only abnormal results are displayed) Labs Reviewed  COMPREHENSIVE METABOLIC PANEL - Abnormal; Notable for the following components:       Result Value   Sodium 146 (*)    Chloride 112 (*)    BUN 26 (*)    Creatinine, Ser 1.60 (*)    ALT 10 (*)    GFR calc non Af Amer 30 (*)    GFR calc Af Amer 34 (*)    All other components within normal limits  CBC WITH DIFFERENTIAL/PLATELET - Abnormal; Notable for the following components:   WBC 12.9 (*)    RBC 3.79 (*)    Hemoglobin 11.6 (*)    HCT 33.5 (*)    Neutro Abs 11.4 (*)    All other components within normal limits  URINALYSIS, ROUTINE W REFLEX MICROSCOPIC - Abnormal; Notable for the following components:   APPearance HAZY (*)    Protein, ur 100 (*)    Nitrite POSITIVE (*)    Leukocytes, UA LARGE (*)    Bacteria, UA MANY (*)    All other components within normal limits  VALPROIC ACID LEVEL - Abnormal; Notable for the following components:   Valproic Acid Lvl 31 (*)    All other components within normal limits  I-STAT CG4 LACTIC ACID, ED - Abnormal; Notable for the following components:   Lactic Acid, Venous 3.16 (*)    All other components within normal limits  CULTURE, BLOOD (ROUTINE X 2)  CULTURE, BLOOD (ROUTINE X 2)  URINE CULTURE  I-STAT CG4 LACTIC ACID, ED    EKG  EKG Interpretation  Date/Time:  Monday July 25 2017 10:55:43 EST Ventricular Rate:  124 PR Interval:    QRS Duration: 85 QT Interval:  312 QTC Calculation: 449 R Axis:   -5 Text Interpretation:  Sinus tachycardia Low voltage, extremity leads Since last tracing rate faster Confirmed by Dorie Rank [phone]) on 07/25/2017 11:17:59 AM       Radiology Dg Chest Port 1 View  Result Date: 07/25/2017 CLINICAL DATA:  Seizures, weakness, fever EXAM: PORTABLE CHEST 1 VIEW COMPARISON:  08/18/2015 FINDINGS: There is no focal parenchymal opacity. There is no pleural effusion or pneumothorax. The heart and mediastinal contours are unremarkable. There is old posttraumatic deformity of  the right proximal humerus. IMPRESSION: No active disease. Electronically Signed   By: Kathreen Devoid   On: 07/25/2017  11:32    Procedures .Critical Care Performed by: Dorie Rank, MD Authorized by: Dorie Rank, MD   Critical care provider statement:    Critical care time (minutes):  30   Critical care was time spent personally by me on the following activities:  Discussions with consultants, evaluation of patient's response to treatment, examination of patient, ordering and performing treatments and interventions, ordering and review of laboratory studies, ordering and review of radiographic studies, pulse oximetry, re-evaluation of patient's condition, obtaining history from patient or surrogate and review of old charts   (including critical care time)  Medications Ordered in ED Medications  sodium chloride 0.9 % bolus 1,000 mL (0 mLs Intravenous Stopped 07/25/17 1333)    And  sodium chloride 0.9 % bolus 500 mL (0 mLs Intravenous Stopped 07/25/17 1333)  piperacillin-tazobactam (ZOSYN) IVPB 3.375 g (0 g Intravenous Stopped 07/25/17 1255)  vancomycin (VANCOCIN) IVPB 1000 mg/200 mL premix (0 mg Intravenous Stopped 07/25/17 1255)  lacosamide (VIMPAT) 200 mg in sodium chloride 0.9 % 25 mL IVPB (0 mg Intravenous Stopped 07/25/17 1255)     Initial Impression / Assessment and Plan / ED Course  I have reviewed the triage vital signs and the nursing notes.  Pertinent labs & imaging results that were available during my care of the patient were reviewed by me and considered in my medical decision making (see chart for details).  Clinical Course as of Jul 25 1434  Mon Jul 25, 2017  1116 EKG 12-Lead [JK]  1304 Patient presented to the emergency room for evaluation of seizure.  UA  consistent with UTI.  Vitals improving.  Most recent BP above 967E systolic.  Pt is on oxygen now.  No pna on xray.  Will need to monitor for fluid overload.  [LF]  8101 Nursing home paperwork reviewed. Pt is DNR  [JK]    Clinical Course User Index [JK] Dorie Rank, MD    Patient presented to the emergency room for altered mental  status and history and multiple seizures prior to arriving to the emergency room.  Patient has a history of dementia and seizure disorder.  In the ED, no seizure activity was observed however the patient does appears somewhat listless and lethargic.  Pt was treated empirically for sepsis.  Labs do show a UTI.  No further seizure activity in the ED.  Treated for sepsis.  Started on additional dose of her seizure medications.  Vitals improved with treatment. Admit to the hospital for further treatment.  Final Clinical Impressions(s) / ED Diagnoses   Final diagnoses:  Sepsis, due to unspecified organism [facility]  Urinary tract infection without hematuria, site unspecified  Seizure disorder [facility])    ED Discharge Orders    None       Dorie Rank, MD 07/25/17 1435

## 2017-07-25 NOTE — H&P (Signed)
History and Physical    Tanya White ZGY:[id number] DOB: 02-25-1936 DOA: 07/25/2017  PCP: [name], MD  Patient coming from: Nursing home  Chief Complaint: Seizures  HPI: Tanya White is a 81 y.o. female with medical history significant of seizure disorder, history of pulmonary embolism, TTP, prior stroke who presents to the emergency department from nursing facility with witnessed pain seizure episodes.  Subsequent presented to the emergency department  ED Course: Department, patient noted to have urinalysis suggestive of UTI.  Was started on Vimpat IV.  When seen, patient remained confused and unable to provide history.  Hospitalist service consulted for consideration for admission  Review of Systems:  Review of Systems  Unable to perform ROS: Mental acuity    Past Medical History:  Diagnosis Date  . Acute respiratory failure ([place]) 03/09/2015  . Dilantin toxicity 05/13/2014  . Gastrostomy tube in place [facility])   . GI bleed   . Hypotension   . Left lower lobe pneumonia ([place]) 05/13/2014  . Memory loss   . Pneumonia   . Pulmonary embolism ([place])   . Seizures ([place])   . Stroke ([place])   . TTP (thrombotic thrombocytopenic purpura) (HCC)    Treated with PLEX in 2012 at [facility]    Past Surgical History:  Procedure Laterality Date  . APPENDECTOMY    . FEMUR IM NAIL Right 08/21/2015   Procedure: INTRAMEDULLARY (IM) RETROGRADE FEMORAL NAILING;  Surgeon: Rod Can, MD;  Location: [facility] ORS;  Service: Orthopedics;  Laterality: Right;  . JEJUNOSTOMY FEEDING TUBE       reports that she has quit smoking. She has a 7.50 pack-year smoking history. she has never used smokeless tobacco. She reports that she does not drink alcohol or use drugs.  Allergies  Allergen Reactions  . Codeine Shortness Of Breath  . Lipitor [Atorvastatin] Diarrhea and Other (See Comments)    GI upset, rhabdomyalosis   . Aspirin Diarrhea and Other (See Comments)    Only ASA 325mg  ; pt can tolerate low dose  asa 81mg  Reaction : GI upset   . Hctz [Hydrochlorothiazide] Rash  . Hydralazine Other (See Comments)    Unknown reaction - listed on [facility] 06/08/2015  . Reserpine Other (See Comments)    Unknown reaction - listed on [facility] 06/08/2015  . Tramadol Other (See Comments)    Unknown reaction - listed on [facility] 06/08/2015    Family History  Problem Relation Age of Onset  . High blood pressure Mother   . Cancer Unknown   . Stroke Unknown     Prior to Admission medications   Medication Sig Start Date End Date Taking? Authorizing Provider  acetaminophen (TYLENOL) 325 MG tablet Take 2 tablets (650 mg total) by mouth every 6 (six) hours as needed for mild pain or fever. 08/24/15   Stilwell, Bryson L, PA-C  albuterol (PROVENTIL) (2.5 MG/3ML) 0.083% nebulizer solution Take 2.5 mg by nebulization every 2 (two) hours as needed for wheezing or shortness of breath.    [provider]  apixaban (ELIQUIS) 2.5 MG TABS tablet Take 1 tablet (2.5 mg total) by mouth 2 (two) times daily. 06/10/15   Nita Sells, MD  cefUROXime (CEFTIN) 250 MG tablet Take 1 tablet (250 mg total) by mouth 2 (two) times daily with a meal. Discontinue after 08/26/2015 doses. Patient not taking: Reported on 01/24/2016 08/24/15   Modena Jansky, MD  Cranberry 250 MG TABS Take 250 mg by mouth daily.    [provider]  divalproex (DEPAKOTE) 250 MG  DR tablet Take 250 mg by mouth 2 (two) times daily.    [provider]  docusate sodium (COLACE) 100 MG capsule Take 1 capsule (100 mg total) by mouth 2 (two) times daily as needed for mild constipation. Patient not taking: Reported on 01/24/2016 08/24/15   Modena Jansky, MD  famotidine (PEPCID) 20 MG tablet Take 20 mg by mouth daily.     [provider]  feeding supplement (BOOST / RESOURCE BREEZE) LIQD Take 1 Container by mouth 3 (three) times daily between meals. Patient not taking: Reported on 01/24/2016 08/24/15   Modena Jansky, MD  ferrous sulfate  325 (65 FE) MG tablet Take 1 tablet (325 mg total) by mouth daily. 07/22/14   Bonnielee Haff, MD  furosemide (LASIX) 40 MG tablet Take 1 tablet (40 mg total) by mouth daily as needed (for leg swelling.  Give PRN potassium when Lasix is given). Patient not taking: Reported on 01/24/2016 07/22/14   Bonnielee Haff, MD  lacosamide (VIMPAT) 200 MG TABS tablet Take 1 tablet (200 mg total) by mouth 2 (two) times daily. 07/24/15   Marcial Pacas, MD  LORazepam (ATIVAN) 2 MG/ML injection Inject 0.5 mLs (1 mg total) into the muscle every 6 (six) hours as needed for seizure. 08/24/15   Hongalgi, Lenis Dickinson, MD  magnesium oxide (MAG-OX) 400 MG tablet Take 1 tablet (400 mg total) by mouth daily. 03/12/15   Whiteheart, Cristal Ford, NP  methocarbamol (ROBAXIN) 500 MG tablet Take 1 tablet (500 mg total) by mouth every 8 (eight) hours as needed for muscle spasms. Patient not taking: Reported on 01/24/2016 08/24/15   Wyatt Portela L, PA-C  metoprolol tartrate (LOPRESSOR) 25 MG tablet Take 37.5 mg by mouth 2 (two) times daily.     [provider]  mirtazapine (REMERON) 15 MG tablet Take 7.5 mg by mouth at bedtime.    [provider]  ondansetron (ZOFRAN) 4 MG tablet Take 1 tablet (4 mg total) by mouth every 4 (four) hours as needed for nausea. 07/22/14   Bonnielee Haff, MD  polyethylene glycol [facility] / Floria Raveling) packet Take 17 g by mouth daily as needed for moderate constipation. Patient not taking: Reported on 01/24/2016 08/24/15   Modena Jansky, MD  potassium chloride SA (KLOR-CON M20) 20 MEQ tablet Take 1 tablet (20 mEq total) by mouth daily as needed (whenever she is given Lasix). Patient not taking: Reported on 01/24/2016 07/22/14   Bonnielee Haff, MD  trimethoprim (TRIMPEX) 100 MG tablet Take 100 mg by mouth daily.     [provider]    Physical Exam: Vitals:   07/25/17 1100 07/25/17 1102 07/25/17 1159 07/25/17 1409  BP:   (!) 99/51 (!) 107/54  Pulse:   (!) 120 81  Resp:   (!) 33 15  Temp:       TempSrc:      SpO2: 98%  97% 99%  Weight:  46 kg (101 lb 8 oz)    Height:  5' (1.524 m)      Constitutional: NAD, calm, comfortable Vitals:   07/25/17 1100 07/25/17 1102 07/25/17 1159 07/25/17 1409  BP:   (!) 99/51 (!) 107/54  Pulse:   (!) 120 81  Resp:   (!) 33 15  Temp:      TempSrc:      SpO2: 98%  97% 99%  Weight:  46 kg (101 lb 8 oz)    Height:  5' (1.524 m)     Eyes: PERRL, lids and conjunctivae  normal ENMT: Mucous membranes are moist. Posterior pharynx clear of any exudate or lesions.Normal dentition.  Neck: normal, supple, no masses, no thyromegaly Respiratory: clear to auscultation bilaterally, no wheezing, no crackles. Normal respiratory effort. No accessory muscle use.  Cardiovascular: Regular rate and rhythm, no murmurs / rubs / gallops. No extremity edema. 2+ pedal pulses. No carotid bruits.  Abdomen: no tenderness, no masses palpated. No hepatosplenomegaly. Bowel sounds positive.  Musculoskeletal: no clubbing / cyanosis. No joint deformity upper and lower extremities. Good ROM, no contractures. Normal muscle tone.  Skin: no rashes, lesions, ulcers. No induration Neurologic: CN 2-12 grossly intact. Sensation intact, DTR normal. Strength 5/5 in all 4.  Psychiatric: Normal judgment and insight. Alert and oriented x 3. Normal mood.    Labs on Admission: I have personally reviewed following labs and imaging studies  CBC: Recent Labs  Lab 07/25/17 1130  WBC 12.9*  NEUTROABS 11.4*  HGB 11.6*  HCT 33.5*  MCV 88.4  PLT 627   Basic Metabolic Panel: Recent Labs  Lab 07/25/17 1130  NA 146*  K 4.5  CL 112*  CO2 25  GLUCOSE 90  BUN 26*  CREATININE 1.60*  CALCIUM 9.0   GFR: Estimated Creatinine Clearance: 20.5 mL/min (A) (by C-G formula based on SCr of 1.6 mg/dL (H)). Liver Function Tests: Recent Labs  Lab 07/25/17 1130  AST 27  ALT 10*  ALKPHOS 48  BILITOT 0.6  PROT 6.7  ALBUMIN 3.7   No results for input(s): LIPASE, AMYLASE in the last 168  hours. No results for input(s): AMMONIA in the last 168 hours. Coagulation Profile: No results for input(s): INR, PROTIME in the last 168 hours. Cardiac Enzymes: No results for input(s): CKTOTAL, CKMB, CKMBINDEX, TROPONINI in the last 168 hours. BNP (last 3 results) No results for input(s): PROBNP in the last 8760 hours. HbA1C: No results for input(s): HGBA1C in the last 72 hours. CBG: No results for input(s): GLUCAP in the last 168 hours. Lipid Profile: No results for input(s): CHOL, HDL, LDLCALC, TRIG, CHOLHDL, LDLDIRECT in the last 72 hours. Thyroid Function Tests: No results for input(s): TSH, T4TOTAL, FREET4, T3FREE, THYROIDAB in the last 72 hours. Anemia Panel: No results for input(s): VITAMINB12, FOLATE, FERRITIN, TIBC, IRON, RETICCTPCT in the last 72 hours. Urine analysis:    Component Value Date/Time   COLORURINE YELLOW 07/25/2017 1144   APPEARANCEUR HAZY (A) 07/25/2017 1144   LABSPEC 1.013 07/25/2017 1144   PHURINE 6.0 07/25/2017 1144   GLUCOSEU NEGATIVE 07/25/2017 1144   HGBUR NEGATIVE 07/25/2017 [place] 07/25/2017 1144   KETONESUR NEGATIVE 07/25/2017 1144   PROTEINUR 100 (A) 07/25/2017 1144   UROBILINOGEN 0.2 06/08/2015 2149   NITRITE POSITIVE (A) 07/25/2017 1144   LEUKOCYTESUR LARGE (A) 07/25/2017 1144   Sepsis Labs: !!!!!!!!!!!!!!!!!!!!!!!!!!!!!!!!!!!!!!!!!!!! @LABRCNTIP (procalcitonin:4,lacticidven:4) )No results found for this or any previous visit (from the past 240 hour(s)).   Radiological Exams on Admission: Dg Chest Port 1 View  Result Date: 07/25/2017 CLINICAL DATA:  Seizures, weakness, fever EXAM: PORTABLE CHEST 1 VIEW COMPARISON:  08/18/2015 FINDINGS: There is no focal parenchymal opacity. There is no pleural effusion or pneumothorax. The heart and mediastinal contours are unremarkable. There is old posttraumatic deformity of the right proximal humerus. IMPRESSION: No active disease. Electronically Signed   By: Kathreen Devoid   On:  07/25/2017 11:32    EKG: Independently reviewed. Sinus tach  Assessment/Plan Principal Problem:   Sepsis secondary to UTI [facility]) Active Problems:   Memory loss   Pulmonary embolism ([place])  Seizures ([place])   H/O: stroke with residual effects   History of TTP (thrombotic thrombocytopenic purpura)   1. Sepsis due to UTI present on admission 1. UA suggestive of UTI 2. Pt with leukocytosis and tachycardia 3. Zosyn given in ED. 4. Prior urine culture reviewed. Historically, patient has had UTIs secondary to pansensitive E. coli as well as Proteus which was sensitive to Rocephin.  We will continue patient on Rocephin empirically 5. Follow cultures 6. She is on therapeutic anticoagulation.  Will order head CT to rule out intracranial bleed 2. Memory Loss 1. Appears stable at this time 3. Hx PE on anticoagulation 1. Pt continued on eliquis 2. No signs of acute bleeding 4. Chronic Therapeutic anticoagulation 1. Per above, pt on eliquis 2. Has hx of CVA and documented hx of PE 5. Seizures 1. Vimpat started in ED 2. Suspect lowered seizure threshold secondary to acute illness/sepsis 3. Would continue home regimen for now 4. Continue patient on as needed IV Ativan for seizures.  If patient continues to seize despite treatment for UTI with sepsis, would consult neurology at that time 5. Will order EEG 6. Hx stroke 1. Appears stable at this time  DVT prophylaxis: Eliquis  Code Status: Full Family Communication: Pt in room  Disposition Plan: Uncertain at this time  Consults called:  Admission status:  inpatient as would likely require greater than 2 midnight stay for IV antibiotics for UTI with sepsis.  Marylu Lund MD [facility] Pager [phone]  If 7PM-7AM, please contact night-coverage [email or web address] Password [facility]  07/25/2017, 2:37 PM

## 2017-07-25 NOTE — Patient Care Conference (Signed)
Discussed with patient's nephew who states patient's wishes are DNR/DNI. Patient's nephew states that performing CPR would cause more injuries and that the family has "come to grips with it." DNR orders placed.

## 2017-07-25 NOTE — ED Triage Notes (Addendum)
Per [facility]- DNR YELLOW COPY PRESENT- Witness seizures (15 seizures full body ) HX of the same. Pt at times refuses meds. Last took medications yesterday. Last known well last night. Seizures started 0830. Hypotensive, hypoxia and fever. 300 cc NS Bolus given in route, NRB placed Temp 100.6 AX. Ativan IM x 3 0844, [id number]. Ativan 2mg  IM given [id number] by staff. Pt presents unresponsive. Responses to pain. EDP Knapp evaluated upon arrival to room.

## 2017-07-25 NOTE — ED Notes (Signed)
Assigned 1442 @ 17:34 call report @ 17:54

## 2017-07-25 NOTE — Progress Notes (Signed)
A consult was received from an ED physician for vancomycin and zosyn per pharmacy dosing.  The patient's profile has been reviewed for ht/wt/allergies/indication/available labs.  Continue with order for vanco and zosyn as entered per EDP.  Marland Kitchen    Further antibiotics/pharmacy consults should be ordered by admitting physician if indicated.                       Thank you, Clovis Riley 07/25/2017  11:22 AM

## 2017-07-26 ENCOUNTER — Inpatient Hospital Stay (HOSPITAL_COMMUNITY)
Admit: 2017-07-26 | Discharge: 2017-07-26 | Disposition: A | Discharge status: Home / Self Care | Payer: Medicare Other | Attending: Internal Medicine | Admitting: Internal Medicine

## 2017-07-26 DIAGNOSIS — R413 Other amnesia: Secondary | ICD-10-CM

## 2017-07-26 LAB — CBC
HCT: 31.5 % — ABNORMAL LOW (ref 36.0–46.0)
Hemoglobin: 11.1 g/dL — ABNORMAL LOW (ref 12.0–15.0)
MCH: 30.7 pg (ref 26.0–34.0)
MCHC: 35.2 g/dL (ref 30.0–36.0)
MCV: 87 fL (ref 78.0–100.0)
PLATELETS: 130 10*3/uL — AB (ref 150–400)
RBC: 3.62 MIL/uL — ABNORMAL LOW (ref 3.87–5.11)
RDW: 15 % (ref 11.5–15.5)
WBC: 7.8 10*3/uL (ref 4.0–10.5)

## 2017-07-26 LAB — COMPREHENSIVE METABOLIC PANEL
ALK PHOS: 41 U/L (ref 38–126)
ALT: 14 U/L (ref 14–54)
AST: 43 U/L — AB (ref 15–41)
Albumin: 2.9 g/dL — ABNORMAL LOW (ref 3.5–5.0)
Anion gap: 6 (ref 5–15)
BILIRUBIN TOTAL: 0.8 mg/dL (ref 0.3–1.2)
BUN: 18 mg/dL (ref 6–20)
CO2: 24 mmol/L (ref 22–32)
CREATININE: 1.09 mg/dL — AB (ref 0.44–1.00)
Calcium: 7.8 mg/dL — ABNORMAL LOW (ref 8.9–10.3)
Chloride: 110 mmol/L (ref 101–111)
GFR calc Af Amer: 54 mL/min — ABNORMAL LOW (ref >60)
GFR, EST NON AFRICAN AMERICAN: 47 mL/min — AB (ref >60)
GLUCOSE: 71 mg/dL (ref 65–99)
Potassium: 5.9 mmol/L — ABNORMAL HIGH (ref 3.5–5.1)
Sodium: 140 mmol/L (ref 135–145)
TOTAL PROTEIN: 5.6 g/dL — AB (ref 6.5–8.1)

## 2017-07-26 MED ORDER — APIXABAN 2.5 MG PO TABS
2.5000 mg | ORAL_TABLET | Freq: Two times a day (BID) | ORAL | Status: DC
  Administered 2017-07-28 – 2017-07-29 (×3): 2.5 mg via ORAL
  Filled 2017-07-26 (×7): qty 1

## 2017-07-26 MED ORDER — INFLUENZA VAC SPLIT HIGH-DOSE 0.5 ML IM SUSY
0.5000 mL | PREFILLED_SYRINGE | INTRAMUSCULAR | Status: AC
  Administered 2017-07-27: 0.5 mL via INTRAMUSCULAR
  Filled 2017-07-26: qty 0.5

## 2017-07-26 MED ORDER — ENSURE ENLIVE PO LIQD
237.0000 mL | Freq: Three times a day (TID) | ORAL | Status: DC
  Administered 2017-07-27 – 2017-07-29 (×3): 237 mL via ORAL

## 2017-07-26 NOTE — Progress Notes (Signed)
EEG completed, results pending. 

## 2017-07-26 NOTE — Progress Notes (Signed)
Patient has remained lethargic and non verbal all night shift. Has slept the entire night and only responds to painful stimuli with moans. Will continue to monitor patient.

## 2017-07-26 NOTE — Progress Notes (Signed)
Initial Nutrition Assessment  DOCUMENTATION CODES:   Underweight  INTERVENTION:   Ensure Enlive po TID, each supplement provides 350 kcal and 20 grams of protein  NUTRITION DIAGNOSIS:   Inadequate oral intake related to lethargy/confusion as evidenced by meal completion < 25%.  GOAL:   Patient will meet greater than or equal to 90% of their needs  MONITOR:   PO intake, Weight trends, Supplement acceptance, Labs  REASON FOR ASSESSMENT:   Malnutrition Screening Tool    ASSESSMENT:   Pt with PMH significant for seizure disorder, PE, TTP, and CVA. Presents this admission from skilled nursing facility with sepsis due to UTI with suspected lower seizure threshold secondary to acute illness.    Pt lethargic and unable to provide intake history. Pt currently on regular diet with 0 meal completions charted. Nursing reports feeling uncomfortable giving pateint food at this time given lethargic mental state. SNF did not provide any intake history. Will attempt Ensure once patient's mental status improves.   Records indicate pt has lost 11.5% of body weight in one year. This is not significant. Will need to obtain more weight history once family is present.   Unable to complete Nutrition-Focused physical exam at this time due to patient positioning. Suspect pt has some form of malnutrition but unable to diagnose at this time with lack of evidence.   Medications reviewed and include: NS @ 100 ml/hr, IV abx Labs reviewed: K 5.9 (H) Creatinine 1.09 (H) AST 43 (H)   NUTRITION - FOCUSED PHYSICAL EXAM:    Most Recent Value  Orbital Region  Unable to assess  Upper Arm Region  Unable to assess  Thoracic and Lumbar Region  Unable to assess  Buccal Region  Unable to assess  [place] Region  Unable to assess  Clavicle Bone Region  Unable to assess  Clavicle and Acromion Bone Region  Unable to assess  Scapular Bone Region  Unable to assess  Dorsal Hand  Unable to assess  Patellar Region   Unable to assess  Anterior Thigh Region  Unable to assess  Posterior Calf Region  Unable to assess  Edema (RD Assessment)  Unable to assess  Hair  Unable to assess  Eyes  Unable to assess  Mouth  Unable to assess  Skin  Unable to assess  Nails  Unable to assess       Diet Order:  Diet regular Room service appropriate? Yes; Fluid consistency: Thin  EDUCATION NEEDS:   Not appropriate for education at this time  Skin:  Skin Assessment: Reviewed RN Assessment  Last BM:  PTA  Height:   Ht Readings from Last 1 Encounters:  07/25/17 5' (1.524 m)    Weight:   Wt Readings from Last 1 Encounters:  07/25/17 84 lb 14 oz (38.5 kg)    Ideal Body Weight:  45.5 kg  BMI:  Body mass index is 16.58 kg/m.  Estimated Nutritional Needs:   Kcal:  1500-1700 kcal/day  Protein:  70-80 g/day  Fluid:  >1.5 L/day    Mariana Single RD, LDN Clinical Nutrition Pager # - [phone]

## 2017-07-26 NOTE — Progress Notes (Signed)
Patient ID: Tanya White, female   DOB: [birth year], 81 y.o.   MRN: [id number]    PROGRESS NOTE    Tanya White  ION:[id number] DOB: 1936/07/16 DOA: 07/25/2017  PCP: [name], MD   Brief Narrative:  81 y.o. female with medical history significant of seizure disorder, history of pulmonary embolism, TTP, prior stroke who presents to the emergency department from nursing facility with witnessed pain and ? seizure episodes.  Pt was confused and lethargic in ED, started on Rocephin for presumptive UTI, also started on Vimpat IV.   Assessment & Plan:   Principal Problem:   Sepsis secondary to UTI ([place]) - started on Rocephin, preliminary urine culture with E. Coli - follow up on final report and narrow down ABX as clinically indicated - WBC is now WNL, will repeat again in AM  Active Problems:   Acute metabolic encephalopathy - possibly from UTI vs Seizures, hypernatremia  - started on Vimpat - will follow up on EEG - treat UTI - if recurrent seizures, will consult with neurology team     Hypernatremia - pre renal in etiology - resolved     Hyperkalemia - will repeat BMP in AM    Pulmonary embolism ([place]) - continue Apixiban    Acute kidney injury - pre renal - resolved     H/O: stroke with residual effects   History of TTP (thrombotic thrombocytopenic purpura)  DVT prophylaxis: Apixaban Code Status: DNR Family Communication: no family at bedside  Disposition Plan: SNF when medically stable   Consultants:   None  Procedures:   EEG 12/11 -->  Antimicrobials:   Rocephin 12/10 -->  Subjective: No events overnight.   Objective: Vitals:   07/25/17 1852 07/25/17 2055 07/26/17 0447 07/26/17 1313  BP: 109/83 (!) 131/55 111/88 (!) 141/70  Pulse: 89 81 70 77  Resp: 16 16 18 18   Temp: 98.5 F (36.9 C) 98 F (36.7 C) 98.2 F (36.8 C) 98.9 F (37.2 C)  TempSrc: Axillary Axillary Axillary Axillary  SpO2: 100% 100% 100% 98%  Weight: 38.5 kg (84 lb 14  oz)     Height: 5' (1.524 m)       Intake/Output Summary (Last 24 hours) at 07/26/2017 1337 Last data filed at 07/26/2017 0700 Gross per 24 hour  Intake 1285.83 ml  Output -  Net 1285.83 ml   Filed Weights   07/25/17 1102 07/25/17 1852  Weight: 46 kg (101 lb 8 oz) 38.5 kg (84 lb 14 oz)    Examination:  General exam: Appears calm and comfortable, somnolent but not in distress  Respiratory system: Clear to auscultation. Respiratory effort normal. Cardiovascular system: S1 & S2 heard, RRR. No JVD, murmurs, rubs, gallops or clicks. No pedal edema. Gastrointestinal system: Abdomen is nondistended, soft and nontender. No organomegaly or masses felt.  Central nervous system: somnolent, opens eyes   Data Reviewed: I have personally reviewed following labs and imaging studies  CBC: Recent Labs  Lab 07/25/17 1130 07/26/17 0514  WBC 12.9* 7.8  NEUTROABS 11.4*  --   HGB 11.6* 11.1*  HCT 33.5* 31.5*  MCV 88.4 87.0  PLT 161 244*   Basic Metabolic Panel: Recent Labs  Lab 07/25/17 1130 07/26/17 0514  NA 146* 140  K 4.5 5.9*  CL 112* 110  CO2 25 24  GLUCOSE 90 71  BUN 26* 18  CREATININE 1.60* 1.09*  CALCIUM 9.0 7.8*   Liver Function Tests: Recent Labs  Lab 07/25/17 1130 07/26/17 0514  AST 27 43*  ALT  10* 14  ALKPHOS 48 41  BILITOT 0.6 0.8  PROT 6.7 5.6*  ALBUMIN 3.7 2.9*   Urine analysis:    Component Value Date/Time   COLORURINE YELLOW 07/25/2017 1144   APPEARANCEUR HAZY (A) 07/25/2017 1144   LABSPEC 1.013 07/25/2017 1144   PHURINE 6.0 07/25/2017 1144   GLUCOSEU NEGATIVE 07/25/2017 1144   HGBUR NEGATIVE 07/25/2017 [place] 07/25/2017 1144   KETONESUR NEGATIVE 07/25/2017 1144   PROTEINUR 100 (A) 07/25/2017 1144   UROBILINOGEN 0.2 06/08/2015 2149   NITRITE POSITIVE (A) 07/25/2017 1144   LEUKOCYTESUR LARGE (A) 07/25/2017 1144   Recent Results (from the past 240 hour(s))  Blood Culture (routine x 2)     Status: None (Preliminary result)     Collection Time: 07/25/17 11:32 AM  Result Value Ref Range Status   Specimen Description BLOOD BLOOD RIGHT ARM  Final   Special Requests   Final    BOTTLES DRAWN AEROBIC AND ANAEROBIC Blood Culture results may not be optimal due to an inadequate volume of blood received in culture bottles   Culture   Final    NO GROWTH < 24 HOURS Performed at [facility], [place] [address], [place], [place] [address]    Report Status PENDING  Incomplete  Blood Culture (routine x 2)     Status: None (Preliminary result)   Collection Time: 07/25/17 11:39 AM  Result Value Ref Range Status   Specimen Description BLOOD BLOOD LEFT ARM  Final   Special Requests IN PEDIATRIC BOTTLE Blood Culture adequate volume  Final   Culture   Final    NO GROWTH < 24 HOURS Performed at [facility], [place] [address], [place], [place] [address]    Report Status PENDING  Incomplete  Urine culture     Status: Abnormal (Preliminary result)   Collection Time: 07/25/17 11:44 AM  Result Value Ref Range Status   Specimen Description URINE, CATHETERIZED  Final   Special Requests NONE  Final   Culture >=100,000 COLONIES/mL ESCHERICHIA COLI (A)  Final   Report Status PENDING  Incomplete  MRSA PCR Screening     Status: Abnormal   Collection Time: 07/25/17  7:15 PM  Result Value Ref Range Status   MRSA by PCR POSITIVE (A) NEGATIVE Final    Comment:        The GeneXpert MRSA Assay (FDA approved for NASAL specimens only), is one component of a comprehensive MRSA colonization surveillance program. It is not intended to diagnose MRSA infection nor to guide or monitor treatment for MRSA infections. RESULT CALLED TO, READ BACK BY AND VERIFIED WITH: BROWN,D RN 12.10.18 @2106  ZANDO,C     Radiology Studies: Ct Head Wo Contrast  Result Date: 07/25/2017 CLINICAL DATA:  Seizure. EXAM: CT HEAD WITHOUT CONTRAST TECHNIQUE: Contiguous axial images were obtained from the base of the skull through the vertex without  intravenous contrast. COMPARISON:  01/24/2016 FINDINGS: Brain: There is no evidence for acute hemorrhage, hydrocephalus, mass lesion, or abnormal extra-axial fluid collection. No definite CT evidence for acute infarction. Old posterior left MCA territory infarct again noted. Diffuse loss of parenchymal volume is consistent with atrophy. Patchy low attenuation in the deep hemispheric and periventricular white matter is nonspecific, but likely reflects chronic microvascular ischemic demyelination. Vascular: No hyperdense vessel or unexpected calcification. Skull: No evidence for fracture. No worrisome lytic or sclerotic lesion. Sinuses/Orbits: The visualized paranasal sinuses and mastoid air cells are clear. Visualized portions of the globes and intraorbital fat are  unremarkable. Other: None. IMPRESSION: 1. Stable exam.  No acute intracranial abnormality. 2. Atrophy with chronic small vessel white matter ischemic disease. 3. Old posterior left MCA territory infarct. Electronically Signed   By: Misty Stanley M.D.   On: 07/25/2017 17:57   Dg Chest Port 1 View  Result Date: 07/25/2017 CLINICAL DATA:  Seizures, weakness, fever EXAM: PORTABLE CHEST 1 VIEW COMPARISON:  08/18/2015 FINDINGS: There is no focal parenchymal opacity. There is no pleural effusion or pneumothorax. The heart and mediastinal contours are unremarkable. There is old posttraumatic deformity of the right proximal humerus. IMPRESSION: No active disease. Electronically Signed   By: Kathreen Devoid   On: 07/25/2017 11:32   Scheduled Meds: . apixaban  2.5 mg Oral BID  . chlorhexidine  15 mL Mouth Rinse BID  . Chlorhexidine Gluconate Cloth  6 each Topical Q0600  . feeding supplement (ENSURE ENLIVE)  237 mL Oral TID BM  . mouth rinse  15 mL Mouth Rinse q12n4p  . metoprolol tartrate  5 mg Intravenous Q6H  . mupirocin ointment  1 application Nasal BID   Continuous Infusions: . sodium chloride 100 mL/hr at 07/26/17 1133  . cefTRIAXone (ROCEPHIN)   IV Stopped (07/25/17 1905)  . lacosamide (VIMPAT) IV Stopped (07/26/17 1100)  . valproate sodium Stopped (07/26/17 1232)     LOS: 1 day   Time spent: 25 minutes   Faye Ramsay, MD [facility] Pager [phone]  If 7PM-7AM, please contact night-coverage [email or web address] Password [email or web address] 07/26/2017, 1:37 PM

## 2017-07-26 NOTE — Procedures (Signed)
History: 81 year old female with a history of dementia and seizures  Sedation: None  Technique: This is a 21 channel routine scalp EEG performed at the bedside with bipolar and monopolar montages arranged in accordance to the international 10/20 system of electrode placement. One channel was dedicated to EKG recording.    Background: There is a posterior dominant rhythm that achieves 9 Hz but is poorly organized.  This is seen bilaterally, but seen better on the right than left.  In addition there is generalized irregular delta and theta activity.  Sleep is not recorded.  Photic stimulation: Physiologic driving is not performed  EEG Abnormalities: 1) Assymetric backgound 2) Generalized irregular slow activity  Clinical Interpretation: This EEG demonstrates evidence of a left hemispheric dysfunction that could be consistent with the patient's known history of infarct.  There is also evidence of a generalized nonspecific cerebral dysfunction (encephalopathy).    There was no seizure or seizure predisposition recorded on this study. Please note that a normal EEG does not preclude the possibility of epilepsy.   Roland Rack, MD [facility] [phone]  If 7pm- 7am, please page neurology on call as listed in [place].

## 2017-07-26 NOTE — Clinical Social Work Note (Signed)
Clinical Social Work Assessment  Patient Details  Name: Tanya White MRN: [id number] Date of Birth: 09/06/1935  Date of referral:  07/26/17               Reason for consult:  (admitted from facility)                Permission sought to share information with:  Family Supports, Chartered certified accountant granted to share information::  Yes, Verbal Permission Granted  Name::     nephew Daisey Must::  [facility]  Relationship::     Contact Information:     Housing/Transportation Living arrangements for the past 2 months:  [place] of Information:  Power of [place] Patient Interpreter Needed:  None Criminal Activity/Legal Involvement Pertinent to Current Situation/Hospitalization:  No - Comment as needed Significant Relationships:  Warehouse manager, Other Family Members, Adult Children Lives with:  Facility Resident Do you feel safe going back to the place where you live?  Yes Need for family participation in patient care:  Yes (Comment)(nephew POA and pt confused- he is primary Media planner)  Care giving concerns:  Pt admitted from SNF- she is a long term care resident. No caregiving concerns reported, is total care with advanced dementia   Social Worker assessment / plan:  CSW consulted to assist as pt is admitted from facility- [facility] where she has resided for about 2 years. Has dementia and is oriented to self and that she is not at home but otherwise confused. Nephew is POA- CSW spoke with him and he reports this is pt's typical mental status.  Nephew reports family pleased with care at [facility] and plan for pt to return at DC. Spoke with facility admissions representative to confirm pt is resident and plans to return. Provided access to chart via the [facility] and will provide updated FL2 and assist with transition back to [place] at DC if that is still the appropriate level of care.  Employment  status:  Retired Nurse, adult PT Recommendations:  Not assessed at this time Information / Referral to community resources:  [facility]  Patient/Family's Response to care:  [employer]   Patient/Family's Understanding of and Emotional Response to Diagnosis, Current Treatment, and Prognosis:  Pt does not demonstrate understanding- confused/dementia. Nephew has good understanding of treatment and plan. Emotionally calm and appropriate. States, "She declines a little all the time but it is what it is with her condition."  Emotional Assessment Appearance:  Appears stated age Attitude/Demeanor/Rapport:  (confused) Affect (typically observed):  Calm Orientation:  Oriented to Self, Oriented to Place Alcohol / Substance use:  Not Applicable Psych involvement (Current and /or in the community):  No (Comment)  Discharge Needs  Concerns to be addressed:  Care Coordination Readmission within the last 30 days:  No Current discharge risk:  None Barriers to Discharge:  Continued Medical Work up   [employer], LCSW 07/26/2017, 2:47 PM [phone]

## 2017-07-27 LAB — CBC
HCT: 30.5 % — ABNORMAL LOW (ref 36.0–46.0)
HEMOGLOBIN: 10.4 g/dL — AB (ref 12.0–15.0)
MCH: 30.1 pg (ref 26.0–34.0)
MCHC: 34.1 g/dL (ref 30.0–36.0)
MCV: 88.4 fL (ref 78.0–100.0)
PLATELETS: 161 10*3/uL (ref 150–400)
RBC: 3.45 MIL/uL — ABNORMAL LOW (ref 3.87–5.11)
RDW: 14.9 % (ref 11.5–15.5)
WBC: 5 10*3/uL (ref 4.0–10.5)

## 2017-07-27 LAB — BASIC METABOLIC PANEL
Anion gap: 4 — ABNORMAL LOW (ref 5–15)
BUN: 17 mg/dL (ref 6–20)
CALCIUM: 8.3 mg/dL — AB (ref 8.9–10.3)
CO2: 25 mmol/L (ref 22–32)
CREATININE: 0.74 mg/dL (ref 0.44–1.00)
Chloride: 111 mmol/L (ref 101–111)
Glucose, Bld: 82 mg/dL (ref 65–99)
Potassium: 3.8 mmol/L (ref 3.5–5.1)
SODIUM: 140 mmol/L (ref 135–145)

## 2017-07-27 LAB — URINE CULTURE

## 2017-07-27 MED ORDER — SODIUM CHLORIDE 0.9 % IV SOLN
200.0000 mg | Freq: Two times a day (BID) | INTRAVENOUS | Status: DC
  Administered 2017-07-27 – 2017-07-29 (×6): 200 mg via INTRAVENOUS
  Filled 2017-07-27 (×9): qty 20

## 2017-07-27 MED ORDER — DIVALPROEX SODIUM 125 MG PO CSDR
250.0000 mg | DELAYED_RELEASE_CAPSULE | Freq: Two times a day (BID) | ORAL | Status: DC
  Filled 2017-07-27: qty 2

## 2017-07-27 MED ORDER — LACOSAMIDE 50 MG PO TABS
200.0000 mg | ORAL_TABLET | Freq: Two times a day (BID) | ORAL | Status: DC
  Filled 2017-07-27: qty 4

## 2017-07-27 MED ORDER — VALPROATE SODIUM 500 MG/5ML IV SOLN
250.0000 mg | Freq: Two times a day (BID) | INTRAVENOUS | Status: DC
  Administered 2017-07-27 – 2017-07-30 (×7): 250 mg via INTRAVENOUS
  Filled 2017-07-27 (×8): qty 2.5

## 2017-07-27 MED ORDER — ONDANSETRON HCL 4 MG PO TABS: 4.0000 mg | ORAL_TABLET | ORAL | Status: DC | PRN

## 2017-07-27 NOTE — Progress Notes (Signed)
Patient refuses all PO food and meds at this time. Patient does not open mouth and pushes food/drink away.

## 2017-07-27 NOTE — Plan of Care (Signed)
Plan of care reviewed. 

## 2017-07-27 NOTE — Progress Notes (Addendum)
Patient ID: Tanya White, female   DOB: 1936/06/25, 81 y.o.   MRN: [id number]    PROGRESS NOTE  Tanya White  TKZ:[id number] DOB: 17-Mar-1936 DOA: 07/25/2017  PCP: [name], MD   Brief Narrative:  81 y.o. female with medical history significant of seizure disorder, history of pulmonary embolism, TTP, prior stroke who presents to the emergency department from nursing facility with witnessed pain and ? seizure episodes.  Pt was confused and lethargic in ED, started on Rocephin for presumptive UTI, also started on Vimpat IV.   Assessment & Plan:   Principal Problem:   Sepsis secondary to UTI ([place]) - started on Rocephin, cultures with E. Coli sensitive to Rocephin  - WBC is now down WNL - will plan to change to PO ABX in AM if pt able to tolerate PO   Active Problems:   Acute metabolic encephalopathy - possibly from UTI vs Seizures, hypernatremia  - started on Vimpat IV and depakote IV, will plan to change to PO today  - EEG consistent with prior infarction but no acute findings  - if no seizures on PO regimen, can possibly be d/c in AM    Hypernatremia - pre renal in etiology - resolved     Hyperkalemia - resolved     Pulmonary embolism (HCC) - continue Apixiban    Acute kidney injury - pre renal - resolved     H/O: stroke with residual effects   History of TTP (thrombotic thrombocytopenic purpura)  DVT prophylaxis: Apixaban Code Status: DNR Family Communication: no family at bedside, family updated over the phone  Disposition Plan: SNF in 1-2 days   Consultants:   None  Procedures:   EEG 12/11 --> findings consistent with old infarction   Antimicrobials:   Rocephin 12/10 -->  Subjective: No events overnight.   Objective: Vitals:   07/26/17 0447 07/26/17 1313 07/26/17 2152 07/27/17 0640  BP: 111/88 (!) 141/70 (!) 128/59 (!) 111/55  Pulse: 70 77 78 80  Resp: 18 18 18 18   Temp: 98.2 F (36.8 C) 98.9 F (37.2 C) 98.2 F (36.8 C) 98 F (36.7  C)  TempSrc: Axillary Axillary Axillary Axillary  SpO2: 100% 98% 96% 95%  Weight:      Height:        Intake/Output Summary (Last 24 hours) at 07/27/2017 0842 Last data filed at 07/27/2017 0600 Gross per 24 hour  Intake 2225 ml  Output -  Net 2225 ml   Filed Weights   07/25/17 1102 07/25/17 1852  Weight: 46 kg (101 lb 8 oz) 38.5 kg (84 lb 14 oz)    Physical Exam  Constitutional: Appears slightly somnolent but easy to awake  CVS: RRR, S1/S2 +, no gallops, no carotid bruit.  Pulmonary: Effort and breath sounds normal, no stridor, rhonchi, wheezes, rales.  Abdominal: Soft. BS +,  no distension, tenderness Musculoskeletal:  No edema and no tenderness.  Neuro: somnolent but easy to awake  Data Reviewed: I have personally reviewed following labs and imaging studies  CBC: Recent Labs  Lab 07/25/17 1130 07/26/17 0514 07/27/17 0430  WBC 12.9* 7.8 5.0  NEUTROABS 11.4*  --   --   HGB 11.6* 11.1* 10.4*  HCT 33.5* 31.5* 30.5*  MCV 88.4 87.0 88.4  PLT 161 130* 573   Basic Metabolic Panel: Recent Labs  Lab 07/25/17 1130 07/26/17 0514 07/27/17 0430  NA 146* 140 140  K 4.5 5.9* 3.8  CL 112* 110 111  CO2 25 24 25   GLUCOSE 90 71  82  BUN 26* 18 17  CREATININE 1.60* 1.09* 0.74  CALCIUM 9.0 7.8* 8.3*   Liver Function Tests: Recent Labs  Lab 07/25/17 1130 07/26/17 0514  AST 27 43*  ALT 10* 14  ALKPHOS 48 41  BILITOT 0.6 0.8  PROT 6.7 5.6*  ALBUMIN 3.7 2.9*   Urine analysis:    Component Value Date/Time   COLORURINE YELLOW 07/25/2017 1144   APPEARANCEUR HAZY (A) 07/25/2017 1144   LABSPEC 1.013 07/25/2017 1144   PHURINE 6.0 07/25/2017 1144   GLUCOSEU NEGATIVE 07/25/2017 1144   HGBUR NEGATIVE 07/25/2017 1144   [facility] 07/25/2017 1144   KETONESUR NEGATIVE 07/25/2017 1144   PROTEINUR 100 (A) 07/25/2017 1144   UROBILINOGEN 0.2 06/08/2015 2149   NITRITE POSITIVE (A) 07/25/2017 1144   LEUKOCYTESUR LARGE (A) 07/25/2017 1144   Recent Results (from the  past 240 hour(s))  Blood Culture (routine x 2)     Status: None (Preliminary result)   Collection Time: 07/25/17 11:32 AM  Result Value Ref Range Status   Specimen Description BLOOD BLOOD RIGHT ARM  Final   Special Requests   Final    BOTTLES DRAWN AEROBIC AND ANAEROBIC Blood Culture results may not be optimal due to an inadequate volume of blood received in culture bottles   Culture   Final    NO GROWTH < 24 HOURS Performed at [facility], [place] [address], [place], [place] [address]    Report Status PENDING  Incomplete  Blood Culture (routine x 2)     Status: None (Preliminary result)   Collection Time: 07/25/17 11:39 AM  Result Value Ref Range Status   Specimen Description BLOOD BLOOD LEFT ARM  Final   Special Requests IN PEDIATRIC BOTTLE Blood Culture adequate volume  Final   Culture   Final    NO GROWTH < 24 HOURS Performed at [facility], [place] [address] [place], [place] [address]    Report Status PENDING  Incomplete  Urine culture     Status: Abnormal   Collection Time: 07/25/17 11:44 AM  Result Value Ref Range Status   Specimen Description URINE, CATHETERIZED  Final   Special Requests NONE  Final   Culture >=100,000 COLONIES/mL ESCHERICHIA COLI (A)  Final   Report Status 07/27/2017 FINAL  Final   Organism ID, Bacteria ESCHERICHIA COLI (A)  Final      Susceptibility   Escherichia coli - MIC*    AMPICILLIN >=32 RESISTANT Resistant     CEFAZOLIN <=4 SENSITIVE Sensitive     CEFTRIAXONE <=1 SENSITIVE Sensitive     CIPROFLOXACIN >=4 RESISTANT Resistant     GENTAMICIN <=1 SENSITIVE Sensitive     IMIPENEM <=0.25 SENSITIVE Sensitive     NITROFURANTOIN <=16 SENSITIVE Sensitive     TRIMETH/SULFA >=320 RESISTANT Resistant     AMPICILLIN/SULBACTAM 4 SENSITIVE Sensitive     PIP/TAZO <=4 SENSITIVE Sensitive     Extended ESBL NEGATIVE Sensitive     * >=100,000 COLONIES/mL ESCHERICHIA COLI  MRSA PCR Screening     Status: Abnormal   Collection Time: 07/25/17  7:15 PM    Result Value Ref Range Status   MRSA by PCR POSITIVE (A) NEGATIVE Final    Comment:        The GeneXpert MRSA Assay (FDA approved for NASAL specimens only), is one component of a comprehensive MRSA colonization surveillance program. It is not intended to diagnose MRSA infection nor to guide or monitor treatment for MRSA infections. RESULT CALLED TO, READ BACK  BY AND VERIFIED WITH: BROWN,D RN 12.10.18 @2106  ZANDO,C     Radiology Studies: Ct Head Wo Contrast  Result Date: 07/25/2017 CLINICAL DATA:  Seizure. EXAM: CT HEAD WITHOUT CONTRAST TECHNIQUE: Contiguous axial images were obtained from the base of the skull through the vertex without intravenous contrast. COMPARISON:  01/24/2016 FINDINGS: Brain: There is no evidence for acute hemorrhage, hydrocephalus, mass lesion, or abnormal extra-axial fluid collection. No definite CT evidence for acute infarction. Old posterior left MCA territory infarct again noted. Diffuse loss of parenchymal volume is consistent with atrophy. Patchy low attenuation in the deep hemispheric and periventricular white matter is nonspecific, but likely reflects chronic microvascular ischemic demyelination. Vascular: No hyperdense vessel or unexpected calcification. Skull: No evidence for fracture. No worrisome lytic or sclerotic lesion. Sinuses/Orbits: The visualized paranasal sinuses and mastoid air cells are clear. Visualized portions of the globes and intraorbital fat are unremarkable. Other: None. IMPRESSION: 1. Stable exam.  No acute intracranial abnormality. 2. Atrophy with chronic small vessel white matter ischemic disease. 3. Old posterior left MCA territory infarct. Electronically Signed   By: Misty Stanley M.D.   On: 07/25/2017 17:57   Dg Chest Port 1 View  Result Date: 07/25/2017 CLINICAL DATA:  Seizures, weakness, fever EXAM: PORTABLE CHEST 1 VIEW COMPARISON:  08/18/2015 FINDINGS: There is no focal parenchymal opacity. There is no pleural effusion or  pneumothorax. The heart and mediastinal contours are unremarkable. There is old posttraumatic deformity of the right proximal humerus. IMPRESSION: No active disease. Electronically Signed   By: Kathreen Devoid   On: 07/25/2017 11:32   Scheduled Meds: . apixaban  2.5 mg Oral BID  . chlorhexidine  15 mL Mouth Rinse BID  . Chlorhexidine Gluconate Cloth  6 each Topical Q0600  . feeding supplement (ENSURE ENLIVE)  237 mL Oral TID BM  . Influenza vac split quadrivalent PF  0.5 mL Intramuscular Tomorrow-1000  . mouth rinse  15 mL Mouth Rinse q12n4p  . metoprolol tartrate  5 mg Intravenous Q6H  . mupirocin ointment  1 application Nasal BID   Continuous Infusions: . sodium chloride 75 mL/hr at 07/27/17 0304  . cefTRIAXone (ROCEPHIN)  IV Stopped (07/26/17 1745)  . lacosamide (VIMPAT) IV Stopped (07/26/17 2150)  . valproate sodium Stopped (07/27/17 0005)     LOS: 2 days   Time spent: 25 minutes   Faye Ramsay, MD [facility] Pager [phone]  If 7PM-7AM, please contact night-coverage [email or web address] Password [email or web address] 07/27/2017, 8:42 AM

## 2017-07-27 NOTE — Progress Notes (Signed)
Patient continues to refuse PO meds. MD notified. Verbal order to have pharmacy switch patient back to IV depakote.

## 2017-07-28 LAB — BASIC METABOLIC PANEL
Anion gap: 6 (ref 5–15)
BUN: 11 mg/dL (ref 6–20)
CHLORIDE: 107 mmol/L (ref 101–111)
CO2: 27 mmol/L (ref 22–32)
Calcium: 8.4 mg/dL — ABNORMAL LOW (ref 8.9–10.3)
Creatinine, Ser: 0.67 mg/dL (ref 0.44–1.00)
GFR calc Af Amer: >60 mL/min (ref >60)
GFR calc non Af Amer: >60 mL/min (ref >60)
GLUCOSE: 85 mg/dL (ref 65–99)
POTASSIUM: 3.3 mmol/L — AB (ref 3.5–5.1)
SODIUM: 140 mmol/L (ref 135–145)

## 2017-07-28 LAB — CBC
HCT: 31.8 % — ABNORMAL LOW (ref 36.0–46.0)
HEMOGLOBIN: 11.2 g/dL — AB (ref 12.0–15.0)
MCH: 30.6 pg (ref 26.0–34.0)
MCHC: 35.2 g/dL (ref 30.0–36.0)
MCV: 86.9 fL (ref 78.0–100.0)
Platelets: 147 10*3/uL — ABNORMAL LOW (ref 150–400)
RBC: 3.66 MIL/uL — AB (ref 3.87–5.11)
RDW: 14.5 % (ref 11.5–15.5)
WBC: 4.9 10*3/uL (ref 4.0–10.5)

## 2017-07-28 NOTE — Plan of Care (Signed)
Pt refusing PO intake at this time.  Will continue to encourage.

## 2017-07-28 NOTE — Progress Notes (Signed)
Patient ID: Tanya White, female   DOB: April 01, 1936, 81 y.o.   MRN: [id number]    PROGRESS NOTE  Tanya White  NAT:[id number] DOB: 1936-06-21 DOA: 07/25/2017  PCP: [name], MD   Brief Narrative:  81 y.o. female with medical history significant of seizure disorder, history of pulmonary embolism, TTP, prior stroke who presents to the emergency department from nursing facility with witnessed pain and ? seizure episodes.  Pt was confused and lethargic in ED, started on Rocephin for presumptive UTI, also started on Vimpat IV.   Assessment & Plan:   Principal Problem:   Sepsis secondary to UTI ([place]) / Leukocytosis  - cultures with E. Coli sensitive to Rocephin  - continue Rocephin for now  Active Problems:   Acute metabolic encephalopathy - possibly from UTI vs Seizures, hypernatremia  - EEG consistent with prior infarction but no acute findings  - continue vimpat and depakote     Hypernatremia - pre renal in etiology - resolved     Hyperkalemia - resolved     Essential hypertension - Continue metoprolol     Pulmonary embolism (HCC) - continue apixaban     Acute kidney injury - pre renal - resolved     H/O: stroke with residual effects   History of TTP (thrombotic thrombocytopenic purpura)  DVT prophylaxis: pt on apixaban  Code Status: DNR/DNI Family Communication: no family at the bedside this am Disposition Plan: to SNF likely in 24-48 hours   Consultants:   None   Procedures:   EEG 12/11 --> findings consistent with old infarction   Antimicrobials:   Rocephin 12/10 -->  Subjective: No overnight events.   Objective: Vitals:   07/27/17 1327 07/27/17 1400 07/27/17 2215 07/28/17 0541  BP: (!) 145/84  (!) 127/54 (!) 168/81  Pulse:  95 95 71  Resp:  18 19 18   Temp:  98.5 F (36.9 C) 98.7 F (37.1 C) 98.6 F (37 C)  TempSrc:  Axillary Axillary Axillary  SpO2:  95% 100% 100%  Weight:      Height:        Intake/Output Summary (Last 24  hours) at 07/28/2017 0606 Last data filed at 07/28/2017 0542 Gross per 24 hour  Intake 1902.5 ml  Output 450 ml  Net 1452.5 ml   Filed Weights   07/25/17 1102 07/25/17 1852  Weight: 46 kg (101 lb 8 oz) 38.5 kg (84 lb 14 oz)    Physical Exam  Constitutional: Appears to be in no distress    CVS: Rate controlled, S1/S2 + Pulmonary: Effort and breath sounds normal, no stridor  Abdominal: Soft. BS +,  no distension, tenderness, rebound or guarding.  Musculoskeletal: No edema and no tenderness.  Lymphadenopathy: No lymphadenopathy noted, cervical, inguinal. Neuro: little drowsy this am, No cranial nerve deficit. Skin: Skin is warm and dry. No rash noted.  Psychiatric: Not restless or agitated    Data Reviewed: I have personally reviewed following labs and imaging studies  CBC: Recent Labs  Lab 07/25/17 1130 07/26/17 0514 07/27/17 0430  WBC 12.9* 7.8 5.0  NEUTROABS 11.4*  --   --   HGB 11.6* 11.1* 10.4*  HCT 33.5* 31.5* 30.5*  MCV 88.4 87.0 88.4  PLT 161 130* 427   Basic Metabolic Panel: Recent Labs  Lab 07/25/17 1130 07/26/17 0514 07/27/17 0430  NA 146* 140 140  K 4.5 5.9* 3.8  CL 112* 110 111  CO2 25 24 25   GLUCOSE 90 71 82  BUN 26* 18 17  CREATININE  1.60* 1.09* 0.74  CALCIUM 9.0 7.8* 8.3*   Liver Function Tests: Recent Labs  Lab 07/25/17 1130 07/26/17 0514  AST 27 43*  ALT 10* 14  ALKPHOS 48 41  BILITOT 0.6 0.8  PROT 6.7 5.6*  ALBUMIN 3.7 2.9*   Urine analysis:    Component Value Date/Time   COLORURINE YELLOW 07/25/2017 1144   APPEARANCEUR HAZY (A) 07/25/2017 1144   LABSPEC 1.013 07/25/2017 1144   PHURINE 6.0 07/25/2017 1144   GLUCOSEU NEGATIVE 07/25/2017 1144   HGBUR NEGATIVE 07/25/2017 1144   [place] 07/25/2017 1144   KETONESUR NEGATIVE 07/25/2017 1144   PROTEINUR 100 (A) 07/25/2017 1144   UROBILINOGEN 0.2 06/08/2015 2149   NITRITE POSITIVE (A) 07/25/2017 1144   LEUKOCYTESUR LARGE (A) 07/25/2017 1144   Recent Results (from  the past 240 hour(s))  Blood Culture (routine x 2)     Status: None (Preliminary result)   Collection Time: 07/25/17 11:32 AM  Result Value Ref Range Status   Specimen Description BLOOD BLOOD RIGHT ARM  Final   Special Requests   Final    BOTTLES DRAWN AEROBIC AND ANAEROBIC Blood Culture results may not be optimal due to an inadequate volume of blood received in culture bottles   Culture   Final    NO GROWTH 2 DAYS Performed at [facility], [place] [address], [place], [place] [address]    Report Status PENDING  Incomplete  Blood Culture (routine x 2)     Status: None (Preliminary result)   Collection Time: 07/25/17 11:39 AM  Result Value Ref Range Status   Specimen Description BLOOD BLOOD LEFT ARM  Final   Special Requests IN PEDIATRIC BOTTLE Blood Culture adequate volume  Final   Culture   Final    NO GROWTH 2 DAYS Performed at [facility], [place] [address] [place], [place] [address]    Report Status PENDING  Incomplete  Urine culture     Status: Abnormal   Collection Time: 07/25/17 11:44 AM  Result Value Ref Range Status   Specimen Description URINE, CATHETERIZED  Final   Special Requests NONE  Final   Culture >=100,000 COLONIES/mL ESCHERICHIA COLI (A)  Final   Report Status 07/27/2017 FINAL  Final   Organism ID, Bacteria ESCHERICHIA COLI (A)  Final      Susceptibility   Escherichia coli - MIC*    AMPICILLIN >=32 RESISTANT Resistant     CEFAZOLIN <=4 SENSITIVE Sensitive     CEFTRIAXONE <=1 SENSITIVE Sensitive     CIPROFLOXACIN >=4 RESISTANT Resistant     GENTAMICIN <=1 SENSITIVE Sensitive     IMIPENEM <=0.25 SENSITIVE Sensitive     NITROFURANTOIN <=16 SENSITIVE Sensitive     TRIMETH/SULFA >=320 RESISTANT Resistant     AMPICILLIN/SULBACTAM 4 SENSITIVE Sensitive     PIP/TAZO <=4 SENSITIVE Sensitive     Extended ESBL NEGATIVE Sensitive     * >=100,000 COLONIES/mL ESCHERICHIA COLI  MRSA PCR Screening     Status: Abnormal   Collection Time: 07/25/17  7:15 PM    Result Value Ref Range Status   MRSA by PCR POSITIVE (A) NEGATIVE Final    Comment:        The GeneXpert MRSA Assay (FDA approved for NASAL specimens only), is one component of a comprehensive MRSA colonization surveillance program. It is not intended to diagnose MRSA infection nor to guide or monitor treatment for MRSA infections. RESULT CALLED TO, READ BACK BY AND VERIFIED WITH: BROWN,D RN 12.10.18 @2106  ZANDO,C  Radiology Studies: No results found. Scheduled Meds: . apixaban  2.5 mg Oral BID  . chlorhexidine  15 mL Mouth Rinse BID  . Chlorhexidine Gluconate Cloth  6 each Topical Q0600  . feeding supplement (ENSURE ENLIVE)  237 mL Oral TID BM  . mouth rinse  15 mL Mouth Rinse q12n4p  . metoprolol tartrate  5 mg Intravenous Q6H  . mupirocin ointment  1 application Nasal BID   Continuous Infusions: . sodium chloride 75 mL/hr at 07/27/17 0304  . cefTRIAXone (ROCEPHIN)  IV Stopped (07/27/17 1748)  . lacosamide (VIMPAT) IV Stopped (07/28/17 0145)  . valproate sodium Stopped (07/28/17 0338)     LOS: 3 days   Time spent: 25 minutes   Faye Ramsay, MD [facility] Pager [phone]  If 7PM-7AM, please contact night-coverage [email or web address] Password [email or web address] 07/28/2017, 6:06 AM

## 2017-07-28 NOTE — Care Management Important Message (Addendum)
Important Message  Patient Details IM Letter given to Cookie/Case Manager to present to the atient Name: Tanya White MRN: [id number] Date of Birth: 02-07-1936   Medicare Important Message Given:  Yes    Kerin Salen 07/28/2017, 12:01 [place] Message  Patient Details  Name: Tanya White MRN: [id number] Date of Birth: [birth year]   Medicare Important Message Given:  Yes    Kerin Salen 07/28/2017, 12:01 PM

## 2017-07-28 NOTE — Progress Notes (Signed)
Continues to refuse ALL PO meds including Eliquis.  Md was made aware of this by day RN

## 2017-07-29 DIAGNOSIS — I2699 Other pulmonary embolism without acute cor pulmonale: Secondary | ICD-10-CM

## 2017-07-29 DIAGNOSIS — I6930 Unspecified sequelae of cerebral infarction: Secondary | ICD-10-CM

## 2017-07-29 LAB — BASIC METABOLIC PANEL
ANION GAP: 6 (ref 5–15)
BUN: 9 mg/dL (ref 6–20)
CO2: 28 mmol/L (ref 22–32)
Calcium: 8.3 mg/dL — ABNORMAL LOW (ref 8.9–10.3)
Chloride: 106 mmol/L (ref 101–111)
Creatinine, Ser: 0.64 mg/dL (ref 0.44–1.00)
GLUCOSE: 89 mg/dL (ref 65–99)
POTASSIUM: 3.5 mmol/L (ref 3.5–5.1)
SODIUM: 140 mmol/L (ref 135–145)

## 2017-07-29 LAB — CBC
HCT: 29.8 % — ABNORMAL LOW (ref 36.0–46.0)
Hemoglobin: 10.4 g/dL — ABNORMAL LOW (ref 12.0–15.0)
MCH: 30.1 pg (ref 26.0–34.0)
MCHC: 34.9 g/dL (ref 30.0–36.0)
MCV: 86.4 fL (ref 78.0–100.0)
PLATELETS: 142 10*3/uL — AB (ref 150–400)
RBC: 3.45 MIL/uL — AB (ref 3.87–5.11)
RDW: 14.4 % (ref 11.5–15.5)
WBC: 4.8 10*3/uL (ref 4.0–10.5)

## 2017-07-29 MED ORDER — CEPHALEXIN 500 MG PO CAPS: 500.0000 mg | ORAL_CAPSULE | Freq: Two times a day (BID) | ORAL | Status: DC

## 2017-07-29 NOTE — Plan of Care (Signed)
.   Education: Knowledge of General Education information will improve 07/29/2017 [phone] - Not Progressing by Lakedra Washington, Royetta Crochet, RN Note Unable to assess

## 2017-07-29 NOTE — Progress Notes (Signed)
PROGRESS NOTE  Tanya White  VEL:[id number] DOB: 07-26-1936 DOA: 07/25/2017 PCP: [name], MD   Brief Narrative: 81 y.o.femalewith medical history significant ofseizure disorder, history of pulmonary embolism, TTP, prior stroke who presents to the emergency department from nursing facility with witnessed pain and ? seizure episodes. Pt was confused and lethargic in ED, started on Rocephin for presumptive UTI, also started on Vimpat IV. Her oral intake has improved, mental status returned to baseline, though she is refusing oral medications.   Assessment & Plan: Principal Problem:   Sepsis secondary to UTI [facility]) Active Problems:   Memory loss   Pulmonary embolism ([place])   Seizures ([place])   H/O: stroke with residual effects   History of TTP (thrombotic thrombocytopenic purpura)  Sepsis secondary to E. coli UTI: Completed 5 days of IV antibiotics, will complete course with keflex 500mg  po BID x 2 more days per sensitivity report.  Acute metabolic encephalopathy on chronic dementia: Due to UTI and seizures, since treated/resolved. EEG consistent with prior infarction but no acute findings    Seizure disorder with seizures in setting of infection and nonadherence to AEDs.  - Continue vimpat and depakote   Hypernatremia: Mild, resolved. Suspect prerenal/dehydration.  - Suggest BMP in 1 week.   Hyperkalemia: Resolved.   Essential hypertension: Chronic, stable, at age-specific goal. - Continue metoprolol   History of PE: No current issues.  - Continue eliquis.   AKI: Resolved  History of CVA with residual effects.  - Total care  Thrombocytopenia with history of TTP: No current issues. - Monitor CBC in 1 week. Platelets stable at 142 today.   DVT prophylaxis: Eliquis Code Status: DNR confirmed with HCPOA Family Communication: Nephew at bedside Disposition Plan: SNF if stable in AM  Consultants:   None   Procedures:   EEG 12/11 --> findings consistent  with old infarction   Antimicrobials:   Rocephin 12/10 - 12/14  Keflex 12/15 - 12/16  Subjective: Continues to take nearly no medications. This is a recurring problem at SNF. Mental status is at baseline per Son.   Objective: Vitals:   07/28/17 2255 07/29/17 0442 07/29/17 1231 07/29/17 1451  BP: (!) 160/83 (!) 149/0 (!) 142/102   Pulse: 94 65  72  Resp: 18 18  18   Temp: 98.9 F (37.2 C) 97.7 F (36.5 C)  98.5 F (36.9 C)  TempSrc: Oral Axillary  Oral  SpO2: 98% 98%  99%  Weight:      Height:        Intake/Output Summary (Last 24 hours) at 07/29/2017 1743 Last data filed at 07/29/2017 1700 Gross per 24 hour  Intake 3180 ml  Output -  Net 3180 ml   Filed Weights   07/25/17 1102 07/25/17 1852  Weight: 46 kg (101 lb 8 oz) 38.5 kg (84 lb 14 oz)    Gen: Thin elderly female in no distress Pulm: Non-labored breathing room air. Clear to auscultation bilaterally.  CV: Regular rate and rhythm. No murmur, rub, or gallop. No JVD, no pedal edema. GI: Abdomen soft, non-tender, non-distended, with normoactive bowel sounds. No organomegaly or masses felt. Ext: Warm, no deformities Skin: No rashes, lesions no ulcers Neuro: Alert not oriented. Not cooperative with exam. Aphasia evident, at baseline per nephew.  Psych: Judgement and insight appear impaired. Mood & affect appropriate.   Data Reviewed: I have personally reviewed following labs and imaging studies  CBC: Recent Labs  Lab 07/25/17 1130 07/26/17 0514 07/27/17 0430 07/28/17 0701 07/29/17 0424  WBC 12.9*  7.8 5.0 4.9 4.8  NEUTROABS 11.4*  --   --   --   --   HGB 11.6* 11.1* 10.4* 11.2* 10.4*  HCT 33.5* 31.5* 30.5* 31.8* 29.8*  MCV 88.4 87.0 88.4 86.9 86.4  PLT 161 130* 161 147* 562*   Basic Metabolic Panel: Recent Labs  Lab 07/25/17 1130 07/26/17 0514 07/27/17 0430 07/28/17 0701 07/29/17 0424  NA 146* 140 140 140 140  K 4.5 5.9* 3.8 3.3* 3.5  CL 112* 110 111 107 106  CO2 25 24 25 27 28   GLUCOSE 90 71  82 85 89  BUN 26* 18 17 11 9   CREATININE 1.60* 1.09* 0.74 0.67 0.64  CALCIUM 9.0 7.8* 8.3* 8.4* 8.3*   GFR: Estimated Creatinine Clearance: 34.7 mL/min (by C-G formula based on SCr of 0.64 mg/dL). Liver Function Tests: Recent Labs  Lab 07/25/17 1130 07/26/17 0514  AST 27 43*  ALT 10* 14  ALKPHOS 48 41  BILITOT 0.6 0.8  PROT 6.7 5.6*  ALBUMIN 3.7 2.9*   No results for input(s): LIPASE, AMYLASE in the last 168 hours. No results for input(s): AMMONIA in the last 168 hours. Coagulation Profile: No results for input(s): INR, PROTIME in the last 168 hours. Cardiac Enzymes: No results for input(s): CKTOTAL, CKMB, CKMBINDEX, TROPONINI in the last 168 hours. BNP (last 3 results) No results for input(s): PROBNP in the last 8760 hours. HbA1C: No results for input(s): HGBA1C in the last 72 hours. CBG: No results for input(s): GLUCAP in the last 168 hours. Lipid Profile: No results for input(s): CHOL, HDL, LDLCALC, TRIG, CHOLHDL, LDLDIRECT in the last 72 hours. Thyroid Function Tests: No results for input(s): TSH, T4TOTAL, FREET4, T3FREE, THYROIDAB in the last 72 hours. Anemia Panel: No results for input(s): VITAMINB12, FOLATE, FERRITIN, TIBC, IRON, RETICCTPCT in the last 72 hours. Urine analysis:    Component Value Date/Time   COLORURINE YELLOW 07/25/2017 1144   APPEARANCEUR HAZY (A) 07/25/2017 1144   LABSPEC 1.013 07/25/2017 1144   PHURINE 6.0 07/25/2017 1144   GLUCOSEU NEGATIVE 07/25/2017 1144   HGBUR NEGATIVE 07/25/2017 [place] 07/25/2017 1144   KETONESUR NEGATIVE 07/25/2017 1144   PROTEINUR 100 (A) 07/25/2017 1144   UROBILINOGEN 0.2 06/08/2015 2149   NITRITE POSITIVE (A) 07/25/2017 1144   LEUKOCYTESUR LARGE (A) 07/25/2017 1144   Recent Results (from the past 240 hour(s))  Blood Culture (routine x 2)     Status: None (Preliminary result)   Collection Time: 07/25/17 11:32 AM  Result Value Ref Range Status   Specimen Description BLOOD BLOOD RIGHT ARM   Final   Special Requests   Final    BOTTLES DRAWN AEROBIC AND ANAEROBIC Blood Culture results may not be optimal due to an inadequate volume of blood received in culture bottles   Culture   Final    NO GROWTH 4 DAYS Performed at [facility], [place] [address] [place], [place] [address]    Report Status PENDING  Incomplete  Blood Culture (routine x 2)     Status: None (Preliminary result)   Collection Time: 07/25/17 11:39 AM  Result Value Ref Range Status   Specimen Description BLOOD BLOOD LEFT ARM  Final   Special Requests IN PEDIATRIC BOTTLE Blood Culture adequate volume  Final   Culture   Final    NO GROWTH 4 DAYS Performed at [facility], [place] [address], [place], [place] [address]    Report Status PENDING  Incomplete  Urine culture  Status: Abnormal   Collection Time: 07/25/17 11:44 AM  Result Value Ref Range Status   Specimen Description URINE, CATHETERIZED  Final   Special Requests NONE  Final   Culture >=100,000 COLONIES/mL ESCHERICHIA COLI (A)  Final   Report Status 07/27/2017 FINAL  Final   Organism ID, Bacteria ESCHERICHIA COLI (A)  Final      Susceptibility   Escherichia coli - MIC*    AMPICILLIN >=32 RESISTANT Resistant     CEFAZOLIN <=4 SENSITIVE Sensitive     CEFTRIAXONE <=1 SENSITIVE Sensitive     CIPROFLOXACIN >=4 RESISTANT Resistant     GENTAMICIN <=1 SENSITIVE Sensitive     IMIPENEM <=0.25 SENSITIVE Sensitive     NITROFURANTOIN <=16 SENSITIVE Sensitive     TRIMETH/SULFA >=320 RESISTANT Resistant     AMPICILLIN/SULBACTAM 4 SENSITIVE Sensitive     PIP/TAZO <=4 SENSITIVE Sensitive     Extended ESBL NEGATIVE Sensitive     * >=100,000 COLONIES/mL ESCHERICHIA COLI  MRSA PCR Screening     Status: Abnormal   Collection Time: 07/25/17  7:15 PM  Result Value Ref Range Status   MRSA by PCR POSITIVE (A) NEGATIVE Final    Comment:        The GeneXpert MRSA Assay (FDA approved for NASAL specimens only), is one component of a comprehensive MRSA  colonization surveillance program. It is not intended to diagnose MRSA infection nor to guide or monitor treatment for MRSA infections. RESULT CALLED TO, READ BACK BY AND VERIFIED WITH: BROWN,D RN 12.10.18 @2106  ZANDO,C       Radiology Studies: No results found.  Scheduled Meds: . apixaban  2.5 mg Oral BID  . chlorhexidine  15 mL Mouth Rinse BID  . Chlorhexidine Gluconate Cloth  6 each Topical Q0600  . feeding supplement (ENSURE ENLIVE)  237 mL Oral TID BM  . mouth rinse  15 mL Mouth Rinse q12n4p  . metoprolol tartrate  5 mg Intravenous Q6H  . mupirocin ointment  1 application Nasal BID   Continuous Infusions: . sodium chloride 75 mL/hr at 07/29/17 1700  . cefTRIAXone (ROCEPHIN)  IV Stopped (07/29/17 1634)  . lacosamide (VIMPAT) IV Stopped (07/29/17 1152)  . valproate sodium Stopped (07/29/17 1101)     LOS: 4 days   Time spent: 15 minutes.  Vance Gather, MD [facility] Pager [phone]  If 7PM-7AM, please contact night-coverage [email or web address] Password [facility] 07/29/2017, 5:43 PM

## 2017-07-29 NOTE — Care Management Note (Signed)
Case Management Note  Patient Details  Name: Tanya White MRN: [id number] Date of Birth: 1936/03/14  Subjective/Objective:      Pt admitted with Sepsis secondary to UTI/Leukocytosis           Action/Plan: Plan to discharge back to SNF [facility]   Expected Discharge Date:  (unknown)               Expected Discharge Plan:  Skilled Nursing Facility  In-House Referral:  Clinical Social Work  Discharge planning Services  CM Consult  Post Acute Care Choice:    Choice offered to:     DME Arranged:    DME Agency:     HH Arranged:    [place] Agency:     Status of Service:  In process, will continue to follow  If discussed at Long Length of Stay Meetings, dates discussed:    Additional CommentsPurcell Mouton, RN 07/29/2017, 3:09 PM

## 2017-07-30 DIAGNOSIS — G40909 Epilepsy, unspecified, not intractable, without status epilepticus: Secondary | ICD-10-CM

## 2017-07-30 DIAGNOSIS — R569 Unspecified convulsions: Secondary | ICD-10-CM

## 2017-07-30 LAB — CULTURE, BLOOD (ROUTINE X 2)
CULTURE: NO GROWTH
CULTURE: NO GROWTH
Special Requests: ADEQUATE

## 2017-07-30 MED ORDER — CEPHALEXIN 500 MG PO CAPS: 500.0000 mg | ORAL_CAPSULE | Freq: Two times a day (BID) | ORAL | 0 refills | Status: AC

## 2017-07-30 MED ORDER — LORAZEPAM 2 MG/ML IJ SOLN: 1.0000 mg | Freq: Four times a day (QID) | INTRAMUSCULAR | 0 refills | Status: AC | PRN

## 2017-07-30 NOTE — Progress Notes (Signed)
LCSW following for SNF discharge today: [facility]  LCSW confirmed patient to return today. [facility] agreeable. All DC paperwork completed and sent. MD signed DNR and updated FL2. Nephew notified by RN and LCSW arranged for EMS.  Lane Hacker, MSW Clinical Social Work: [employer] Coverage for :  [phone]

## 2017-07-30 NOTE — [place] FL2 (Signed)
[place] MEDICAID FL2 LEVEL OF CARE SCREENING TOOL     IDENTIFICATION  Patient Name: Tanya White Birthdate: October 19, 1935 Sex: female Admission Date (Current Location): 07/25/2017  [facility] and [place] Number:  Herbalist and Address:  [facility],  [place] [address], [place]      Provider Number: [id number]  Attending Physician Name and Address:  Patrecia Pour, MD  Relative Name and Phone Number:       Current Level of Care: Hospital Recommended Level of Care: [place] Prior Approval Number:    Date Approved/Denied:   PASRR Number:    Discharge Plan: SNF    Current Diagnoses: Patient Active Problem List   Diagnosis Date Noted  . Sepsis secondary to UTI ([place]) 07/25/2017  . History of TTP (thrombotic thrombocytopenic purpura) 07/25/2017  . Fracture, intertrochanteric, right femur ([place]) 08/21/2015  . Urinary tract infection due to Proteus 08/20/2015  . Pelvic ring fracture ([place])   . Closed right hip fracture ([place]) 08/18/2015  . H/O: stroke with residual effects 08/18/2015  . AKI (acute kidney injury) ([place]) 08/18/2015  . Fracture of multiple pubic rami ([place]) 08/18/2015  . Aortic atherosclerosis ([place]) 08/18/2015  . Fall 08/18/2015  . Seizures ([place]) 03/09/2015  . Dysphasia 07/16/2014  . Pulmonary embolism ([place])   . Gastrostomy tube in place [facility])   . Memory loss     Orientation RESPIRATION BLADDER Height & Weight     Self  Normal Incontinent Weight: 84 lb 14 oz (38.5 kg) Height:  5' (152.4 cm)  BEHAVIORAL SYMPTOMS/MOOD NEUROLOGICAL BOWEL NUTRITION STATUS      Incontinent Diet(as tolerated per DC orders)  AMBULATORY STATUS COMMUNICATION OF NEEDS Skin   Extensive Assist Verbally Normal                       Personal Care Assistance Level of Assistance  Bathing, Feeding, Dressing Bathing Assistance: Maximum assistance Feeding assistance: Limited assistance Dressing Assistance: Maximum assistance      Functional Limitations Info  Sight, Hearing, Speech Sight Info: Adequate Hearing Info: Adequate Speech Info: Adequate    SPECIAL CARE FACTORS FREQUENCY                       Contractures Contractures Info: Not present    Additional Factors Info  Code Status, Allergies, Isolation Precautions Code Status Info: DNR Allergies Info: Codeine, Lipitor Atorvastatin, Aspirin, Hctz Hydrochlorothiazide, Hydralazine, Reserpine, Tramadol     Isolation Precautions Info: contact for MRSA     Current Medications (07/30/2017):  This is the current hospital active medication list Current Facility-Administered Medications  Medication Dose Route Frequency Provider Last Rate Last Dose  . 0.9 %  sodium chloride infusion   Intravenous Continuous Theodis Blaze, MD 75 mL/hr at 07/29/17 2124    . acetaminophen (TYLENOL) tablet 650 mg  650 mg Oral Q6H PRN Donne Hazel, MD       Or  . acetaminophen (TYLENOL) suppository 650 mg  650 mg Rectal Q6H PRN Donne Hazel, MD      . apixaban Arne [place]) tablet 2.5 mg  2.5 mg Oral BID Donne Hazel, MD   2.5 mg at 07/29/17 2124  . cephALEXin (KEFLEX) capsule 500 mg  500 mg Oral Q12H Patrecia Pour, MD      . chlorhexidine (PERIDEX) 0.12 % solution 15 mL  15 mL Mouth Rinse BID Donne Hazel, MD   15 mL at 07/29/17 2125  .  Chlorhexidine Gluconate Cloth 2 % PADS 6 each  6 each Topical Q0600 Donne Hazel, MD   6 each at 07/29/17 [phone]  . feeding supplement (ENSURE ENLIVE) (ENSURE ENLIVE) liquid 237 mL  237 mL Oral TID BM Theodis Blaze, MD   237 mL at 07/29/17 2000  . lacosamide (VIMPAT) 200 mg in sodium chloride 0.9 % 25 mL IVPB  200 mg Intravenous Q12H Theodis Blaze, MD   Stopped at 07/30/17 0002  . LORazepam (ATIVAN) injection 2 mg  2 mg Intravenous Q4H PRN Donne Hazel, MD      . MEDLINE mouth rinse  15 mL Mouth Rinse q12n4p Donne Hazel, MD   15 mL at 07/29/17 1604  . metoprolol tartrate (LOPRESSOR) injection 5 mg  5 mg Intravenous Q6H  Donne Hazel, MD   5 mg at 07/30/17 [phone]  . ondansetron (ZOFRAN) tablet 4 mg  4 mg Oral Q4H PRN Theodis Blaze, MD      . valproate (DEPACON) 250 mg in dextrose 5 % 50 mL IVPB  250 mg Intravenous Q12H Theodis Blaze, MD   Stopped at 07/30/17 1025     Discharge Medications: Please see discharge summary for a list of discharge medications.  Relevant Imaging Results:  Relevant Lab Results:   Additional Information SS # [id number]. MRSA + surgical pcr 08/19/15  [place], LCSW

## 2017-07-30 NOTE — Discharge Summary (Signed)
Physician Discharge Summary  Carel Schnee ZDG:[id number] DOB: [birth year] DOA: 07/25/2017  PCP: [name], MD  Admit date: 07/25/2017 Discharge date: 07/30/2017  Admitted From: SNF Disposition: SNF   Recommendations for Outpatient Follow-up:  1. Follow up adherence to antiepileptic medications.   Home Health: N/A Equipment/Devices: None new Discharge Condition: Stable for transport CODE STATUS: DNR Diet recommendation: As tolerated  Brief/Interim Summary: 81 y.o.femalewith medical history significant ofseizure disorder, history of pulmonary embolism, TTP, prior stroke who presents to the emergency department from nursing facility with witnessed pain and ? seizure episodes. Pt was confused and lethargic in ED, started on Rocephin for presumptive UTI, also started on Vimpat IV. Her oral intake has improved, mental status returned to baseline, though she is intermittently refusing oral medications. After discussion with her son, Chauncey Reading, this seems to be a chronic situation. In alignment with goals of care, despite the high risk of seizures/complications with medication nonadherence, there has not been forced administration of medications.   Discharge Diagnoses:  Principal Problem:   Sepsis secondary to UTI [facility]) Active Problems:   Memory loss   Pulmonary embolism ([place])   Seizures ([place])   H/O: stroke with residual effects   History of TTP (thrombotic thrombocytopenic purpura)  Sepsis secondary to E. coli UTI: Completed 5 days of IV antibiotics, will complete course with keflex 500mg  po BID x 2 more days per sensitivity report.   Rocephin 12/10 - 12/14  Keflex 12/15 - 12/16 - Continue trimethoprim suppression as previously.  Acute metabolic encephalopathy on chronic dementia: Due to UTI and seizures, since treated/resolved. EEG consistent with prior infarction but no acute findings   Seizure disorder with seizures in setting of infection and nonadherence to AEDs.  -  Continue vimpat and depakote  Hypernatremia: Mild, resolved. Suspect prerenal/dehydration.  - Suggest BMP in 1 week.   Hyperkalemia: Resolved.   Essential hypertension: Chronic, stable, at age-specific goal. - Continue metoprolol  History of PE: No current issues.  - Continue eliquis.   AKI: Resolved  History of CVA with residual effects.  - Total care  Thrombocytopenia with history of TTP: No current issues. - Monitor CBC in 1 week. Platelets stable at 142.  Discharge Instructions  Allergies as of 07/30/2017      Reactions   Codeine Shortness Of Breath   Lipitor [atorvastatin] Diarrhea, Other (See Comments)   GI upset, rhabdomyalosis   Aspirin Diarrhea, Other (See Comments)   Only ASA 325mg  ; pt can tolerate low dose asa 81mg  Reaction : GI upset   Hctz [hydrochlorothiazide] Rash   Hydralazine Other (See Comments)   Unknown reaction - listed on [facility] 06/08/2015   Reserpine Other (See Comments)   Unknown reaction - listed on [facility] 06/08/2015   Tramadol Other (See Comments)   Unknown reaction - listed on [facility] 06/08/2015      Medication List    STOP taking these medications   acetaminophen 325 MG tablet Commonly known as:  TYLENOL   cefUROXime 250 MG tablet Commonly known as:  CEFTIN   docusate sodium 100 MG capsule Commonly known as:  COLACE   methocarbamol 500 MG tablet Commonly known as:  ROBAXIN   mirtazapine 15 MG tablet Commonly known as:  REMERON   polyethylene glycol packet Commonly known as:  MIRALAX / GLYCOLAX   sodium chloride 0.45 % solution   sodium chloride 0.9 % infusion     TAKE these medications   albuterol (2.5 MG/3ML) 0.083% nebulizer solution Commonly known as:  PROVENTIL Take 2.5 mg  by nebulization every 2 (two) hours as needed for wheezing or shortness of breath.   apixaban 2.5 MG Tabs tablet Commonly known as:  ELIQUIS Take 1 tablet (2.5 mg total) by mouth 2 (two) times daily.   cephALEXin 500 MG capsule Commonly known  as:  KEFLEX Take 1 capsule (500 mg total) by mouth every 12 (twelve) hours.   Cranberry 450 MG Tabs Take 1 tablet by mouth daily. What changed:  Another medication with the same name was removed. Continue taking this medication, and follow the directions you see here.   divalproex 250 MG DR tablet Commonly known as:  DEPAKOTE Take 250 mg by mouth 2 (two) times daily.   famotidine 20 MG tablet Commonly known as:  PEPCID Take 20 mg by mouth daily.   feeding supplement Liqd Take 1 Container by mouth 3 (three) times daily between meals. What changed:  when to take this   ferrous sulfate 325 (65 FE) MG tablet Take 1 tablet (325 mg total) by mouth daily.   furosemide 40 MG tablet Commonly known as:  LASIX Take 1 tablet (40 mg total) by mouth daily as needed (for leg swelling.  Give PRN potassium when Lasix is given).   lacosamide 200 MG Tabs tablet Commonly known as:  VIMPAT Take 1 tablet (200 mg total) by mouth 2 (two) times daily. What changed:  Another medication with the same name was removed. Continue taking this medication, and follow the directions you see here.   LORazepam 2 MG/ML injection Commonly known as:  ATIVAN Inject 0.5 mLs (1 mg total) into the muscle every 6 (six) hours as needed for seizure.   magnesium oxide 400 MG tablet Commonly known as:  MAG-OX Take 1 tablet (400 mg total) by mouth daily.   metoprolol tartrate 25 MG tablet Commonly known as:  LOPRESSOR Take 25 mg by mouth 2 (two) times daily.   ondansetron 4 MG tablet Commonly known as:  ZOFRAN Take 1 tablet (4 mg total) by mouth every 4 (four) hours as needed for nausea.   potassium chloride SA 20 MEQ tablet Commonly known as:  KLOR-CON M20 Take 1 tablet (20 mEq total) by mouth daily as needed (whenever she is given Lasix).   PREMARIN vaginal cream Generic drug:  conjugated estrogens Place 1 Applicatorful vaginally as directed. On MWF   senna 8.6 MG Tabs tablet Commonly known as:   SENOKOT Take 2 tablets by mouth 2 (two) times daily.   trimethoprim 100 MG tablet Commonly known as:  TRIMPEX Take 100 mg by mouth daily.   Vitamin D (Ergocalciferol) 50000 units Caps capsule Commonly known as:  DRISDOL Take 50,000 Units by mouth every 30 (thirty) days.      Follow-up Information    [name], MD Follow up.   Specialty:  Family Medicine Contact information: [address] [facility] [place] [address] [phone]          Allergies  Allergen Reactions  . Codeine Shortness Of Breath  . Lipitor [Atorvastatin] Diarrhea and Other (See Comments)    GI upset, rhabdomyalosis   . Aspirin Diarrhea and Other (See Comments)    Only ASA 325mg  ; pt can tolerate low dose asa 81mg  Reaction : GI upset   . Hctz [Hydrochlorothiazide] Rash  . Hydralazine Other (See Comments)    Unknown reaction - listed on [facility] 06/08/2015  . Reserpine Other (See Comments)    Unknown reaction - listed on [facility] 06/08/2015  . Tramadol  Other (See Comments)    Unknown reaction - listed on [facility] 06/08/2015    Consultations:  None  Procedures/Studies: Ct Head Wo Contrast  Result Date: 07/25/2017 CLINICAL DATA:  Seizure. EXAM: CT HEAD WITHOUT CONTRAST TECHNIQUE: Contiguous axial images were obtained from the base of the skull through the vertex without intravenous contrast. COMPARISON:  01/24/2016 FINDINGS: Brain: There is no evidence for acute hemorrhage, hydrocephalus, mass lesion, or abnormal extra-axial fluid collection. No definite CT evidence for acute infarction. Old posterior left MCA territory infarct again noted. Diffuse loss of parenchymal volume is consistent with atrophy. Patchy low attenuation in the deep hemispheric and periventricular white matter is nonspecific, but likely reflects chronic microvascular ischemic demyelination. Vascular: No hyperdense vessel or unexpected calcification. Skull: No evidence for fracture. No worrisome lytic or  sclerotic lesion. Sinuses/Orbits: The visualized paranasal sinuses and mastoid air cells are clear. Visualized portions of the globes and intraorbital fat are unremarkable. Other: None. IMPRESSION: 1. Stable exam.  No acute intracranial abnormality. 2. Atrophy with chronic small vessel white matter ischemic disease. 3. Old posterior left MCA territory infarct. Electronically Signed   By: Misty Stanley M.D.   On: 07/25/2017 17:57   Dg Chest Port 1 View  Result Date: 07/25/2017 CLINICAL DATA:  Seizures, weakness, fever EXAM: PORTABLE CHEST 1 VIEW COMPARISON:  08/18/2015 FINDINGS: There is no focal parenchymal opacity. There is no pleural effusion or pneumothorax. The heart and mediastinal contours are unremarkable. There is old posttraumatic deformity of the right proximal humerus. IMPRESSION: No active disease. Electronically Signed   By: Kathreen Devoid   On: 07/25/2017 11:32   EEG 12/11 -->findings consistent with old infarction    Subjective: No complaints. Denies pain, fever, abdominal pain, N/V/D.   Discharge Exam: Vitals:   07/30/17 0000 07/30/17 0544  BP:  107/68  Pulse: 76 74  Resp:  18  Temp:  97.8 F (36.6 C)  SpO2:  99%   Gen: Thin elderly female in no distress Pulm: Non-labored breathing room air. Clear to auscultation bilaterally.  CV: Regular rate and rhythm. No murmur, rub, or gallop. No JVD, no pedal edema. GI: Abdomen soft, non-tender, non-distended, with normoactive bowel sounds. Neuro: Alert not oriented. Not cooperative with exam. Psych: Judgement and insight appear impaired. Mood & affect appropriate.   Labs: BNP (last 3 results) No results for input(s): BNP in the last 8760 hours. Basic Metabolic Panel: Recent Labs  Lab 07/25/17 1130 07/26/17 0514 07/27/17 0430 07/28/17 0701 07/29/17 0424  NA 146* 140 140 140 140  K 4.5 5.9* 3.8 3.3* 3.5  CL 112* 110 111 107 106  CO2 25 24 25 27 28   GLUCOSE 90 71 82 85 89  BUN 26* 18 17 11 9   CREATININE 1.60* 1.09*  0.74 0.67 0.64  CALCIUM 9.0 7.8* 8.3* 8.4* 8.3*   Liver Function Tests: Recent Labs  Lab 07/25/17 1130 07/26/17 0514  AST 27 43*  ALT 10* 14  ALKPHOS 48 41  BILITOT 0.6 0.8  PROT 6.7 5.6*  ALBUMIN 3.7 2.9*   No results for input(s): LIPASE, AMYLASE in the last 168 hours. No results for input(s): AMMONIA in the last 168 hours. CBC: Recent Labs  Lab 07/25/17 1130 07/26/17 0514 07/27/17 0430 07/28/17 0701 07/29/17 0424  WBC 12.9* 7.8 5.0 4.9 4.8  NEUTROABS 11.4*  --   --   --   --   HGB 11.6* 11.1* 10.4* 11.2* 10.4*  HCT 33.5* 31.5* 30.5* 31.8* 29.8*  MCV 88.4 87.0 88.4 86.9 86.4  PLT 161 130* 161 147* 142*   Cardiac Enzymes: No results for input(s): CKTOTAL, CKMB, CKMBINDEX, TROPONINI in the last 168 hours. BNP: Invalid input(s): POCBNP CBG: No results for input(s): GLUCAP in the last 168 hours. D-Dimer No results for input(s): DDIMER in the last 72 hours. Hgb A1c No results for input(s): HGBA1C in the last 72 hours. Lipid Profile No results for input(s): CHOL, HDL, LDLCALC, TRIG, CHOLHDL, LDLDIRECT in the last 72 hours. Thyroid function studies No results for input(s): TSH, T4TOTAL, T3FREE, THYROIDAB in the last 72 hours.  Invalid input(s): FREET3 Anemia work up No results for input(s): VITAMINB12, FOLATE, FERRITIN, TIBC, IRON, RETICCTPCT in the last 72 hours. Urinalysis    Component Value Date/Time   COLORURINE YELLOW 07/25/2017 1144   APPEARANCEUR HAZY (A) 07/25/2017 1144   LABSPEC 1.013 07/25/2017 1144   PHURINE 6.0 07/25/2017 1144   GLUCOSEU NEGATIVE 07/25/2017 1144   HGBUR NEGATIVE 07/25/2017 [place] 07/25/2017 1144   KETONESUR NEGATIVE 07/25/2017 1144   PROTEINUR 100 (A) 07/25/2017 1144   UROBILINOGEN 0.2 06/08/2015 2149   NITRITE POSITIVE (A) 07/25/2017 1144   LEUKOCYTESUR LARGE (A) 07/25/2017 1144    Microbiology Recent Results (from the past 240 hour(s))  Blood Culture (routine x 2)     Status: None (Preliminary result)    Collection Time: 07/25/17 11:32 AM  Result Value Ref Range Status   Specimen Description BLOOD BLOOD RIGHT ARM  Final   Special Requests   Final    BOTTLES DRAWN AEROBIC AND ANAEROBIC Blood Culture results may not be optimal due to an inadequate volume of blood received in culture bottles   Culture   Final    NO GROWTH 4 DAYS Performed at [facility], [place] [address] [place], [place] [address]    Report Status PENDING  Incomplete  Blood Culture (routine x 2)     Status: None (Preliminary result)   Collection Time: 07/25/17 11:39 AM  Result Value Ref Range Status   Specimen Description BLOOD BLOOD LEFT ARM  Final   Special Requests IN PEDIATRIC BOTTLE Blood Culture adequate volume  Final   Culture   Final    NO GROWTH 4 DAYS Performed at [facility], [place] [address] [place], [place] [address]    Report Status PENDING  Incomplete  Urine culture     Status: Abnormal   Collection Time: 07/25/17 11:44 AM  Result Value Ref Range Status   Specimen Description URINE, CATHETERIZED  Final   Special Requests NONE  Final   Culture >=100,000 COLONIES/mL ESCHERICHIA COLI (A)  Final   Report Status 07/27/2017 FINAL  Final   Organism ID, Bacteria ESCHERICHIA COLI (A)  Final      Susceptibility   Escherichia coli - MIC*    AMPICILLIN >=32 RESISTANT Resistant     CEFAZOLIN <=4 SENSITIVE Sensitive     CEFTRIAXONE <=1 SENSITIVE Sensitive     CIPROFLOXACIN >=4 RESISTANT Resistant     GENTAMICIN <=1 SENSITIVE Sensitive     IMIPENEM <=0.25 SENSITIVE Sensitive     NITROFURANTOIN <=16 SENSITIVE Sensitive     TRIMETH/SULFA >=320 RESISTANT Resistant     AMPICILLIN/SULBACTAM 4 SENSITIVE Sensitive     PIP/TAZO <=4 SENSITIVE Sensitive     Extended ESBL NEGATIVE Sensitive     * >=100,000 COLONIES/mL ESCHERICHIA COLI  MRSA PCR Screening     Status: Abnormal   Collection Time: 07/25/17  7:15 PM  Result Value Ref Range Status   MRSA by  PCR POSITIVE (A) NEGATIVE Final    Comment:         The GeneXpert MRSA Assay (FDA approved for NASAL specimens only), is one component of a comprehensive MRSA colonization surveillance program. It is not intended to diagnose MRSA infection nor to guide or monitor treatment for MRSA infections. RESULT CALLED TO, READ BACK BY AND VERIFIED WITH: BROWN,D RN 12.10.18 @2106  ZANDO,C     Time coordinating discharge: Approximately 40 minutes  Vance Gather, MD  [facility] 07/30/2017, 11:20 AM Pager [phone]

## 2019-05-06 IMAGING — CT CT HEAD W/O CM
3 series · 15 of 47 positions shown, 18 images · non-contrast
Comparison: 01/24/2016

CLINICAL DATA: Seizure.

EXAM:
CT HEAD WITHOUT CONTRAST
TECHNIQUE: Contiguous axial images were obtained from the base of the skull
through the vertex without intravenous contrast.

[Series 2: head wo · axial · 0.45mm/px · z∈[-88,+52]mm · 9 of 34 slices shown, 12 images]
[im 3/34  brain]
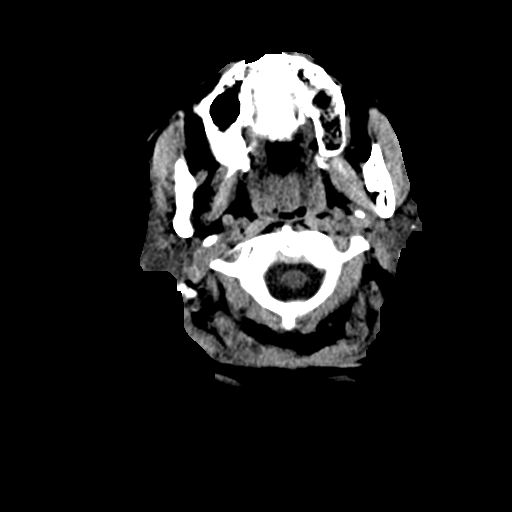
[im 3/34  bone]
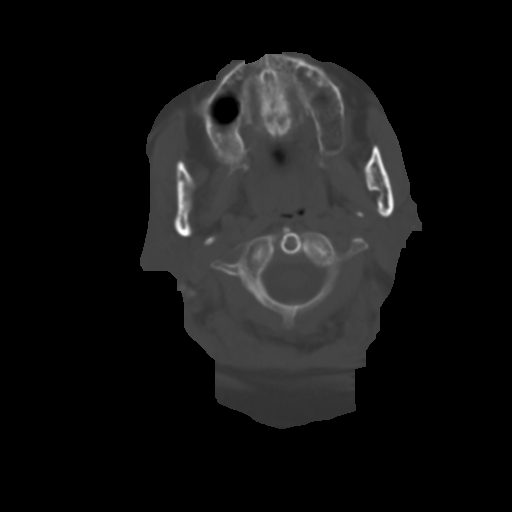
[im 6/34  brain]
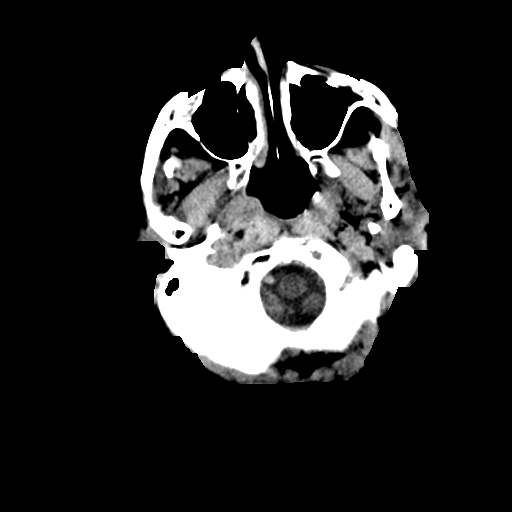
[im 10/34  brain]
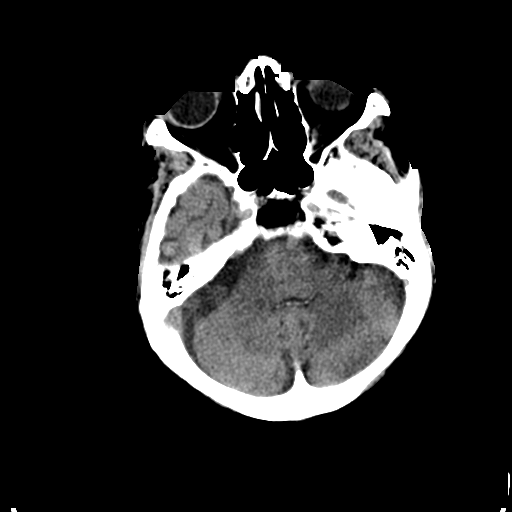
[im 13/34  brain]
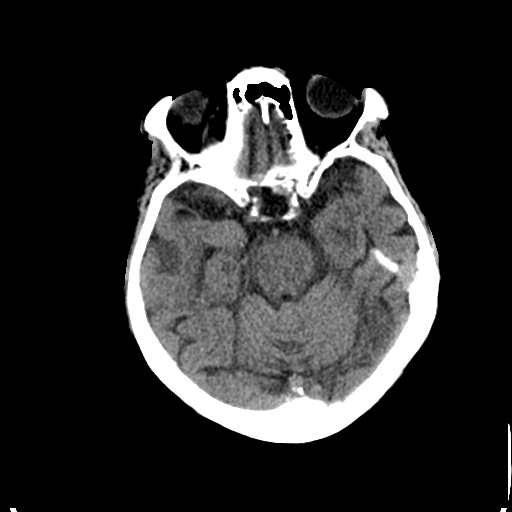
[im 18/34  brain]
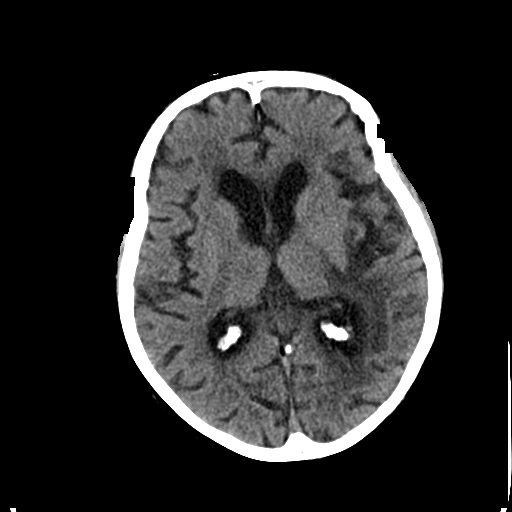
[im 18/34  bone]
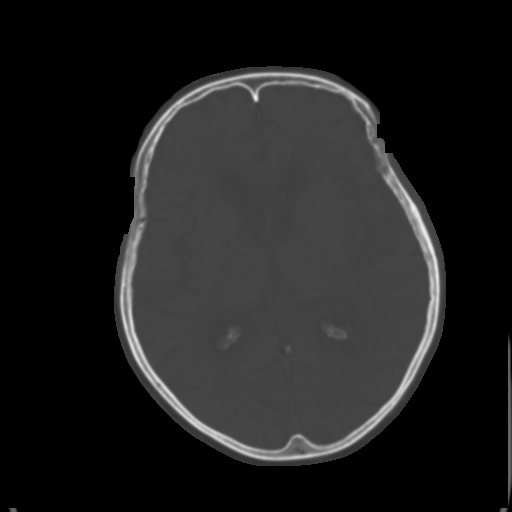
[im 21/34  brain]
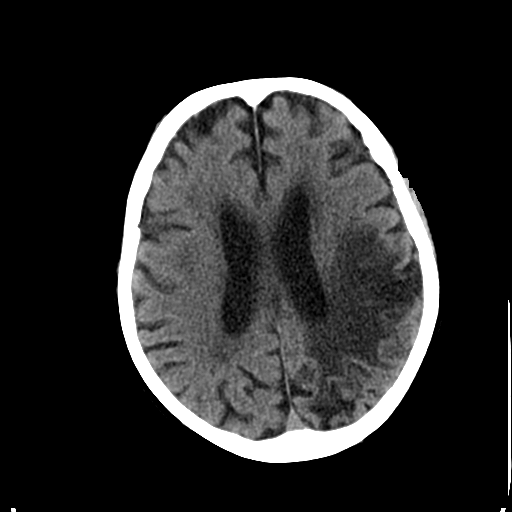
[im 24/34  brain]
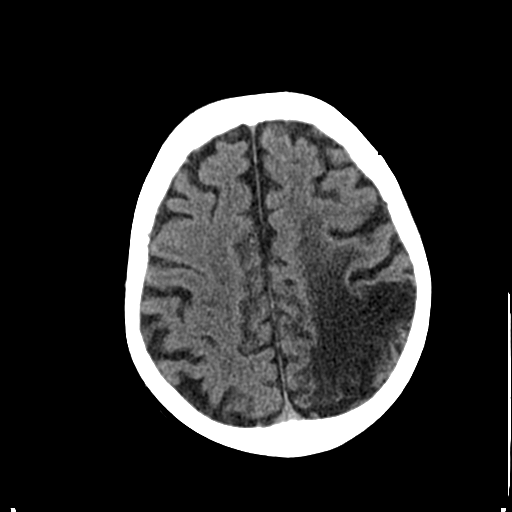
[im 28/34  brain]
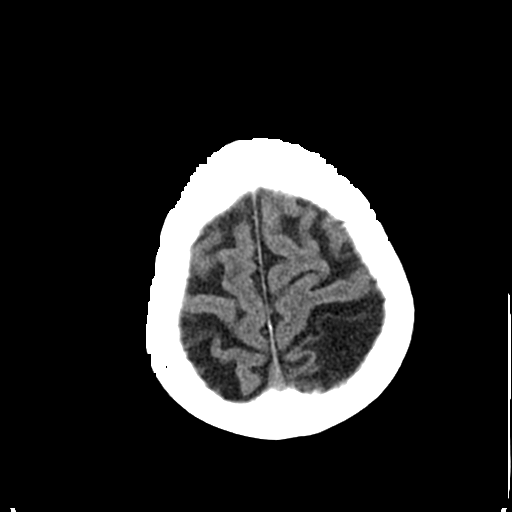
[im 31/34  brain]
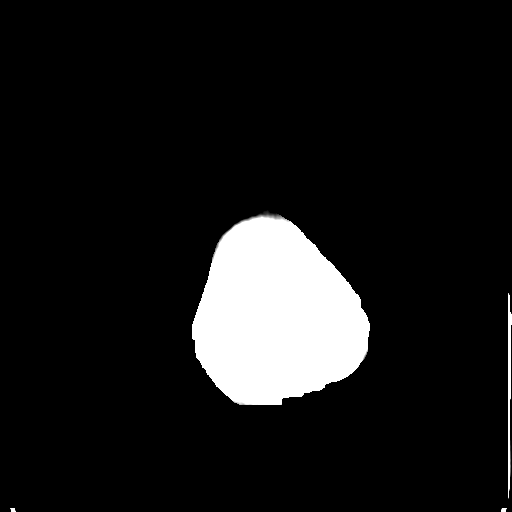
[im 31/34  bone]
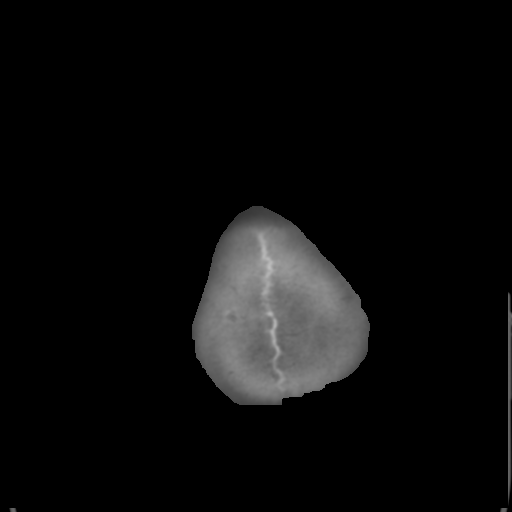

[Series 4: coronal soft tissue · coronal · 0.29mm/px · 3 of 68 slices shown]
[im 23/68  brain]
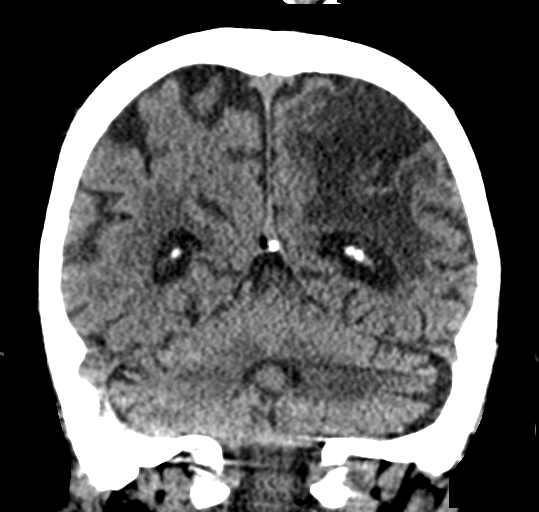
[im 30/68  brain]
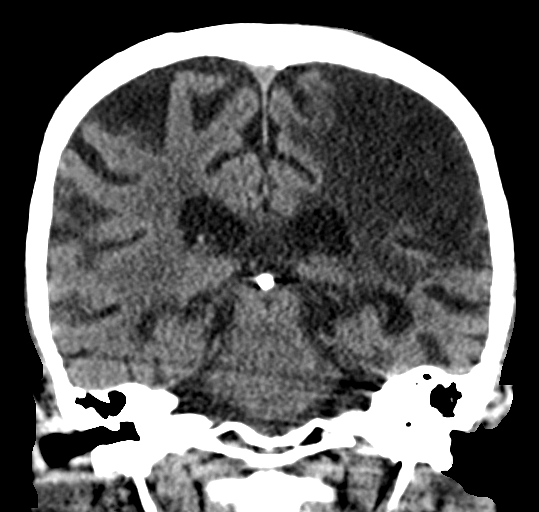
[im 38/68  brain]
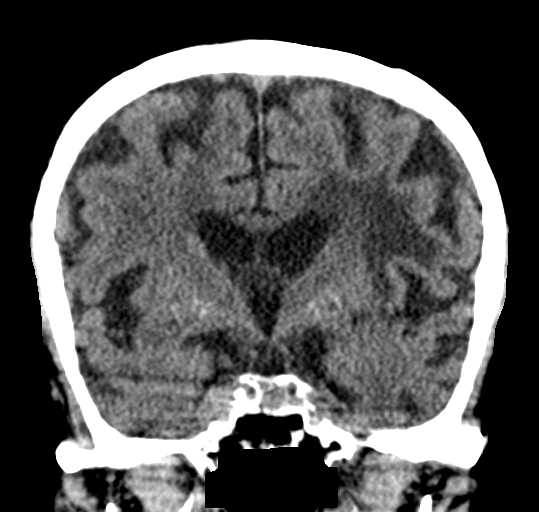

[Series 5: sagittal soft tissue · sagittal · 0.29mm/px · 3 of 54 slices shown]
[im 18/54  brain]
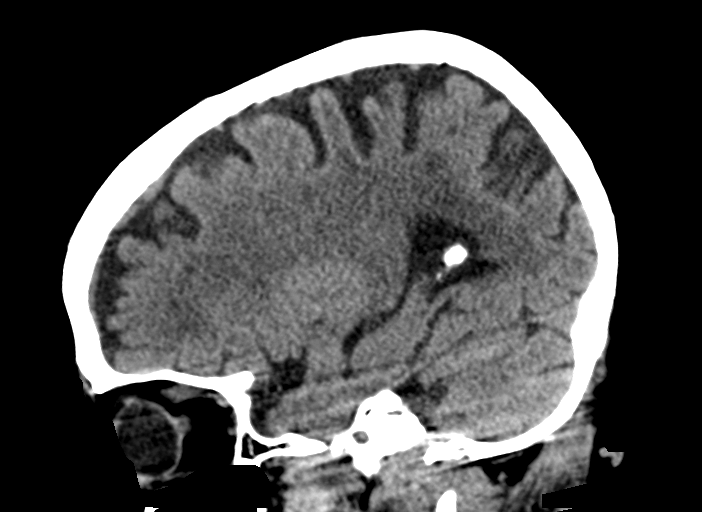
[im 27/54  brain]
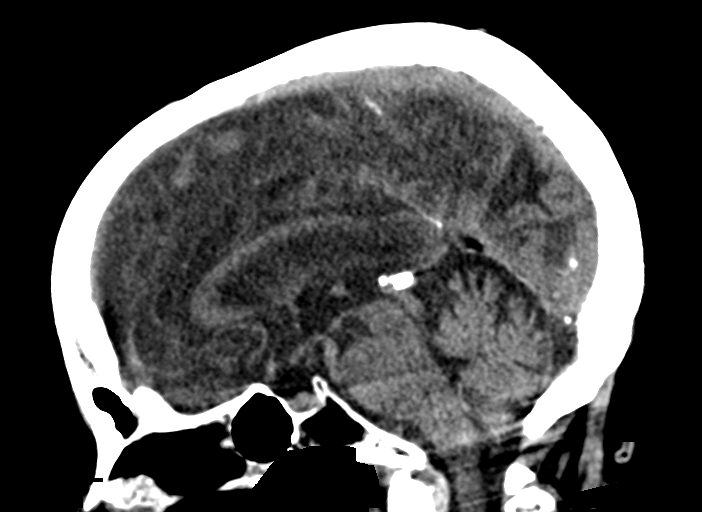
[im 36/54  brain]
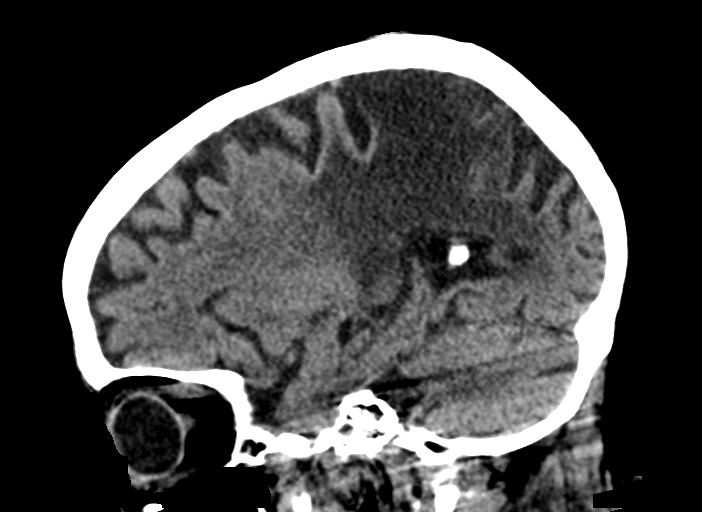

[15 of 47 positions shown; findings below may reference images not displayed]

FINDINGS: Brain: There is no evidence for acute hemorrhage, hydrocephalus,
mass lesion, or abnormal extra-axial fluid collection. No definite
CT evidence for acute infarction. Old posterior left MCA territory
infarct again noted. Diffuse loss of parenchymal volume is
consistent with atrophy. Patchy low attenuation in the deep
hemispheric and periventricular white matter is nonspecific, but
likely reflects chronic microvascular ischemic demyelination.

Vascular: No hyperdense vessel or unexpected calcification.

Skull: No evidence for fracture. No worrisome lytic or sclerotic
lesion.

Sinuses/Orbits: The visualized paranasal sinuses and mastoid air
cells are clear. Visualized portions of the globes and intraorbital
fat are unremarkable.

Other: None.
IMPRESSION: 1. Stable exam.  No acute intracranial abnormality.
2. Atrophy with chronic small vessel white matter ischemic disease.
3. Old posterior left MCA territory infarct.

## 2019-05-06 IMAGING — DX DG CHEST 1V PORT
1 series · 1 of 1 positions shown · non-contrast
Comparison: 08/18/2015

CLINICAL DATA: Seizures, weakness, fever

EXAM:
PORTABLE CHEST 1 VIEW

[chest ap]
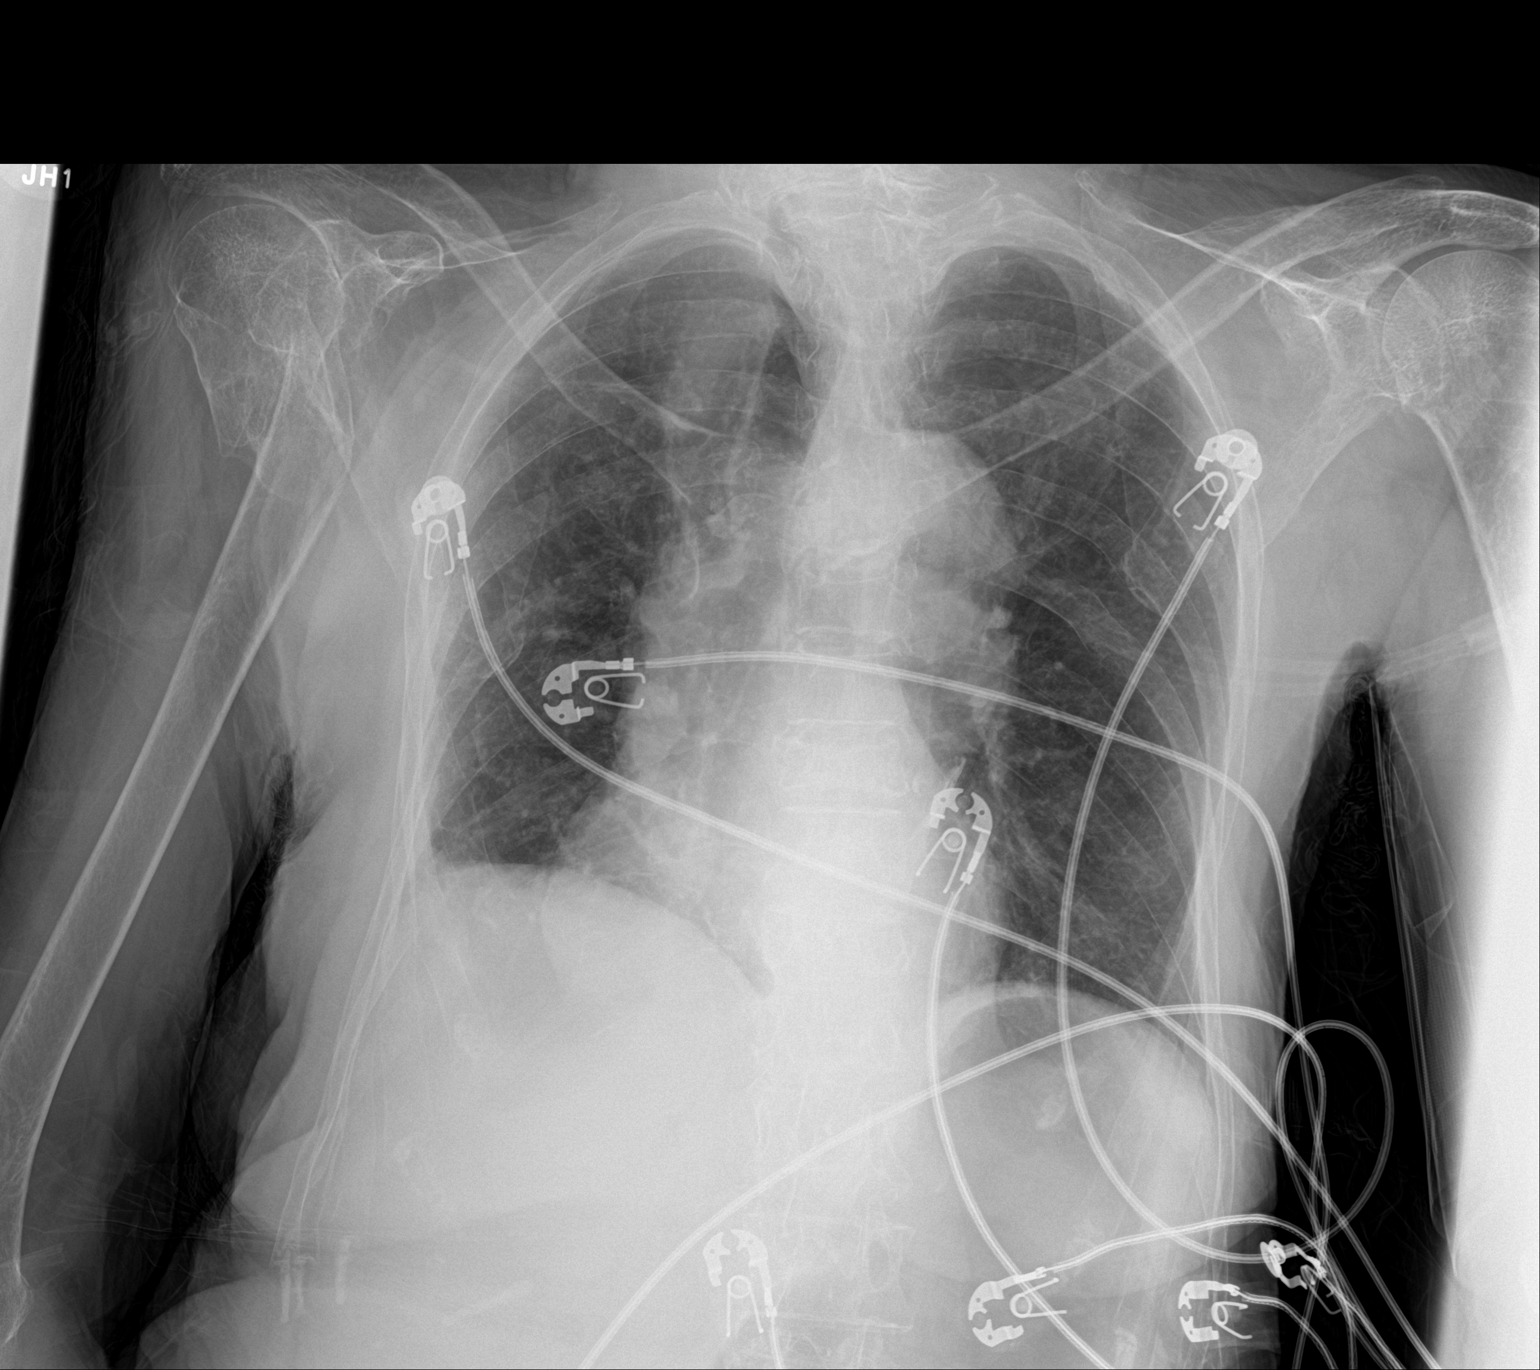

[1 of 1 positions shown; findings below may reference images not displayed]

FINDINGS: There is no focal parenchymal opacity. There is no pleural effusion
or pneumothorax. The heart and mediastinal contours are
unremarkable.

There is old posttraumatic deformity of the right proximal humerus.
IMPRESSION: No active disease.

## 2020-09-16 DEATH — deceased
